# Patient Record
Sex: Female | Born: 1990 | Race: Black or African American | Hispanic: No | Marital: Single | Smoking: Former smoker
Health system: Southern US, Community
[De-identification: ages and names within clinical notes are randomized; demographics above are authoritative.]

## PROBLEM LIST (undated history)

## (undated) ENCOUNTER — Ambulatory Visit: Discharge status: Against Medical Advice | Payer: BC Managed Care – PPO

## (undated) DIAGNOSIS — F419 Anxiety disorder, unspecified: Secondary | ICD-10-CM

## (undated) DIAGNOSIS — Z22322 Carrier or suspected carrier of Methicillin resistant Staphylococcus aureus: Secondary | ICD-10-CM

## (undated) DIAGNOSIS — R12 Heartburn: Secondary | ICD-10-CM

## (undated) DIAGNOSIS — L299 Pruritus, unspecified: Secondary | ICD-10-CM

## (undated) DIAGNOSIS — K219 Gastro-esophageal reflux disease without esophagitis: Secondary | ICD-10-CM

## (undated) HISTORY — DX: Heartburn: R12

## (undated) HISTORY — DX: Morbid (severe) obesity due to excess calories: E66.01

## (undated) HISTORY — PX: DILATION AND CURETTAGE OF UTERUS: SHX78

## (undated) HISTORY — DX: Anxiety disorder, unspecified: F41.9

## (undated) HISTORY — PX: INCISION AND DRAINAGE: SHX5863

## (undated) HISTORY — DX: Pruritus, unspecified: L29.9

---

## 2010-09-06 ENCOUNTER — Ambulatory Visit (HOSPITAL_COMMUNITY)
Admission: RE | Admit: 2010-09-06 | Discharge: 2010-09-06 | Disposition: A | Discharge status: Home / Self Care | Payer: Self-pay | Source: Ambulatory Visit | Attending: Chiropractic Medicine | Admitting: Chiropractic Medicine

## 2010-09-06 ENCOUNTER — Other Ambulatory Visit (HOSPITAL_COMMUNITY): Payer: Self-pay | Admitting: Chiropractic Medicine

## 2010-09-06 DIAGNOSIS — R52 Pain, unspecified: Secondary | ICD-10-CM

## 2010-09-06 DIAGNOSIS — M542 Cervicalgia: Secondary | ICD-10-CM | POA: Insufficient documentation

## 2014-06-02 ENCOUNTER — Emergency Department (HOSPITAL_BASED_OUTPATIENT_CLINIC_OR_DEPARTMENT_OTHER)
Admission: EM | Admit: 2014-06-02 | Discharge: 2014-06-02 | Disposition: A | Discharge status: Home / Self Care | Payer: Medicaid Other | Attending: Emergency Medicine | Admitting: Emergency Medicine

## 2014-06-02 ENCOUNTER — Encounter (HOSPITAL_BASED_OUTPATIENT_CLINIC_OR_DEPARTMENT_OTHER): Payer: Self-pay | Admitting: *Deleted

## 2014-06-02 DIAGNOSIS — J029 Acute pharyngitis, unspecified: Secondary | ICD-10-CM | POA: Diagnosis not present

## 2014-06-02 DIAGNOSIS — Z3202 Encounter for pregnancy test, result negative: Secondary | ICD-10-CM | POA: Diagnosis not present

## 2014-06-02 DIAGNOSIS — M545 Low back pain: Secondary | ICD-10-CM | POA: Insufficient documentation

## 2014-06-02 DIAGNOSIS — K649 Unspecified hemorrhoids: Secondary | ICD-10-CM | POA: Insufficient documentation

## 2014-06-02 DIAGNOSIS — Z8614 Personal history of Methicillin resistant Staphylococcus aureus infection: Secondary | ICD-10-CM | POA: Insufficient documentation

## 2014-06-02 DIAGNOSIS — B370 Candidal stomatitis: Secondary | ICD-10-CM | POA: Diagnosis not present

## 2014-06-02 HISTORY — DX: Carrier or suspected carrier of methicillin resistant Staphylococcus aureus: Z22.322

## 2014-06-02 LAB — URINALYSIS, ROUTINE W REFLEX MICROSCOPIC
Bilirubin Urine: NEGATIVE
GLUCOSE, UA: NEGATIVE mg/dL
HGB URINE DIPSTICK: NEGATIVE
KETONES UR: 15 mg/dL — AB
Leukocytes, UA: NEGATIVE
Nitrite: NEGATIVE
PROTEIN: NEGATIVE mg/dL
Specific Gravity, Urine: 1.035 — ABNORMAL HIGH (ref 1.005–1.030)
Urobilinogen, UA: 1 mg/dL (ref 0.0–1.0)
pH: 6 (ref 5.0–8.0)

## 2014-06-02 LAB — PREGNANCY, URINE: Preg Test, Ur: NEGATIVE

## 2014-06-02 MED ORDER — IBUPROFEN 800 MG PO TABS
800.0000 mg | ORAL_TABLET | Freq: Once | ORAL | Status: AC
  Administered 2014-06-02: 800 mg via ORAL
  Filled 2014-06-02: qty 1

## 2014-06-02 MED ORDER — HYDROCORTISONE 2.5 % RE CREA: TOPICAL_CREAM | RECTAL | Status: DC

## 2014-06-02 MED ORDER — HYDROCODONE-ACETAMINOPHEN 5-325 MG PO TABS: 2.0000 | ORAL_TABLET | ORAL | Status: DC | PRN

## 2014-06-02 MED ORDER — FLUCONAZOLE 200 MG PO TABS: 200.0000 mg | ORAL_TABLET | Freq: Every day | ORAL | Status: AC

## 2014-06-02 MED ORDER — HYDROCODONE-ACETAMINOPHEN 5-325 MG PO TABS: 2.0000 | ORAL_TABLET | Freq: Once | ORAL | Status: DC

## 2014-06-02 NOTE — ED Notes (Signed)
Pt c/o Hemorid pain x 1 week

## 2014-06-02 NOTE — Discharge Instructions (Signed)

## 2014-06-02 NOTE — ED Provider Notes (Signed)
CSN: [id number]     Arrival date & time 06/02/14  1804 History   First MD Initiated Contact with Patient 06/02/14 1836     Chief Complaint  Patient presents with  . Hemorrhoids     (Consider location/radiation/quality/duration/timing/severity/associated sxs/prior Treatment) Patient is a 23 y.o. female presenting with pharyngitis. The history is provided by the patient. No language interpreter was used.  Sore Throat This is a new problem. The current episode started in the past 7 days. The problem occurs constantly. The problem has been unchanged. Pertinent negatives include no vomiting. Nothing aggravates the symptoms. She has tried nothing for the symptoms. The treatment provided no relief.   patient has multiple complaints she reports she feels like she has thrush on her tongue. Patient has had thrush in the past and feels the same. Patient reports she phased a prescription of antibiotics 3 weeks ago. Patient also complains of soreness in right side and right low back. Patient reports pain is worse when she moves. Patient also complains of an exacerbation of hemorrhoids. She has had hemorrhoids in the past. Patient reports hemorrhoids or worse than usual  Past Medical History  Diagnosis Date  . MRSA (methicillin resistant staph aureus) culture positive    History reviewed. No pertinent past surgical history. History reviewed. No pertinent family history. History  Substance Use Topics  . Smoking status: Never Smoker   . Smokeless tobacco: Not on file  . Alcohol Use: No   OB History    No data available     Review of Systems  Gastrointestinal: Negative for vomiting.  All other systems reviewed and are negative.     Allergies  Review of patient's allergies indicates no known allergies.  Home Medications   Prior to Admission medications   Not on File   BP 133/73 mmHg  Pulse 120  Temp(Src) 98.6 F (37 C) (Oral)  Resp 16  Ht 4\' 9"  (1.448 m)  Wt 230 lb (104.327 kg)   BMI 49.76 kg/m2  SpO2 100% Physical Exam  Constitutional: She is oriented to person, place, and time. She appears well-developed and well-nourished.  HENT:  Head: Normocephalic.  Tongue coated white throat is clear  Eyes: EOM are normal.  Neck: Normal range of motion.  Cardiovascular: Normal rate and normal heart sounds.   Pulmonary/Chest: Effort normal.  Abdominal: Soft. She exhibits no distension.  Genitourinary:  2 small hemorrhoids  Musculoskeletal:  Nontender abdomen nontender lumbar and thoracic spine and is reproducible by range of motion of low back.  Neurological: She is alert and oriented to person, place, and time.  Skin: Skin is warm.  Psychiatric: She has a normal mood and affect.  Nursing note and vitals reviewed.   ED Course  Procedures (including critical care time) Labs Review Labs Reviewed  URINALYSIS, ROUTINE W REFLEX MICROSCOPIC - Abnormal; Notable for the following:    Specific Gravity, Urine 1.035 (*)    Ketones, ur 15 (*)    All other components within normal limits  PREGNANCY, URINE    Imaging Review No results found.   EKG Interpretation None      MDM urine pregnancy is negative urinalysis is negative with the exception of 15 ketones. I counseled patient I will treat hemorrhoids with Anusol HC. She is given a prescription for Diflucan for thrush she is given a prescription for pain medicine.    Final diagnoses:  Hemorrhoids, unspecified hemorrhoid type    Patient advised to see her physician for recheck in one week  Lonia SkinnerLeslie K McKeansburgSofia, PA-C 06/02/14 2111  Mirian MoMatthew Gentry, MD 06/07/14 [phone]

## 2014-07-02 ENCOUNTER — Encounter (HOSPITAL_BASED_OUTPATIENT_CLINIC_OR_DEPARTMENT_OTHER): Payer: Self-pay | Admitting: *Deleted

## 2014-07-02 ENCOUNTER — Emergency Department (HOSPITAL_BASED_OUTPATIENT_CLINIC_OR_DEPARTMENT_OTHER)
Admission: EM | Admit: 2014-07-02 | Discharge: 2014-07-02 | Disposition: A | Discharge status: Home / Self Care | Payer: Medicaid Other | Attending: Emergency Medicine | Admitting: Emergency Medicine

## 2014-07-02 DIAGNOSIS — Z79899 Other long term (current) drug therapy: Secondary | ICD-10-CM | POA: Insufficient documentation

## 2014-07-02 DIAGNOSIS — Z8614 Personal history of Methicillin resistant Staphylococcus aureus infection: Secondary | ICD-10-CM | POA: Insufficient documentation

## 2014-07-02 DIAGNOSIS — L0231 Cutaneous abscess of buttock: Secondary | ICD-10-CM | POA: Insufficient documentation

## 2014-07-02 DIAGNOSIS — L089 Local infection of the skin and subcutaneous tissue, unspecified: Secondary | ICD-10-CM | POA: Diagnosis present

## 2014-07-02 DIAGNOSIS — Z7952 Long term (current) use of systemic steroids: Secondary | ICD-10-CM | POA: Diagnosis not present

## 2014-07-02 MED ORDER — OXYCODONE-ACETAMINOPHEN 5-325 MG PO TABS: 1.0000 | ORAL_TABLET | Freq: Four times a day (QID) | ORAL | Status: DC | PRN

## 2014-07-02 MED ORDER — SULFAMETHOXAZOLE-TRIMETHOPRIM 800-160 MG PO TABS: 1.0000 | ORAL_TABLET | Freq: Two times a day (BID) | ORAL | Status: DC

## 2014-07-02 NOTE — Discharge Instructions (Signed)
Return to the ED with any concerns including fever/chills, increased swelling, vomiting and not able to keep down antibiotics, decreased level of alertness/lethargy, or any other alarming symptoms

## 2014-07-02 NOTE — ED Notes (Signed)
Pt amb to room 10 with quick steady gait, reports "boil" to her left buttock x 3 weeks. Pt states over last few days has gotten larger, more painful, and some drainage noted. Denies any fevers or other c/o.

## 2014-07-02 NOTE — ED Provider Notes (Signed)
CSN: [id number]     Arrival date & time 07/02/14  [id number] History   First MD Initiated Contact with Patient 07/02/14 1002     Chief Complaint  Patient presents with  . Recurrent Skin Infections     (Consider location/radiation/quality/duration/timing/severity/associated sxs/prior Treatment) HPI  Pt presents with c/o boil on her left buttock.  She states the area has been present for approx 2 weeks, the area has just begun to drain- pus mixed with blood.  She has been using warm compresses.  No fever/chills.  No systemic symptoms.  Pain is constant, but worse with palpation. No pain with defecation.  There are no other associated systemic symptoms, there are no other alleviating or modifying factors.   Past Medical History  Diagnosis Date  . MRSA (methicillin resistant staph aureus) culture positive    History reviewed. No pertinent past surgical history. History reviewed. No pertinent family history. History  Substance Use Topics  . Smoking status: Never Smoker   . Smokeless tobacco: Not on file  . Alcohol Use: No   OB History    No data available     Review of Systems  ROS reviewed and all otherwise negative except for mentioned in HPI    Allergies  Review of patient's allergies indicates no known allergies.  Home Medications   Prior to Admission medications   Medication Sig Start Date End Date Taking? Authorizing Provider  HYDROcodone-acetaminophen (NORCO/VICODIN) 5-325 MG per tablet Take 2 tablets by mouth every 4 (four) hours as needed. 06/02/14   Elson AreasLeslie K Sofia, PA-C  hydrocortisone (ANUSOL-HC) 2.5 % rectal cream Apply rectally 2 times daily 06/02/14   Elson AreasLeslie K Sofia, PA-C  oxyCODONE-acetaminophen (PERCOCET/ROXICET) 5-325 MG per tablet Take 1-2 tablets by mouth every 6 (six) hours as needed for severe pain. 07/02/14   Ethelda ChickMartha K Linker, MD  sulfamethoxazole-trimethoprim (SEPTRA DS) 800-160 MG per tablet Take 1 tablet by mouth every 12 (twelve) hours. 07/02/14   Ethelda ChickMartha K  Linker, MD   BP 130/73 mmHg  Pulse 81  Temp(Src) 99 F (37.2 C) (Oral)  Resp 18  Ht 5' (1.524 m)  Wt 230 lb (104.327 kg)  BMI 44.92 kg/m2  SpO2 97%  Vitals reviewed Physical Exam  Physical Examination: General appearance - alert, well appearing, and in no distress Mental status - alert, oriented to person, place, and time Eyes -  No conjunctival injection, no scleral icterus Chest - clear to auscultation, no wheezes, rales or rhonchi, symmetric air entry Heart - normal rate, regular rhythm, normal S1, S2, no murmurs, rubs, clicks or gallops Abdomen - soft, nontender, nondistended, no masses or organomegaly Back exam - full range of motion, no tenderness, palpable spasm or pain on motion Extremities - peripheral pulses normal, no pedal edema, no clubbing or cyanosis Skin - normal coloration and turgor, no rashes, approx 1cm erythematous area on left buttock- area is firm, actively drainage pus and blood, tender to palpation  ED Course  Procedures (including critical care time) Labs Review Labs Reviewed - No data to display  Imaging Review No results found.   EKG Interpretation None      MDM   Final diagnoses:  Abscess of left buttock    Pt presenting with c/o abscess on left buttock- actively draining small amount of pus. No fever or systemic symptoms.  Some overlying cellulitis.  Pt given pain meds and antibiotics, she is to continue using warm compresses.  Discharged with strict return precautions.  Pt agreeable with plan.  Ethelda ChickMartha K Linker, MD 07/03/14 [phone]

## 2016-08-23 ENCOUNTER — Emergency Department (HOSPITAL_COMMUNITY)
Admission: EM | Admit: 2016-08-23 | Discharge: 2016-08-23 | Disposition: A | Discharge status: Home / Self Care | Payer: Medicaid Other | Attending: Emergency Medicine | Admitting: Emergency Medicine

## 2016-08-23 ENCOUNTER — Encounter (HOSPITAL_COMMUNITY): Payer: Self-pay | Admitting: Emergency Medicine

## 2016-08-23 DIAGNOSIS — R1012 Left upper quadrant pain: Secondary | ICD-10-CM

## 2016-08-23 DIAGNOSIS — R350 Frequency of micturition: Secondary | ICD-10-CM | POA: Insufficient documentation

## 2016-08-23 DIAGNOSIS — F17210 Nicotine dependence, cigarettes, uncomplicated: Secondary | ICD-10-CM | POA: Insufficient documentation

## 2016-08-23 DIAGNOSIS — K5900 Constipation, unspecified: Secondary | ICD-10-CM | POA: Insufficient documentation

## 2016-08-23 DIAGNOSIS — R110 Nausea: Secondary | ICD-10-CM | POA: Insufficient documentation

## 2016-08-23 DIAGNOSIS — R1032 Left lower quadrant pain: Secondary | ICD-10-CM | POA: Insufficient documentation

## 2016-08-23 LAB — COMPREHENSIVE METABOLIC PANEL
ALBUMIN: 4.1 g/dL (ref 3.5–5.0)
ALK PHOS: 68 U/L (ref 38–126)
ALT: 17 U/L (ref 14–54)
ANION GAP: 9 (ref 5–15)
AST: 19 U/L (ref 15–41)
BILIRUBIN TOTAL: 0.6 mg/dL (ref 0.3–1.2)
BUN: 8 mg/dL (ref 6–20)
CO2: 24 mmol/L (ref 22–32)
Calcium: 9.3 mg/dL (ref 8.9–10.3)
Chloride: 106 mmol/L (ref 101–111)
Creatinine, Ser: 0.54 mg/dL (ref 0.44–1.00)
GFR calc Af Amer: >60 mL/min (ref >60)
GFR calc non Af Amer: >60 mL/min (ref >60)
Glucose, Bld: 89 mg/dL (ref 65–99)
POTASSIUM: 3.7 mmol/L (ref 3.5–5.1)
SODIUM: 139 mmol/L (ref 135–145)
Total Protein: 7.5 g/dL (ref 6.5–8.1)

## 2016-08-23 LAB — URINALYSIS, ROUTINE W REFLEX MICROSCOPIC
Bilirubin Urine: NEGATIVE
Glucose, UA: NEGATIVE mg/dL
Hgb urine dipstick: NEGATIVE
Ketones, ur: NEGATIVE mg/dL
LEUKOCYTES UA: NEGATIVE
NITRITE: NEGATIVE
Protein, ur: NEGATIVE mg/dL
SPECIFIC GRAVITY, URINE: 1.028 (ref 1.005–1.030)
pH: 6 (ref 5.0–8.0)

## 2016-08-23 LAB — CBC
HCT: 40.7 % (ref 36.0–46.0)
HEMOGLOBIN: 13.7 g/dL (ref 12.0–15.0)
MCH: 31.7 pg (ref 26.0–34.0)
MCHC: 33.7 g/dL (ref 30.0–36.0)
MCV: 94.2 fL (ref 78.0–100.0)
Platelets: 317 10*3/uL (ref 150–400)
RBC: 4.32 MIL/uL (ref 3.87–5.11)
RDW: 12.6 % (ref 11.5–15.5)
WBC: 7.6 10*3/uL (ref 4.0–10.5)

## 2016-08-23 LAB — PREGNANCY, URINE: PREG TEST UR: NEGATIVE

## 2016-08-23 LAB — LIPASE, BLOOD: Lipase: 11 U/L (ref 11–51)

## 2016-08-23 MED ORDER — TRAMADOL HCL 50 MG PO TABS: 50.0000 mg | ORAL_TABLET | Freq: Four times a day (QID) | ORAL | 0 refills | Status: DC | PRN

## 2016-08-23 MED ORDER — IBUPROFEN 600 MG PO TABS: 600.0000 mg | ORAL_TABLET | Freq: Four times a day (QID) | ORAL | 0 refills | Status: DC

## 2016-08-23 MED ORDER — IBUPROFEN 800 MG PO TABS
800.0000 mg | ORAL_TABLET | Freq: Once | ORAL | Status: AC
  Administered 2016-08-23: 800 mg via ORAL
  Filled 2016-08-23: qty 1

## 2016-08-23 MED ORDER — TRAMADOL HCL 50 MG PO TABS
100.0000 mg | ORAL_TABLET | Freq: Once | ORAL | Status: AC
  Administered 2016-08-23: 100 mg via ORAL
  Filled 2016-08-23: qty 2

## 2016-08-23 MED ORDER — ONDANSETRON HCL 4 MG PO TABS
4.0000 mg | ORAL_TABLET | Freq: Once | ORAL | Status: AC
  Administered 2016-08-23: 4 mg via ORAL
  Filled 2016-08-23: qty 1

## 2016-08-23 NOTE — ED Provider Notes (Signed)
[facility] Provider Note   CSN: [id number] Arrival date & time: 08/23/16  1721     History   Chief Complaint Chief Complaint  Patient presents with  . Abdominal Pain    HPI Alexis Berg is a 26 y.o. female.  Patients describe the abdomen pain as a cramping sharp type pain. The pain is mostly in the left lower quadrant moving to the left lower back.  The patient reports she had her last menstrual cycle 2 weeks ago. It is of note however that her menses are irregular due to Depo-Provera.   The history is provided by the patient.  Abdominal Pain   This is a new problem. The current episode started more than 2 days ago. The problem occurs hourly. The problem has been gradually worsening. The pain is associated with an unknown factor. The pain is located in the LLQ (left lower back). The pain is moderate. Associated symptoms include nausea, constipation, frequency and myalgias. Pertinent negatives include fever, belching, diarrhea, vomiting, dysuria, hematuria and arthralgias. The symptoms are aggravated by certain positions. Nothing relieves the symptoms. Her past medical history does not include PUD, gallstones, ulcerative colitis, Crohn's disease or irritable bowel syndrome.    Past Medical History:  Diagnosis Date  . MRSA (methicillin resistant staph aureus) culture positive     There are no active problems to display for this patient.   History reviewed. No pertinent surgical history.  OB History    No data available       Home Medications    Prior to Admission medications   Medication Sig Start Date End Date Taking? Authorizing Provider  HYDROcodone-acetaminophen (NORCO/VICODIN) 5-325 MG per tablet Take 2 tablets by mouth every 4 (four) hours as needed. 06/02/14   Elson AreasLeslie K Sofia, PA-C  hydrocortisone (ANUSOL-HC) 2.5 % rectal cream Apply rectally 2 times daily 06/02/14   Elson AreasLeslie K Sofia, PA-C  oxyCODONE-acetaminophen (PERCOCET/ROXICET) 5-325 MG per  tablet Take 1-2 tablets by mouth every 6 (six) hours as needed for severe pain. 07/02/14   Jerelyn ScottMartha Linker, MD  sulfamethoxazole-trimethoprim (SEPTRA DS) 800-160 MG per tablet Take 1 tablet by mouth every 12 (twelve) hours. 07/02/14   Jerelyn ScottMartha Linker, MD    Family History History reviewed. No pertinent family history.  Social History Social History  Substance Use Topics  . Smoking status: Current Every Day Smoker    Packs/day: 0.50    Types: Cigarettes  . Smokeless tobacco: Never Used  . Alcohol use No     Allergies   Patient has no known allergies.   Review of Systems Review of Systems  Constitutional: Negative for activity change and fever.       All ROS Neg except as noted in HPI  HENT: Negative for nosebleeds.   Eyes: Negative for photophobia and discharge.  Respiratory: Negative for cough, shortness of breath and wheezing.   Cardiovascular: Negative for chest pain and palpitations.  Gastrointestinal: Positive for abdominal pain, constipation and nausea. Negative for blood in stool, diarrhea and vomiting.  Genitourinary: Positive for frequency. Negative for dysuria and hematuria.  Musculoskeletal: Positive for myalgias. Negative for arthralgias, back pain and neck pain.  Skin: Negative.   Neurological: Negative for dizziness, seizures and speech difficulty.  Psychiatric/Behavioral: Negative for confusion and hallucinations.     Physical Exam Updated Vital Signs BP 122/79 (BP Location: Right Arm)   Pulse 97   Temp 98.2 F (36.8 C)   Resp 16   Ht 5' (1.524 m)   Wt 108.9 kg  LMP 08/07/2016   SpO2 100%   BMI 46.87 kg/m   Physical Exam  Constitutional: She is oriented to person, place, and time. She appears well-developed and well-nourished.  Non-toxic appearance.  HENT:  Head: Normocephalic.  Right Ear: Tympanic membrane and external ear normal.  Left Ear: Tympanic membrane and external ear normal.  Eyes: EOM and lids are normal. Pupils are equal, round, and  reactive to light.  Neck: Normal range of motion. Neck supple. Carotid bruit is not present.  Cardiovascular: Normal rate, regular rhythm, normal heart sounds, intact distal pulses and normal pulses.   Pulmonary/Chest: Breath sounds normal. No respiratory distress.  Abdominal: Soft. Bowel sounds are normal. There is tenderness in the suprapubic area and left lower quadrant. There is no guarding.    Musculoskeletal: Normal range of motion.  Lymphadenopathy:       Head (right side): No submandibular adenopathy present.       Head (left side): No submandibular adenopathy present.    She has no cervical adenopathy.  Neurological: She is alert and oriented to person, place, and time. She has normal strength. No cranial nerve deficit or sensory deficit.  Skin: Skin is warm and dry.  Psychiatric: She has a normal mood and affect. Her speech is normal.  Nursing note and vitals reviewed.    ED Treatments / Results  Labs (all labs ordered are listed, but only abnormal results are displayed) Labs Reviewed  URINALYSIS, ROUTINE W REFLEX MICROSCOPIC - Abnormal; Notable for the following:       Result Value   APPearance HAZY (*)    All other components within normal limits  CBC  PREGNANCY, URINE  LIPASE, BLOOD  COMPREHENSIVE METABOLIC PANEL    EKG  EKG Interpretation None       Radiology No results found.  Procedures Procedures (including critical care time)  Medications Ordered in ED Medications - No data to display   Initial Impression / Assessment and Plan / ED Course  I have reviewed the triage vital signs and the nursing notes.  Pertinent labs & imaging results that were available during my care of the patient were reviewed by me and considered in my medical decision making (see chart for details).     **I have reviewed nursing notes, vital signs, and all appropriate lab and imaging results for this patient.*  Final Clinical Impressions(s) / ED Diagnoses  MDM Vital  signs within normal limits. The lipase is normal at 11. Comments of metabolic panel is normal. Complete blood count is well within normal limits. Urinalysis is negative for infection or evidence of kidney stone or other abnormality. Urine pregnancy test is negative.  I suspect that this is either a musculoskeletal pain, or hormonal related he specially with the patient being on Depo-Provera. The patient will be treated with ibuprofen and Ultram for discomfort. I've asked her to see her GYN physician for additional evaluation if this is not improving, or to return to the emergency department if any emergent changes, problems, or concerns.    Final diagnoses:  None    New Prescriptions New Prescriptions   No medications on file     Ivery Quale, Cordelia Poche 08/23/16 2129    Mancel Bale, MD 08/24/16 1946

## 2016-08-23 NOTE — ED Triage Notes (Signed)
Pt reports intermittent abd cramps. Pt denies any dysuria,hematuria, vaginal bleeding,v/d. nad noted. LMP x2 weeks ago.

## 2016-08-23 NOTE — ED Notes (Signed)
Pt ambulatory to waiting room. Pt verbalized understanding of discharge instructions.   

## 2016-08-23 NOTE — Discharge Instructions (Signed)
Your vital signs within normal limits. Your chemistries are normal. Your urine is negative for infection or kidney stone. Your pregnancy test is negative. Please use ibuprofen 4 times daily with meals and at bedtime, use Ultram every 6 hours for more severe pain. See your GYN physician for additional evaluation and management if this is not improving. Return to the emergency department if any emergent changes, problems, or concerns.

## 2016-11-06 ENCOUNTER — Encounter (HOSPITAL_COMMUNITY): Payer: Self-pay | Admitting: Cardiology

## 2016-11-06 ENCOUNTER — Emergency Department (HOSPITAL_COMMUNITY)
Admission: EM | Admit: 2016-11-06 | Discharge: 2016-11-06 | Disposition: A | Discharge status: Home / Self Care | Payer: Medicaid Other | Attending: Emergency Medicine | Admitting: Emergency Medicine

## 2016-11-06 DIAGNOSIS — A599 Trichomoniasis, unspecified: Secondary | ICD-10-CM

## 2016-11-06 DIAGNOSIS — Z87891 Personal history of nicotine dependence: Secondary | ICD-10-CM | POA: Insufficient documentation

## 2016-11-06 DIAGNOSIS — Z79899 Other long term (current) drug therapy: Secondary | ICD-10-CM | POA: Insufficient documentation

## 2016-11-06 DIAGNOSIS — R300 Dysuria: Secondary | ICD-10-CM

## 2016-11-06 DIAGNOSIS — Z791 Long term (current) use of non-steroidal anti-inflammatories (NSAID): Secondary | ICD-10-CM | POA: Insufficient documentation

## 2016-11-06 LAB — URINALYSIS, ROUTINE W REFLEX MICROSCOPIC
BILIRUBIN URINE: NEGATIVE
Bacteria, UA: NONE SEEN
Glucose, UA: NEGATIVE mg/dL
Hgb urine dipstick: NEGATIVE
Ketones, ur: NEGATIVE mg/dL
Nitrite: NEGATIVE
Protein, ur: NEGATIVE mg/dL
SPECIFIC GRAVITY, URINE: 1.024 (ref 1.005–1.030)
pH: 5 (ref 5.0–8.0)

## 2016-11-06 LAB — PREGNANCY, URINE: PREG TEST UR: NEGATIVE

## 2016-11-06 LAB — WET PREP, GENITAL
SPERM: NONE SEEN
Yeast Wet Prep HPF POC: NONE SEEN

## 2016-11-06 MED ORDER — LIDOCAINE HCL (PF) 1 % IJ SOLN
INTRAMUSCULAR | Status: AC
  Filled 2016-11-06: qty 5

## 2016-11-06 MED ORDER — METRONIDAZOLE 500 MG PO TABS
2000.0000 mg | ORAL_TABLET | Freq: Once | ORAL | Status: AC
  Administered 2016-11-06: 2000 mg via ORAL
  Filled 2016-11-06: qty 4

## 2016-11-06 MED ORDER — AZITHROMYCIN 250 MG PO TABS
1000.0000 mg | ORAL_TABLET | Freq: Once | ORAL | Status: AC
  Administered 2016-11-06: 1000 mg via ORAL
  Filled 2016-11-06: qty 4

## 2016-11-06 MED ORDER — CEFTRIAXONE SODIUM 250 MG IJ SOLR
250.0000 mg | Freq: Once | INTRAMUSCULAR | Status: AC
  Administered 2016-11-06: 250 mg via INTRAMUSCULAR
  Filled 2016-11-06: qty 250

## 2016-11-06 NOTE — ED Provider Notes (Signed)
[facility] Provider Note   CSN: [id number] Arrival date & time: 11/06/16  1023     History   Chief Complaint Chief Complaint  Patient presents with  . Dysuria    HPI Alexis Berg is a 26 y.o. female.  HPI   Alexis DanceJohnele Alexis Berg is a 26 y.o. female who presents to the Emergency Department complaining of burning with urination, urinary frequency and vaginal itching for 3-4 days.  She states that she had unprotected sex with a new partner prior to onset of symptoms.  She states the vaginal itching is constant and worse after urination.  She denies abdominal pain, vaginal bleeding or discharge, rash, fever, back pain or chills.    Past Medical History:  Diagnosis Date  . MRSA (methicillin resistant staph aureus) culture positive     There are no active problems to display for this patient.   History reviewed. No pertinent surgical history.  OB History    No data available       Home Medications    Prior to Admission medications   Medication Sig Start Date End Date Taking? Authorizing Provider  ibuprofen (ADVIL,MOTRIN) 600 MG tablet Take 1 tablet (600 mg total) by mouth 4 (four) times daily. Patient not taking: Reported on 11/06/2016 08/23/16   Ivery QualeBryant, Hobson, PA-C  traMADol (ULTRAM) 50 MG tablet Take 1 tablet (50 mg total) by mouth every 6 (six) hours as needed. Patient not taking: Reported on 11/06/2016 08/23/16   Ivery QualeBryant, Hobson, PA-C    Family History History reviewed. No pertinent family history.  Social History Social History  Substance Use Topics  . Smoking status: Former Games developermoker  . Smokeless tobacco: Never Used  . Alcohol use No     Allergies   Patient has no known allergies.   Review of Systems Review of Systems  Constitutional: Negative for activity change, appetite change, chills and fever.  Respiratory: Negative for chest tightness and shortness of breath.   Gastrointestinal: Negative for abdominal pain, nausea and vomiting.    Genitourinary: Positive for dysuria, frequency and vaginal pain. Negative for decreased urine volume, difficulty urinating, flank pain, hematuria, urgency, vaginal bleeding and vaginal discharge.       Burning with urination.  Musculoskeletal: Negative for back pain.  Skin: Negative for rash.  Neurological: Negative for dizziness, weakness and numbness.  Hematological: Negative for adenopathy.  Psychiatric/Behavioral: Negative for confusion.  All other systems reviewed and are negative.    Physical Exam Updated Vital Signs BP 124/80   Pulse 71   Temp 98.3 F (36.8 C) (Oral)   Resp 16   Ht 5' (1.524 m)   Wt 103.9 kg   LMP 10/31/2016   SpO2 99%   BMI 44.72 kg/m   Physical Exam  Constitutional: She is oriented to person, place, and time. She appears well-developed and well-nourished. No distress.  HENT:  Head: Normocephalic and atraumatic.  Cardiovascular: Normal rate, regular rhythm and intact distal pulses.   No murmur heard. Pulmonary/Chest: Effort normal and breath sounds normal. No respiratory distress. She has no wheezes. She has no rales.  Abdominal: Soft. Normal appearance. She exhibits no distension and no mass. There is no hepatosplenomegaly. There is no tenderness. There is no rigidity, no rebound, no guarding, no CVA tenderness and no tenderness at McBurney's point.  abdomen is soft, non-tender without guarding or rebound tenderness. No CVA tenderness  Genitourinary: Vagina normal and uterus normal. There is no rash or lesion on the right labia. There is no rash or lesion  on the left labia. Cervix exhibits no motion tenderness and no discharge. Right adnexum displays no mass and no tenderness. Left adnexum displays no mass and no tenderness. No tenderness or bleeding in the vagina. No foreign body in the vagina. No vaginal discharge found.  Genitourinary Comments: Exam chaperoned by nursing.  Small amt of white milky vaginal discharge.  No CMT, no adnexal masses or  tenderness  Musculoskeletal: Normal range of motion. She exhibits no edema.  Neurological: She is alert and oriented to person, place, and time. Coordination normal.  Skin: Skin is warm and dry. No rash noted.  Psychiatric: She has a normal mood and affect.  Nursing note and vitals reviewed.    ED Treatments / Results  Labs (all labs ordered are listed, but only abnormal results are displayed) Labs Reviewed  WET PREP, GENITAL - Abnormal; Notable for the following:       Result Value   Trich, Wet Prep PRESENT (*)    Clue Cells Wet Prep HPF POC PRESENT (*)    WBC, Wet Prep HPF POC MANY (*)    All other components within normal limits  URINALYSIS, ROUTINE W REFLEX MICROSCOPIC - Abnormal; Notable for the following:    APPearance HAZY (*)    Leukocytes, UA MODERATE (*)    Squamous Epithelial / LPF 0-5 (*)    All other components within normal limits  URINE CULTURE  PREGNANCY, URINE  RPR  HIV ANTIBODY (ROUTINE TESTING)  GC/CHLAMYDIA PROBE AMP ([place]) NOT AT [facility]    EKG  EKG Interpretation None       Radiology No results found.  Procedures Procedures (including critical care time)  Medications Ordered in ED Medications  lidocaine (PF) (XYLOCAINE) 1 % injection (not administered)  cefTRIAXone (ROCEPHIN) injection 250 mg (250 mg Intramuscular Given 11/06/16 1325)  azithromycin (ZITHROMAX) tablet 1,000 mg (1,000 mg Oral Given 11/06/16 1325)  metroNIDAZOLE (FLAGYL) tablet 2,000 mg (2,000 mg Oral Given 11/06/16 1409)     Initial Impression / Assessment and Plan / ED Course  I have reviewed the triage vital signs and the nursing notes.  Pertinent labs & imaging results that were available during my care of the patient were reviewed by me and considered in my medical decision making (see chart for details).     Pt is well appearing.  Non-toxic.  abd is soft, NT.  Dysuria sx's likely related to STI, will tx with IM rocephin, po zithromax and flagyl.  Pt agrees to f/u with  health dept.  Urine culture pending.   Final Clinical Impressions(s) / ED Diagnoses   Final diagnoses:  Trichimoniasis  Dysuria    New Prescriptions Discharge Medication List as of 11/06/2016  2:02 PM       Pauline Aus, PA-C 11/06/16 1630    Eber Hong, MD 11/12/16 2023

## 2016-11-06 NOTE — Discharge Instructions (Signed)
Follow-up with the health dept for recheck if needed.  You will be contacted by the hospital if any of your remaining tests are positive.

## 2016-11-06 NOTE — ED Triage Notes (Signed)
Frequent urination,  Vaginal burning and itching times 3-4 days.

## 2016-11-07 LAB — URINE CULTURE

## 2016-11-07 LAB — GC/CHLAMYDIA PROBE AMP (~~LOC~~) NOT AT ARMC
Chlamydia: NEGATIVE
Neisseria Gonorrhea: NEGATIVE

## 2016-11-07 LAB — HIV ANTIBODY (ROUTINE TESTING W REFLEX): HIV SCREEN 4TH GENERATION: NONREACTIVE

## 2016-11-07 LAB — RPR: RPR Ser Ql: NONREACTIVE

## 2016-11-15 ENCOUNTER — Emergency Department (HOSPITAL_COMMUNITY): Payer: Self-pay

## 2016-11-15 ENCOUNTER — Emergency Department (HOSPITAL_COMMUNITY)
Admission: EM | Admit: 2016-11-15 | Discharge: 2016-11-15 | Disposition: A | Discharge status: Home / Self Care | Payer: Self-pay | Attending: Emergency Medicine | Admitting: Emergency Medicine

## 2016-11-15 ENCOUNTER — Encounter (HOSPITAL_COMMUNITY): Payer: Self-pay | Admitting: *Deleted

## 2016-11-15 DIAGNOSIS — B349 Viral infection, unspecified: Secondary | ICD-10-CM | POA: Insufficient documentation

## 2016-11-15 DIAGNOSIS — Z87891 Personal history of nicotine dependence: Secondary | ICD-10-CM | POA: Insufficient documentation

## 2016-11-15 DIAGNOSIS — R05 Cough: Secondary | ICD-10-CM

## 2016-11-15 DIAGNOSIS — R059 Cough, unspecified: Secondary | ICD-10-CM

## 2016-11-15 LAB — RAPID STREP SCREEN (MED CTR MEBANE ONLY): Streptococcus, Group A Screen (Direct): NEGATIVE

## 2016-11-15 MED ORDER — ONDANSETRON 4 MG PO TBDP
4.0000 mg | ORAL_TABLET | Freq: Once | ORAL | Status: AC
  Administered 2016-11-15: 4 mg via ORAL
  Filled 2016-11-15: qty 1

## 2016-11-15 MED ORDER — IBUPROFEN 800 MG PO TABS
800.0000 mg | ORAL_TABLET | Freq: Once | ORAL | Status: AC
  Administered 2016-11-15: 800 mg via ORAL
  Filled 2016-11-15: qty 1

## 2016-11-15 MED ORDER — ACETAMINOPHEN 500 MG PO TABS
500.0000 mg | ORAL_TABLET | Freq: Once | ORAL | Status: AC
  Administered 2016-11-15: 500 mg via ORAL
  Filled 2016-11-15: qty 1

## 2016-11-15 NOTE — ED Provider Notes (Signed)
The patient is a 26 year old female, presents with symptoms that started yesterday including sore throat, coughing, body aches. She has had several family members with strep throat. On exam the patient is sleeping comfortably, easily arousable and wakes up to have a normal mental status. She is minimally tachycardic, soft abdomen, mild tenderness in her muscles diffusely. She has normal phonation, moist mucous membranes, minimal erythema but no exudate asymmetry or hypertrophy to the posterior pharynx. Nasal passages are clear, there is no lymphadenopathy in her neck is extremely supple with no pain or tenderness with range of motion.  Rapid strep is negative, the patient is stable for discharge, she'll be treated with anti-inflammatories as an outpatient. She can follow-up closely, rapid strep culture has been sent.  Medical screening examination/treatment/procedure(s) were conducted as a shared visit with non-physician practitioner(s) and myself.  I personally evaluated the patient during the encounter.  Clinical Impression:   Final diagnoses:  Viral syndrome  Cough         Eber HongMiller, Mckenleigh Tarlton, MD 11/16/16 [phone]

## 2016-11-15 NOTE — Discharge Instructions (Signed)
Your chest xray and strep was negative. Continue taking ibuprofen for fever and pain. Follow up with your PCP within the next week for recheck of symptoms. You can return if symptoms worsen.

## 2016-11-15 NOTE — ED Provider Notes (Signed)
[facility]-EMERGENCY DEPT Provider Note   CSN: [id number] Arrival date & time: 11/15/16  2041   By signing my name below, I, Bobbie StackChristopher Reid, attest that this documentation has been prepared under the direction and in the presence of SPX CorporationMichael Vora Clover, PA-C. Electronically Signed: Bobbie Stackhristopher Reid, Scribe. 11/15/16. 9:23 PM. History   Chief Complaint Chief Complaint  Patient presents with  . Generalized Body Aches    The history is provided by the patient. No language interpreter was used.  HPI Comments: Alexis Berg is a 26 y.o. female who presents to the Emergency Department complaining of generalized body aches that began yesterday morning. She reports associated fever of tmax 102, sore throat, frontal headache with throbbing in character, back pain, centralized chest pain, and SOB that began yesterday. She also reports a productive cough of whitish-yellow sputum. She reports noticing some streaking blood in her cough this morning. She reports some nausea recently but no vomiting. She took 650 mg of tylenol yesterday and today with no relief. No intake since this morning. She states that her son had strep throat the other day and was treated. She states that she has been around family that lives in the same household which has been sick recently. She denies current tobacco use. She denies blood in stool or leg swelling recently.  Past Medical History:  Diagnosis Date  . MRSA (methicillin resistant staph aureus) culture positive     There are no active problems to display for this patient.   History reviewed. No pertinent surgical history.  OB History    No data available       Home Medications    Prior to Admission medications   Medication Sig Start Date End Date Taking? Authorizing Provider  ibuprofen (ADVIL,MOTRIN) 600 MG tablet Take 1 tablet (600 mg total) by mouth 4 (four) times daily. Patient not taking: Reported on 11/06/2016 08/23/16   Ivery QualeBryant, Hobson, PA-C  traMADol  (ULTRAM) 50 MG tablet Take 1 tablet (50 mg total) by mouth every 6 (six) hours as needed. Patient not taking: Reported on 11/06/2016 08/23/16   Ivery QualeBryant, Hobson, PA-C    Family History History reviewed. No pertinent family history.  Social History Social History  Substance Use Topics  . Smoking status: Former Games developermoker  . Smokeless tobacco: Never Used  . Alcohol use No     Allergies   Patient has no known allergies.   Review of Systems Review of Systems  Constitutional: Positive for fever.  HENT: Positive for sore throat.   Respiratory: Positive for cough and shortness of breath.   Cardiovascular: Positive for chest pain. Negative for leg swelling.  Gastrointestinal: Negative for blood in stool.  Musculoskeletal: Positive for myalgias (Generalized body aches).  Neurological: Positive for headaches.  All other systems reviewed and are negative.   Physical Exam Updated Vital Signs BP (!) 90/58 (BP Location: Left Arm)   Pulse (!) 109   Temp 99.3 F (37.4 C) (Oral)   Resp 19   Ht 5' (1.524 m)   Wt 103.9 kg   LMP 10/31/2016   SpO2 96%   BMI 44.72 kg/m   Physical Exam  Constitutional: She appears well-developed and well-nourished.  HENT:  Head: Normocephalic and atraumatic.  She has some erythema of the posterior pharynx. Normal TMs cone of light seen. No sinus tenderness. Shotty cervical lymphadenopathy.  Eyes: Conjunctivae are normal. Right eye exhibits no discharge. Left eye exhibits no discharge.  Cardiovascular: Regular rhythm and normal heart sounds.  Tachycardia present.   Hearts  sounds were normal but she is tachycardic. Radial pulses are equal bilaterally. PT pulses equal bilaterally.  Pulmonary/Chest: Effort normal. No respiratory distress. She has rhonchi. She exhibits tenderness.  TTP of the right upper side. Rhonchi bilaterally that doesn't clear with coughing.  Abdominal: Soft. Bowel sounds are normal. She exhibits no mass.  Abdomen is soft, non-tender. Normal  bowel sounds in all 4 quadrants. No masses.  Musculoskeletal:  No cervical tenderness. No pitting edema. Grossly moves all extremities.   Neurological: She is alert. Coordination normal.  Skin: Skin is warm and dry. No rash noted. She is not diaphoretic. No erythema.  Psychiatric: She has a normal mood and affect.  Nursing note and vitals reviewed.  ED Treatments / Results  DIAGNOSTIC STUDIES: Oxygen Saturation is 98% on RA, normal by my interpretation.    COORDINATION OF CARE: 8:59 PM Discussed treatment plan with pt at bedside and pt agreed to plan. I will check her labs.  Labs (all labs ordered are listed, but only abnormal results are displayed) Labs Reviewed  RAPID STREP SCREEN (NOT AT [facility])  CULTURE, GROUP A STREP [facility])    EKG  EKG Interpretation None       Radiology Dg Chest 2 View  Result Date: 11/15/2016 CLINICAL DATA:  Mid chest pain with productive cough EXAM: CHEST  2 VIEW COMPARISON:  None. FINDINGS: The heart size and mediastinal contours are within normal limits. Both lungs are clear. The visualized skeletal structures are unremarkable. IMPRESSION: No active cardiopulmonary disease. Electronically Signed   By: Jasmine Pang M.D.   On: 11/15/2016 21:57    Procedures Procedures (including critical care time)  Medications Ordered in ED Medications  ibuprofen (ADVIL,MOTRIN) tablet 800 mg (800 mg Oral Given 11/15/16 2142)  ondansetron (ZOFRAN-ODT) disintegrating tablet 4 mg (4 mg Oral Given 11/15/16 2142)  acetaminophen (TYLENOL) tablet 500 mg (500 mg Oral Given 11/15/16 2142)     Initial Impression / Assessment and Plan / ED Course  I have reviewed the triage vital signs and the nursing notes.  Pertinent labs & imaging results that were available during my care of the patient were reviewed by me and considered in my medical decision making (see chart for details).     As she has sick contacts at home, febrile a rapid strep was preformed. Her lung sounds  warranted a chest xray. Both results were negative. Ibuprofen and tylenol was given to reduce the patients fever during her stay in the emergency department. She was advised she could take ibuprofen or tylenol at home for pain and fever reduction. The patient was discharged home to follow up with PCP in 1 week. Return precautions given.    Final Clinical Impressions(s) / ED Diagnoses   Final diagnoses:  Viral syndrome  Cough    New Prescriptions Discharge Medication List as of 11/15/2016 10:40 PM     This note was generated with the assistant of a scribe. I have reviewed the documentation.    Princella Pellegrini 11/16/16 Reola Mosher    Eber Hong, MD 11/16/16 [phone]

## 2016-11-15 NOTE — ED Triage Notes (Signed)
Pt c/o generalized body aches that started yesterday

## 2016-11-18 LAB — CULTURE, GROUP A STREP (THRC)

## 2017-01-24 ENCOUNTER — Emergency Department (HOSPITAL_COMMUNITY)
Admission: EM | Admit: 2017-01-24 | Discharge: 2017-01-25 | Disposition: A | Discharge status: Home / Self Care | Payer: Medicaid Other | Attending: Emergency Medicine | Admitting: Emergency Medicine

## 2017-01-24 ENCOUNTER — Encounter (HOSPITAL_COMMUNITY): Payer: Self-pay | Admitting: Emergency Medicine

## 2017-01-24 DIAGNOSIS — Z87891 Personal history of nicotine dependence: Secondary | ICD-10-CM | POA: Insufficient documentation

## 2017-01-24 DIAGNOSIS — N390 Urinary tract infection, site not specified: Secondary | ICD-10-CM | POA: Insufficient documentation

## 2017-01-24 LAB — POC URINE PREG, ED: Preg Test, Ur: NEGATIVE

## 2017-01-24 NOTE — ED Triage Notes (Signed)
Pt c/o right flank pain with dysuria that started yesterday.

## 2017-01-24 NOTE — ED Provider Notes (Signed)
[facility] DE[facility] Provider Note   CSN: [id number] Arrival date & time: 01/24/17  2251     History   Chief Complaint Chief Complaint  Patient presents with  . Flank Pain    HPI Alexis Berg is a 26 y.o. female.  Patient presents to the ER for evaluation of right flank pain. Patient reports symptoms began yesterday. She has had a persistent and constant throbbing pain in the right flank area that has not changed. She has not identified any alleviating factors, although she has noticed some increased pain when she urinates. This has been accompanied with bladder area pain as well. This feels similar to when she has had urinary tract infection in the past. She has not had any fever. There has not been any nausea or vomiting. Pain does not worsen with movements.      Past Medical History:  Diagnosis Date  . MRSA (methicillin resistant staph aureus) culture positive     There are no active problems to display for this patient.   History reviewed. No pertinent surgical history.  OB History    No data available       Home Medications    Prior to Admission medications   Medication Sig Start Date End Date Taking? Authorizing Provider  cephALEXin (KEFLEX) 500 MG capsule Take 1 capsule (500 mg total) by mouth 2 (two) times daily. 01/25/17   Gilda CreasePollina, Alya Smaltz J, MD  ibuprofen (ADVIL,MOTRIN) 600 MG tablet Take 1 tablet (600 mg total) by mouth 4 (four) times daily. Patient not taking: Reported on 11/06/2016 08/23/16   Ivery QualeBryant, Hobson, PA-C  phenazopyridine (PYRIDIUM) 200 MG tablet Take 1 tablet (200 mg total) by mouth 3 (three) times daily as needed for pain. 01/25/17   Gilda CreasePollina, Madilynn Montante J, MD  promethazine (PHENERGAN) 25 MG tablet Take 1 tablet (25 mg total) by mouth every 6 (six) hours as needed for nausea or vomiting. 01/25/17   Francess Mullen, Canary Brimhristopher J, MD  traMADol (ULTRAM) 50 MG tablet Take 1 tablet (50 mg total) by mouth every 6 (six) hours as needed. Patient not  taking: Reported on 11/06/2016 08/23/16   Ivery QualeBryant, Hobson, PA-C    Family History No family history on file.  Social History Social History  Substance Use Topics  . Smoking status: Former Games developermoker  . Smokeless tobacco: Never Used  . Alcohol use No     Allergies   Patient has no known allergies.   Review of Systems Review of Systems  Genitourinary: Positive for dysuria and flank pain.  All other systems reviewed and are negative.    Physical Exam Updated Vital Signs BP 119/81 (BP Location: Right Arm)   Pulse 96   Temp 98.5 F (36.9 C) (Oral)   Resp 17   LMP 01/01/2017   SpO2 99%   Physical Exam  Constitutional: She is oriented to person, place, and time. She appears well-developed and well-nourished. No distress.  HENT:  Head: Normocephalic and atraumatic.  Right Ear: Hearing normal.  Left Ear: Hearing normal.  Nose: Nose normal.  Mouth/Throat: Oropharynx is clear and moist and mucous membranes are normal.  Eyes: Pupils are equal, round, and reactive to light. Conjunctivae and EOM are normal.  Neck: Normal range of motion. Neck supple.  Cardiovascular: Regular rhythm, S1 normal and S2 normal.  Exam reveals no gallop and no friction rub.   No murmur heard. Pulmonary/Chest: Effort normal and breath sounds normal. No respiratory distress. She exhibits no tenderness.  Abdominal: Soft. Normal appearance and bowel sounds are normal.  There is no hepatosplenomegaly. There is no tenderness. There is no rebound, no guarding, no tenderness at McBurney's point and negative Murphy's sign. No hernia.  Musculoskeletal: Normal range of motion.  Neurological: She is alert and oriented to person, place, and time. She has normal strength. No cranial nerve deficit or sensory deficit. Coordination normal. GCS eye subscore is 4. GCS verbal subscore is 5. GCS motor subscore is 6.  Skin: Skin is warm, dry and intact. No rash noted. No cyanosis.  Psychiatric: She has a normal mood and affect.  Her speech is normal and behavior is normal. Thought content normal.  Nursing note and vitals reviewed.    ED Treatments / Results  Labs (all labs ordered are listed, but only abnormal results are displayed) Labs Reviewed  URINALYSIS, ROUTINE W REFLEX MICROSCOPIC - Abnormal; Notable for the following:       Result Value   APPearance HAZY (*)    Hgb urine dipstick MODERATE (*)    Protein, ur 30 (*)    Leukocytes, UA MODERATE (*)    Bacteria, UA RARE (*)    Squamous Epithelial / LPF 0-5 (*)    All other components within normal limits  POC URINE PREG, ED    EKG  EKG Interpretation None       Radiology No results found.  Procedures Procedures (including critical care time)  Medications Ordered in ED Medications  cephALEXin (KEFLEX) capsule 1,000 mg (not administered)  phenazopyridine (PYRIDIUM) tablet 200 mg (not administered)     Initial Impression / Assessment and Plan / ED Course  I have reviewed the triage vital signs and the nursing notes.  Pertinent labs & imaging results that were available during my care of the patient were reviewed by me and considered in my medical decision making (see chart for details).     Patient presents to the emergency department with complaints of right flank pain and dysuria. She has had urinary tract infection years ago with similar symptoms. She appears well, no fever. She is not having nausea or vomiting. Examination of the abdomen was benign, nontender. Urinalysis does show obvious signs of infection. She'll be initiated on antibiotic treatment with analgesia, return for worsening symptoms.  Final Clinical Impressions(s) / ED Diagnoses   Final diagnoses:  Urinary tract infection without hematuria, site unspecified    New Prescriptions New Prescriptions   CEPHALEXIN (KEFLEX) 500 MG CAPSULE    Take 1 capsule (500 mg total) by mouth 2 (two) times daily.   PHENAZOPYRIDINE (PYRIDIUM) 200 MG TABLET    Take 1 tablet (200 mg  total) by mouth 3 (three) times daily as needed for pain.   PROMETHAZINE (PHENERGAN) 25 MG TABLET    Take 1 tablet (25 mg total) by mouth every 6 (six) hours as needed for nausea or vomiting.     Gilda CreasePollina, Dilyn Smiles J, MD 01/25/17 [phone]

## 2017-01-25 LAB — URINALYSIS, ROUTINE W REFLEX MICROSCOPIC
BILIRUBIN URINE: NEGATIVE
Glucose, UA: NEGATIVE mg/dL
KETONES UR: NEGATIVE mg/dL
Nitrite: NEGATIVE
PH: 7 (ref 5.0–8.0)
Protein, ur: 30 mg/dL — AB
SPECIFIC GRAVITY, URINE: 1.016 (ref 1.005–1.030)

## 2017-01-25 MED ORDER — PHENAZOPYRIDINE HCL 200 MG PO TABS: 200.0000 mg | ORAL_TABLET | Freq: Three times a day (TID) | ORAL | 0 refills | Status: DC | PRN

## 2017-01-25 MED ORDER — PROMETHAZINE HCL 25 MG PO TABS: 25.0000 mg | ORAL_TABLET | Freq: Four times a day (QID) | ORAL | 0 refills | Status: DC | PRN

## 2017-01-25 MED ORDER — CEPHALEXIN 500 MG PO CAPS: 500.0000 mg | ORAL_CAPSULE | Freq: Two times a day (BID) | ORAL | 0 refills | Status: DC

## 2017-01-25 MED ORDER — CEPHALEXIN 500 MG PO CAPS
1000.0000 mg | ORAL_CAPSULE | Freq: Once | ORAL | Status: AC
  Administered 2017-01-25: 1000 mg via ORAL

## 2017-01-25 MED ORDER — PHENAZOPYRIDINE HCL 100 MG PO TABS
ORAL_TABLET | ORAL | Status: AC
  Filled 2017-01-25: qty 2

## 2017-01-25 MED ORDER — PHENAZOPYRIDINE HCL 100 MG PO TABS
200.0000 mg | ORAL_TABLET | Freq: Once | ORAL | Status: AC
  Administered 2017-01-25: 200 mg via ORAL

## 2017-01-25 MED ORDER — CEPHALEXIN 500 MG PO CAPS
ORAL_CAPSULE | ORAL | Status: AC
  Administered 2017-01-25: 1000 mg via ORAL
  Filled 2017-01-25: qty 2

## 2017-01-25 NOTE — ED Notes (Signed)
Verbal and written discharge instructions given, pt verbalized understanding, RX given, pt ambulated off unit at this time with a steady gait in stable condition

## 2017-10-07 ENCOUNTER — Emergency Department (HOSPITAL_COMMUNITY)
Admission: EM | Admit: 2017-10-07 | Discharge: 2017-10-07 | Disposition: A | Discharge status: Home / Self Care | Payer: BLUE CROSS/BLUE SHIELD | Attending: Emergency Medicine | Admitting: Emergency Medicine

## 2017-10-07 ENCOUNTER — Encounter (HOSPITAL_COMMUNITY): Payer: Self-pay | Admitting: Student

## 2017-10-07 ENCOUNTER — Other Ambulatory Visit: Payer: Self-pay

## 2017-10-07 DIAGNOSIS — K0889 Other specified disorders of teeth and supporting structures: Secondary | ICD-10-CM | POA: Diagnosis not present

## 2017-10-07 DIAGNOSIS — Z79899 Other long term (current) drug therapy: Secondary | ICD-10-CM | POA: Diagnosis not present

## 2017-10-07 DIAGNOSIS — Z87891 Personal history of nicotine dependence: Secondary | ICD-10-CM | POA: Insufficient documentation

## 2017-10-07 MED ORDER — DICLOFENAC SODIUM 75 MG PO TBEC: 75.0000 mg | DELAYED_RELEASE_TABLET | Freq: Two times a day (BID) | ORAL | 0 refills | Status: DC

## 2017-10-07 MED ORDER — CLINDAMYCIN HCL 150 MG PO CAPS: 300.0000 mg | ORAL_CAPSULE | Freq: Four times a day (QID) | ORAL | 0 refills | Status: DC

## 2017-10-07 MED ORDER — HYDROCODONE-ACETAMINOPHEN 5-325 MG PO TABS
1.0000 | ORAL_TABLET | Freq: Once | ORAL | Status: AC
  Administered 2017-10-07: 1 via ORAL
  Filled 2017-10-07: qty 1

## 2017-10-07 MED ORDER — CLINDAMYCIN HCL 150 MG PO CAPS
300.0000 mg | ORAL_CAPSULE | Freq: Once | ORAL | Status: AC
  Administered 2017-10-07: 300 mg via ORAL
  Filled 2017-10-07: qty 2

## 2017-10-07 NOTE — Discharge Instructions (Addendum)
Follow-up with your dentist soon.  Return to the ER for any worsening symptoms such as facial swelling difficulty swallowing or breathing, fever, and difficulty opening your mouth

## 2017-10-07 NOTE — ED Triage Notes (Signed)
Pt c/o of L bottom, tooth ache that started on 10/03/17. Tylenol and ibuprofen did not help

## 2017-10-07 NOTE — ED Provider Notes (Signed)
[facility] EMERGENCY DEPARTMENT Provider Note   CSN: [id number] Arrival date & time: 10/07/17  2145     History   Chief Complaint Chief Complaint  Patient presents with  . Dental Pain    HPI Alexis Berg is a 27 y.o. female.  HPI   Alexis Berg is a 27 y.o. female who presents to the Emergency Department complaining of left lower dental pain that began 2 days ago.  She states that 2 of her lower molars "broke off" 1 month ago.  She has not had pain to her teeth until recently.  She complains of sharp constant pain to her lower left molars and jaw.  Pain is associated with chewing and sensation of cold foods or fluids.  She has tried Tylenol and ibuprofen without relief.  She also has tried home therapies of warm salt water and vanilla extract applied to the tooth also without relief.  She denies fever, neck pain, facial swelling, difficulty swallowing or breathing and difficulty opening or closing her mouth.  She does state that she has a dentist appointment in 2 weeks.  Past Medical History:  Diagnosis Date  . MRSA (methicillin resistant staph aureus) culture positive     There are no active problems to display for this patient.   History reviewed. No pertinent surgical history.   OB History   None      Home Medications    Prior to Admission medications   Medication Sig Start Date End Date Taking? Authorizing Provider  cephALEXin (KEFLEX) 500 MG capsule Take 1 capsule (500 mg total) by mouth 2 (two) times daily. 01/25/17   Gilda Crease, MD  ibuprofen (ADVIL,MOTRIN) 600 MG tablet Take 1 tablet (600 mg total) by mouth 4 (four) times daily. Patient not taking: Reported on 11/06/2016 08/23/16   Ivery Quale, PA-C  phenazopyridine (PYRIDIUM) 200 MG tablet Take 1 tablet (200 mg total) by mouth 3 (three) times daily as needed for pain. 01/25/17   Gilda Crease, MD  promethazine (PHENERGAN) 25 MG tablet Take 1 tablet (25 mg total) by mouth every 6  (six) hours as needed for nausea or vomiting. 01/25/17   Pollina, Canary Brim, MD  traMADol (ULTRAM) 50 MG tablet Take 1 tablet (50 mg total) by mouth every 6 (six) hours as needed. Patient not taking: Reported on 11/06/2016 08/23/16   Ivery Quale, PA-C    Family History No family history on file.  Social History Social History   Tobacco Use  . Smoking status: Former Games developer  . Smokeless tobacco: Never Used  Substance Use Topics  . Alcohol use: No  . Drug use: No     Allergies   Patient has no known allergies.   Review of Systems Review of Systems  Constitutional: Negative for appetite change and fever.  HENT: Positive for dental problem. Negative for congestion, facial swelling, sore throat and trouble swallowing.   Eyes: Negative for pain and visual disturbance.  Respiratory: Negative for shortness of breath.   Cardiovascular: Negative for chest pain.  Gastrointestinal: Negative for nausea and vomiting.  Musculoskeletal: Negative for neck pain and neck stiffness.  Neurological: Negative for dizziness, facial asymmetry and headaches.  Hematological: Negative for adenopathy.  All other systems reviewed and are negative.    Physical Exam Updated Vital Signs BP 118/72 (BP Location: Right Arm)   Pulse (!) 109   Temp 98 F (36.7 C) (Oral)   Resp 16   Ht 5\' 1"  (1.549 m)   Wt 107.5 kg (  237 lb)   LMP 09/18/2017   SpO2 97%   BMI 44.78 kg/m   Physical Exam  Constitutional: She is oriented to person, place, and time. She appears well-developed and well-nourished. No distress.  HENT:  Head: Normocephalic and atraumatic.  Right Ear: Tympanic membrane and ear canal normal.  Left Ear: Tympanic membrane and ear canal normal.  Mouth/Throat: Uvula is midline, oropharynx is clear and moist and mucous membranes are normal. No trismus in the jaw. Dental caries present. No dental abscesses or uvula swelling.  Tenderness to palpation and dental caries of the left lower second  and third molars.  No facial swelling, obvious dental abscess, trismus, or sublingual abnml.    Neck: Normal range of motion. Neck supple.  Cardiovascular: Normal rate and regular rhythm.  No murmur heard. Pulmonary/Chest: Effort normal and breath sounds normal.  Musculoskeletal: Normal range of motion.  Lymphadenopathy:    She has no cervical adenopathy.  Neurological: She is alert and oriented to person, place, and time. She exhibits normal muscle tone. Coordination normal.  Skin: Skin is warm and dry. Capillary refill takes less than 2 seconds.  Nursing note and vitals reviewed.    ED Treatments / Results  Labs (all labs ordered are listed, but only abnormal results are displayed) Labs Reviewed - No data to display  EKG None  Radiology No results found.  Procedures Procedures (including critical care time)  Medications Ordered in ED Medications - No data to display   Initial Impression / Assessment and Plan / ED Course  I have reviewed the triage vital signs and the nursing notes.  Pertinent labs & imaging results that were available during my care of the patient were reviewed by me and considered in my medical decision making (see chart for details).     Patient well-appearing.  Airway patent.  No facial edema or obvious signs of dental abscess.  No concerning symptoms for Ludwig's angina.  Patient has appointment with her dentist in 2 weeks.  She agrees to treatment plan with NSAID and clindamycin.  Final Clinical Impressions(s) / ED Diagnoses   Final diagnoses:  Pain, dental    ED Discharge Orders    None       Rosey Bathriplett, Domino Holten, PA-C 10/07/17 2227    Terrilee FilesButler, Michael C, MD 10/08/17 1231

## 2017-11-17 ENCOUNTER — Other Ambulatory Visit: Payer: Self-pay

## 2017-11-17 ENCOUNTER — Emergency Department (HOSPITAL_COMMUNITY): Payer: BLUE CROSS/BLUE SHIELD

## 2017-11-17 ENCOUNTER — Emergency Department (HOSPITAL_COMMUNITY)
Admission: EM | Admit: 2017-11-17 | Discharge: 2017-11-17 | Disposition: A | Discharge status: Home / Self Care | Payer: BLUE CROSS/BLUE SHIELD | Attending: Emergency Medicine | Admitting: Emergency Medicine

## 2017-11-17 ENCOUNTER — Encounter (HOSPITAL_COMMUNITY): Payer: Self-pay

## 2017-11-17 DIAGNOSIS — O26899 Other specified pregnancy related conditions, unspecified trimester: Secondary | ICD-10-CM | POA: Insufficient documentation

## 2017-11-17 DIAGNOSIS — Z87891 Personal history of nicotine dependence: Secondary | ICD-10-CM | POA: Diagnosis not present

## 2017-11-17 DIAGNOSIS — Z3A01 Less than 8 weeks gestation of pregnancy: Secondary | ICD-10-CM | POA: Diagnosis not present

## 2017-11-17 DIAGNOSIS — Z3201 Encounter for pregnancy test, result positive: Secondary | ICD-10-CM | POA: Insufficient documentation

## 2017-11-17 DIAGNOSIS — Z3490 Encounter for supervision of normal pregnancy, unspecified, unspecified trimester: Secondary | ICD-10-CM

## 2017-11-17 DIAGNOSIS — R109 Unspecified abdominal pain: Secondary | ICD-10-CM

## 2017-11-17 DIAGNOSIS — R1030 Lower abdominal pain, unspecified: Secondary | ICD-10-CM | POA: Diagnosis not present

## 2017-11-17 LAB — CBC WITH DIFFERENTIAL/PLATELET
BASOS ABS: 0 10*3/uL (ref 0.0–0.1)
Basophils Relative: 0 %
Eosinophils Absolute: 0.1 10*3/uL (ref 0.0–0.7)
Eosinophils Relative: 1 %
HEMATOCRIT: 37.7 % (ref 36.0–46.0)
Hemoglobin: 12.3 g/dL (ref 12.0–15.0)
LYMPHS PCT: 39 %
Lymphs Abs: 3 10*3/uL (ref 0.7–4.0)
MCH: 31.2 pg (ref 26.0–34.0)
MCHC: 32.6 g/dL (ref 30.0–36.0)
MCV: 95.7 fL (ref 78.0–100.0)
Monocytes Absolute: 0.8 10*3/uL (ref 0.1–1.0)
Monocytes Relative: 10 %
NEUTROS ABS: 3.9 10*3/uL (ref 1.7–7.7)
Neutrophils Relative %: 50 %
PLATELETS: 309 10*3/uL (ref 150–400)
RBC: 3.94 MIL/uL (ref 3.87–5.11)
RDW: 13 % (ref 11.5–15.5)
WBC: 7.8 10*3/uL (ref 4.0–10.5)

## 2017-11-17 LAB — COMPREHENSIVE METABOLIC PANEL
ALT: 24 U/L (ref 14–54)
AST: 24 U/L (ref 15–41)
Albumin: 3.8 g/dL (ref 3.5–5.0)
Alkaline Phosphatase: 52 U/L (ref 38–126)
Anion gap: 8 (ref 5–15)
BUN: 11 mg/dL (ref 6–20)
CO2: 24 mmol/L (ref 22–32)
CREATININE: 0.46 mg/dL (ref 0.44–1.00)
Calcium: 9.1 mg/dL (ref 8.9–10.3)
Chloride: 105 mmol/L (ref 101–111)
GFR calc Af Amer: >60 mL/min (ref >60)
GLUCOSE: 82 mg/dL (ref 65–99)
Potassium: 3.8 mmol/L (ref 3.5–5.1)
Sodium: 137 mmol/L (ref 135–145)
Total Bilirubin: 0.6 mg/dL (ref 0.3–1.2)
Total Protein: 7 g/dL (ref 6.5–8.1)

## 2017-11-17 LAB — LIPASE, BLOOD: LIPASE: 24 U/L (ref 11–51)

## 2017-11-17 LAB — URINALYSIS, ROUTINE W REFLEX MICROSCOPIC
Bilirubin Urine: NEGATIVE
GLUCOSE, UA: NEGATIVE mg/dL
HGB URINE DIPSTICK: NEGATIVE
Ketones, ur: NEGATIVE mg/dL
Leukocytes, UA: NEGATIVE
Nitrite: NEGATIVE
PH: 6 (ref 5.0–8.0)
PROTEIN: NEGATIVE mg/dL
SPECIFIC GRAVITY, URINE: 1.02 (ref 1.005–1.030)

## 2017-11-17 LAB — PREGNANCY, URINE: Preg Test, Ur: POSITIVE — AB

## 2017-11-17 LAB — HCG, QUANTITATIVE, PREGNANCY: hCG, Beta Chain, Quant, S: 3282 m[IU]/mL — ABNORMAL HIGH (ref <5)

## 2017-11-17 MED ORDER — MORPHINE SULFATE (PF) 4 MG/ML IV SOLN
4.0000 mg | Freq: Once | INTRAVENOUS | Status: AC
  Administered 2017-11-17: 4 mg via INTRAVENOUS
  Filled 2017-11-17: qty 1

## 2017-11-17 MED ORDER — SODIUM CHLORIDE 0.9 % IV BOLUS
1000.0000 mL | Freq: Once | INTRAVENOUS | Status: AC
Start: 2017-11-17 — End: 2017-11-17
  Administered 2017-11-17: 1000 mL via INTRAVENOUS

## 2017-11-17 MED ORDER — ONDANSETRON HCL 4 MG/2ML IJ SOLN
4.0000 mg | Freq: Once | INTRAMUSCULAR | Status: AC
  Administered 2017-11-17: 4 mg via INTRAVENOUS
  Filled 2017-11-17: qty 2

## 2017-11-17 NOTE — ED Provider Notes (Signed)
[facility] EMERGENCY DEPARTMENT Provider Note   CSN: [id number] Arrival date & time: 11/17/17  0749     History   Chief Complaint Chief Complaint  Patient presents with  . Abdominal Pain    HPI Alexis Berg is a 27 y.o. female.  Lower abdominal pain for 3 days.  Last menstrual period in mid April.  No hematuria, dysuria, vaginal bleeding, vaginal discharge, fever, diarrhea, vomiting.  She is sexually active without birth control.  Severity symptoms mild to moderate.  Nothing makes symptoms better or worse.     Past Medical History:  Diagnosis Date  . MRSA (methicillin resistant staph aureus) culture positive     There are no active problems to display for this patient.   History reviewed. No pertinent surgical history.   OB History   None      Home Medications    Prior to Admission medications   Not on File    Family History No family history on file.  Social History Social History   Tobacco Use  . Smoking status: Former Games developer  . Smokeless tobacco: Never Used  Substance Use Topics  . Alcohol use: No  . Drug use: No     Allergies   Patient has no known allergies.   Review of Systems Review of Systems  All other systems reviewed and are negative.    Physical Exam Updated Vital Signs BP 124/79 (BP Location: Left Arm)   Pulse 92   Temp 98.4 F (36.9 C) (Oral)   Resp 18   Ht  (1.549 m)   Wt 104.3 kg (230 lb)   SpO2 100%   BMI 43.46 kg/m   Physical Exam  Constitutional: She is oriented to person, place, and time. She appears well-developed and well-nourished.  HENT:  Head: Normocephalic and atraumatic.  Eyes: Conjunctivae are normal.  Neck: Neck supple.  Cardiovascular: Normal rate and regular rhythm.  Pulmonary/Chest: Effort normal and breath sounds normal.  Abdominal: Soft. Bowel sounds are normal.  No lower abdominal tenderness.  Musculoskeletal: Normal range of motion.  Neurological: She is alert and oriented  to person, place, and time.  Skin: Skin is warm and dry.  Psychiatric: She has a normal mood and affect. Her behavior is normal.  Nursing note and vitals reviewed.    ED Treatments / Results  Labs (all labs ordered are listed, but only abnormal results are displayed) Labs Reviewed  PREGNANCY, URINE - Abnormal; Notable for the following components:      Result Value   Preg Test, Ur POSITIVE (*)    All other components within normal limits  HCG, QUANTITATIVE, PREGNANCY - Abnormal; Notable for the following components:   hCG, Beta Chain, Quant, S 3,282 (*)    All other components within normal limits  CBC WITH DIFFERENTIAL/PLATELET  COMPREHENSIVE METABOLIC PANEL  LIPASE, BLOOD  URINALYSIS, ROUTINE W REFLEX MICROSCOPIC  GC/CHLAMYDIA PROBE AMP ([place]) NOT AT [facility]    EKG None  Radiology US Ob Less Than 14 Weeks With Ob Transvaginal  Result Date: 11/17/2017 CLINICAL DATA:  Abdominal pain EXAM: OBSTETRIC <14 WK ULTRASOUND TECHNIQUE: Transabdominal ultrasound was performed for evaluation of the gestation as well as the maternal uterus and adnexal regions. COMPARISON:  None. FINDINGS: Intrauterine gestational sac: Single Yolk sac:  Not Visualized. Embryo:  Not Visualized. Cardiac Activity: Not Visualized. MSD: 5.6 mm   5 w   2 d Subchorionic hemorrhage:  None visualized. Maternal uterus/adnexae: 2.6 cm simple appearing left ovarian cyst/follicle. Corpus luteum  on the right. Trace simple pelvic fluid, nonspecific. IMPRESSION: Intrauterine sac measuring 5 weeks 2 days. A fetus and yolk sac are not yet identified. Consider follow-up ultrasound in 10 days and serial quantitative beta HCG follow-up. No acute finding. Electronically Signed   By: Marnee Spring M.D.   On: 11/17/2017 12:36    Procedures Procedures (including critical care time)  Medications Ordered in ED Medications  sodium chloride 0.9 % bolus 1,000 mL (0 mLs Intravenous Stopped 11/17/17 1126)  morphine 4 MG/ML injection  4 mg (4 mg Intravenous Given 11/17/17 0947)  ondansetron (ZOFRAN) injection 4 mg (4 mg Intravenous Given 11/17/17 0947)     Initial Impression / Assessment and Plan / ED Course  I have reviewed the triage vital signs and the nursing notes.  Pertinent labs & imaging results that were available during my care of the patient were reviewed by me and considered in my medical decision making (see chart for details).     Patient presents with lower abdominal pain and a late menstrual period.  Quantitative hCG was 3282.  OB ultrasound revealed a 5-week 2-day intrauterine sac.  However fetus and yolk sac were not identified.  These findings were discussed with the patient.  She will follow-up with OB this week.  Final Clinical Impressions(s) / ED Diagnoses   Final diagnoses:  Pregnancy, unspecified gestational age    ED Discharge Orders    None       Donnetta Hutching, MD 11/17/17 1339

## 2017-11-17 NOTE — ED Triage Notes (Signed)
Pt states she is having lower mid abdominal pain that started 3 days ago. Denies vomiting/diarrhea. Last BM 5/17. States she is 3 days late for period.

## 2017-11-17 NOTE — Discharge Instructions (Addendum)
Tests show a positive pregnancy test.  You will need OB follow-up next week.  Call the number provided for an appointment.

## 2017-11-24 ENCOUNTER — Emergency Department (HOSPITAL_COMMUNITY)
Admission: EM | Admit: 2017-11-24 | Discharge: 2017-11-24 | Disposition: A | Discharge status: Home / Self Care | Payer: BLUE CROSS/BLUE SHIELD | Attending: Emergency Medicine | Admitting: Emergency Medicine

## 2017-11-24 ENCOUNTER — Other Ambulatory Visit: Payer: Self-pay

## 2017-11-24 ENCOUNTER — Encounter (HOSPITAL_COMMUNITY): Payer: Self-pay | Admitting: Emergency Medicine

## 2017-11-24 DIAGNOSIS — O99611 Diseases of the digestive system complicating pregnancy, first trimester: Secondary | ICD-10-CM | POA: Insufficient documentation

## 2017-11-24 DIAGNOSIS — R630 Anorexia: Secondary | ICD-10-CM | POA: Insufficient documentation

## 2017-11-24 DIAGNOSIS — O9989 Other specified diseases and conditions complicating pregnancy, childbirth and the puerperium: Secondary | ICD-10-CM | POA: Diagnosis not present

## 2017-11-24 DIAGNOSIS — K5900 Constipation, unspecified: Secondary | ICD-10-CM | POA: Diagnosis not present

## 2017-11-24 DIAGNOSIS — Z87891 Personal history of nicotine dependence: Secondary | ICD-10-CM | POA: Diagnosis not present

## 2017-11-24 DIAGNOSIS — Z3A12 12 weeks gestation of pregnancy: Secondary | ICD-10-CM | POA: Insufficient documentation

## 2017-11-24 DIAGNOSIS — Z3A00 Weeks of gestation of pregnancy not specified: Secondary | ICD-10-CM | POA: Diagnosis not present

## 2017-11-24 DIAGNOSIS — O99612 Diseases of the digestive system complicating pregnancy, second trimester: Secondary | ICD-10-CM | POA: Diagnosis not present

## 2017-11-24 MED ORDER — BISACODYL 10 MG RE SUPP: 10.0000 mg | RECTAL | 0 refills | Status: DC | PRN

## 2017-11-24 MED ORDER — MILK AND MOLASSES ENEMA
1.0000 | Freq: Once | RECTAL | Status: AC
  Administered 2017-11-24: 250 mL via RECTAL
  Filled 2017-11-24: qty 250

## 2017-11-24 MED ORDER — POLYETHYLENE GLYCOL 3350 17 GM/SCOOP PO POWD: 1.0000 | Freq: Once | ORAL | 0 refills | Status: AC

## 2017-11-24 NOTE — Discharge Instructions (Addendum)
He signed the ER for constipation. We suspect that the constipation is likely worse because of pregnancy. We recommend that you take suppositories and drink plenty of fluid.  Also increase fiber in your diet. See your [facility] doctor for further management of the constipation.

## 2017-11-24 NOTE — ED Notes (Signed)
Patient unable to retain much of the enema. Bedside commode is in room for patient use.

## 2017-11-24 NOTE — ED Triage Notes (Signed)
Pt is pregnant and constipated

## 2017-11-24 NOTE — ED Provider Notes (Addendum)
[facility] EMERGENCY DEPARTMENT Provider Note   CSN: [id number] Arrival date & time: 11/24/17  2107     History   Chief Complaint Chief Complaint  Patient presents with  . Constipation    HPI Alexis Berg is a 27 y.o. female.  HPI  27 year old comes in a chief complaint of constipation.  Patient is G4, P3 and about 3 months pregnant right now.  She states that she has not had a bowel movement in the last 2 days.  She is passing flatus but barely.  Patient has lost her appetite, but she denies any nausea or vomiting.  Patient has no new abdominal pain or back pain, but she feels bloated.  She denies any vaginal discharge or bleeding or UTI-like symptoms.  Patient has had constipation in the past, however she was doing well until she got pregnant.  Past Medical History:  Diagnosis Date  . MRSA (methicillin resistant staph aureus) culture positive     There are no active problems to display for this patient.   History reviewed. No pertinent surgical history.   OB History   None      Home Medications    Prior to Admission medications   Medication Sig Start Date End Date Taking? Authorizing Provider  acetaminophen (TYLENOL) 500 MG tablet Take 500 mg by mouth every 6 (six) hours as needed for mild pain or moderate pain.   Yes [provider]  Glycerin, Laxative, (ADULT SUPPOSITORY RE) Place rectally once as needed (for constipation).   Yes [provider]  mineral oil enema Place 1 enema rectally once.   Yes [provider]  bisacodyl (DULCOLAX) 10 MG suppository Place 1 suppository (10 mg total) rectally as needed for moderate constipation. 11/24/17   Derwood Kaplan, MD  polyethylene glycol powder (GLYCOLAX/MIRALAX) powder Take 255 g by mouth once for 1 dose. 11/24/17 11/24/17  Derwood Kaplan, MD    Family History No family history on file.  Social History Social History   Tobacco Use  . Smoking status: Former Games developer  .  Smokeless tobacco: Never Used  Substance Use Topics  . Alcohol use: No  . Drug use: No     Allergies   Patient has no known allergies.   Review of Systems Review of Systems  Constitutional: Positive for activity change.  Respiratory: Negative for shortness of breath.   Cardiovascular: Negative for chest pain.  Gastrointestinal: Positive for constipation. Negative for nausea and vomiting.  Genitourinary: Negative for dysuria, flank pain and pelvic pain.     Physical Exam Updated Vital Signs BP 108/78 (BP Location: Left Arm)   Pulse 91   Temp 98.4 F (36.9 C) (Oral)   Resp 16   Ht  (1.549 m)   Wt 104.3 kg (230 lb)   LMP 09/24/2017   SpO2 100%   BMI 43.46 kg/m   Physical Exam  Constitutional: She is oriented to person, place, and time. She appears well-developed.  HENT:  Head: Normocephalic and atraumatic.  Eyes: EOM are normal.  Neck: Normal range of motion. Neck supple.  Cardiovascular: Normal rate.  Pulmonary/Chest: Effort normal.  Abdominal: Soft. Bowel sounds are normal. She exhibits distension. There is no tenderness. There is no guarding.  Hypotonic bowel sounds  Neurological: She is alert and oriented to person, place, and time.  Skin: Skin is warm and dry.  Nursing note and vitals reviewed.    ED Treatments / Results  Labs (all labs ordered are listed, but only abnormal results are  displayed) Labs Reviewed - No data to display  EKG None  Radiology No results found.  Procedures Procedures (including critical care time)  Medications Ordered in ED Medications  milk and molasses enema (250 mLs Rectal Given 11/24/17 2234)     Initial Impression / Assessment and Plan / ED Course  I have reviewed the triage vital signs and the nursing notes.  Pertinent labs & imaging results that were available during my care of the patient were reviewed by me and considered in my medical decision making (see chart for details).     27 year old female  comes in a chief complaint of constipation. Patient has history of constipation, which appears to have gotten worse because of her pregnancy.  She has a soft abdomen without any tenderness.  She is passing flatus.  No indication for any imaging at this time.  Enema ordered and patient did have a bowel movement in the ED.  We will start conservative treatment with suppositories and oral stool softeners.  Patient advised to see her OB doctor as soon as possible. No clinical concerns from OB perspective at this time given that there is no abd pain, vaginal bleeding, uti like symptoms or back pain.  Final Clinical Impressions(s) / ED Diagnoses   Final diagnoses:  Constipation, unspecified constipation type    ED Discharge Orders        Ordered    polyethylene glycol powder (GLYCOLAX/MIRALAX) powder   Once     11/24/17 2336    bisacodyl (DULCOLAX) 10 MG suppository  As needed     11/24/17 2336       Derwood Kaplan, MD 11/24/17 2340    Derwood Kaplan, MD 11/24/17 2342

## 2017-11-24 NOTE — ED Notes (Signed)
Patient had results from the enema. Patient passed several hard stools. Patient states relief of abdominal pain and pressure.

## 2018-01-03 ENCOUNTER — Encounter (HOSPITAL_COMMUNITY): Payer: Self-pay | Admitting: *Deleted

## 2018-01-03 ENCOUNTER — Inpatient Hospital Stay (HOSPITAL_COMMUNITY)
Admission: AD | Admit: 2018-01-03 | Discharge: 2018-01-03 | Disposition: A | Discharge status: Home / Self Care | Payer: Self-pay | Source: Ambulatory Visit | Attending: Obstetrics and Gynecology | Admitting: Obstetrics and Gynecology

## 2018-01-03 DIAGNOSIS — O035 Genital tract and pelvic infection following complete or unspecified spontaneous abortion: Secondary | ICD-10-CM

## 2018-01-03 DIAGNOSIS — B9689 Other specified bacterial agents as the cause of diseases classified elsewhere: Secondary | ICD-10-CM

## 2018-01-03 DIAGNOSIS — N911 Secondary amenorrhea: Secondary | ICD-10-CM | POA: Insufficient documentation

## 2018-01-03 DIAGNOSIS — Z09 Encounter for follow-up examination after completed treatment for conditions other than malignant neoplasm: Secondary | ICD-10-CM | POA: Insufficient documentation

## 2018-01-03 DIAGNOSIS — Z8759 Personal history of other complications of pregnancy, childbirth and the puerperium: Secondary | ICD-10-CM | POA: Insufficient documentation

## 2018-01-03 DIAGNOSIS — N760 Acute vaginitis: Secondary | ICD-10-CM | POA: Insufficient documentation

## 2018-01-03 DIAGNOSIS — Z87891 Personal history of nicotine dependence: Secondary | ICD-10-CM | POA: Insufficient documentation

## 2018-01-03 DIAGNOSIS — N719 Inflammatory disease of uterus, unspecified: Secondary | ICD-10-CM | POA: Insufficient documentation

## 2018-01-03 DIAGNOSIS — Z79899 Other long term (current) drug therapy: Secondary | ICD-10-CM | POA: Insufficient documentation

## 2018-01-03 LAB — POCT PREGNANCY, URINE: PREG TEST UR: NEGATIVE

## 2018-01-03 LAB — CBC WITH DIFFERENTIAL/PLATELET
BASOS ABS: 0.1 10*3/uL (ref 0.0–0.1)
Basophils Relative: 1 %
Eosinophils Absolute: 0.1 10*3/uL (ref 0.0–0.7)
Eosinophils Relative: 2 %
HCT: 37.7 % (ref 36.0–46.0)
HEMOGLOBIN: 12.5 g/dL (ref 12.0–15.0)
LYMPHS ABS: 2.7 10*3/uL (ref 0.7–4.0)
LYMPHS PCT: 42 %
MCH: 32 pg (ref 26.0–34.0)
MCHC: 33.2 g/dL (ref 30.0–36.0)
MCV: 96.4 fL (ref 78.0–100.0)
Monocytes Absolute: 0.4 10*3/uL (ref 0.1–1.0)
Monocytes Relative: 6 %
NEUTROS ABS: 3.2 10*3/uL (ref 1.7–7.7)
NEUTROS PCT: 49 %
PLATELETS: 295 10*3/uL (ref 150–400)
RBC: 3.91 MIL/uL (ref 3.87–5.11)
RDW: 13.1 % (ref 11.5–15.5)
WBC: 6.6 10*3/uL (ref 4.0–10.5)

## 2018-01-03 LAB — URINALYSIS, ROUTINE W REFLEX MICROSCOPIC
BILIRUBIN URINE: NEGATIVE
Glucose, UA: NEGATIVE mg/dL
HGB URINE DIPSTICK: NEGATIVE
KETONES UR: 5 mg/dL — AB
Leukocytes, UA: NEGATIVE
Nitrite: NEGATIVE
PROTEIN: NEGATIVE mg/dL
Specific Gravity, Urine: 1.027 (ref 1.005–1.030)
pH: 5 (ref 5.0–8.0)

## 2018-01-03 LAB — WET PREP, GENITAL
Sperm: NONE SEEN
Trich, Wet Prep: NONE SEEN
Yeast Wet Prep HPF POC: NONE SEEN

## 2018-01-03 MED ORDER — METRONIDAZOLE 500 MG PO TABS: 500.0000 mg | ORAL_TABLET | Freq: Two times a day (BID) | ORAL | 0 refills | Status: DC

## 2018-01-03 MED ORDER — DOXYCYCLINE HYCLATE 100 MG PO CAPS: 100.0000 mg | ORAL_CAPSULE | Freq: Two times a day (BID) | ORAL | 0 refills | Status: DC

## 2018-01-03 MED ORDER — METRONIDAZOLE 500 MG PO TABS
500.0000 mg | ORAL_TABLET | Freq: Once | ORAL | Status: AC
  Administered 2018-01-03: 500 mg via ORAL
  Filled 2018-01-03: qty 1

## 2018-01-03 MED ORDER — DOXYCYCLINE HYCLATE 100 MG PO TABS
100.0000 mg | ORAL_TABLET | Freq: Once | ORAL | Status: AC
  Administered 2018-01-03: 100 mg via ORAL
  Filled 2018-01-03: qty 1

## 2018-01-03 MED ORDER — CEFTRIAXONE SODIUM 250 MG IJ SOLR
250.0000 mg | Freq: Once | INTRAMUSCULAR | Status: AC
  Administered 2018-01-03: 250 mg via INTRAMUSCULAR
  Filled 2018-01-03: qty 250

## 2018-01-03 MED ORDER — TRAMADOL HCL 50 MG PO TABS: 50.0000 mg | ORAL_TABLET | Freq: Four times a day (QID) | ORAL | 0 refills | Status: DC | PRN

## 2018-01-03 MED ORDER — KETOROLAC TROMETHAMINE 60 MG/2ML IM SOLN
60.0000 mg | Freq: Once | INTRAMUSCULAR | Status: AC
  Administered 2018-01-03: 60 mg via INTRAMUSCULAR
  Filled 2018-01-03: qty 2

## 2018-01-03 NOTE — MAU Note (Signed)
Pt reports lower abd cramping off/on for one month, worsening. States she had a miscarriage at the end of May, had a d/c and still hasn't had a period.

## 2018-01-03 NOTE — MAU Provider Note (Signed)
Chief Complaint: Abdominal Pain   First Provider Initiated Contact with Patient 01/03/18 0912     SUBJECTIVE HPI: Alexis Berg is a 27 y.o. G4P0013 at ~6 weeks S/p D&C [facility] a [facility] who presents to [facility] Admissions reporting abd pain since procedure and not resuming menses.   Location: suprapubic Quality: cramping Severity: 10/10 on pain scale Duration: 6 weeks Context: Post-D&C Timing: constant Modifying factors: No improvement w/ IBU Associated signs and symptoms: Pos for chills. Hasn't checked temp. Neg for VB, vaginal discharge.   Past Medical History:  Diagnosis Date  . MRSA (methicillin resistant staph aureus) culture positive    OB History  Gravida Para Term Preterm AB Living  4 3     1 3   SAB TAB Ectopic Multiple Live Births  1            # Outcome Date GA Lbr Len/2nd Weight Sex Delivery Anes PTL Lv  4 SAB           3 Para           2 Para           1 Para            Past Surgical History:  Procedure Laterality Date  . DILATION AND CURETTAGE OF UTERUS    . INCISION AND DRAINAGE     Social History   Socioeconomic History  . Marital status: Single    Spouse name: Not on file  . Number of children: Not on file  . Years of education: Not on file  . Highest education level: Not on file  Occupational History  . Not on file  Social Needs  . Financial resource strain: Not on file  . Food insecurity:    Worry: Not on file    Inability: Not on file  . Transportation needs:    Medical: Not on file    Non-medical: Not on file  Tobacco Use  . Smoking status: Former Games developermoker  . Smokeless tobacco: Never Used  Substance and Sexual Activity  . Alcohol use: No  . Drug use: No  . Sexual activity: Yes    Birth control/protection: Injection    Comment: last depo shot 6 months ago  Lifestyle  . Physical activity:    Days per week: Not on file    Minutes per session: Not on file  . Stress: Not on file  Relationships  . Social connections:     Talks on phone: Not on file    Gets together: Not on file    Attends religious service: Not on file    Active member of club or organization: Not on file    Attends meetings of clubs or organizations: Not on file    Relationship status: Not on file  . Intimate partner violence:    Fear of current or ex partner: Not on file    Emotionally abused: Not on file    Physically abused: Not on file    Forced sexual activity: Not on file  Other Topics Concern  . Not on file  Social History Narrative  . Not on file   No family history on file. No current facility-administered medications on file prior to encounter.    Current Outpatient Medications on File Prior to Encounter  Medication Sig Dispense Refill  . acetaminophen (TYLENOL) 500 MG tablet Take 500 mg by mouth every 6 (six) hours as needed for mild pain or moderate pain.    . bisacodyl (DULCOLAX)  10 MG suppository Place 1 suppository (10 mg total) rectally as needed for moderate constipation. 12 suppository 0  . Glycerin, Laxative, (ADULT SUPPOSITORY RE) Place rectally once as needed (for constipation).    . mineral oil enema Place 1 enema rectally once.     No Known Allergies  I have reviewed patient's Past Medical Hx, Surgical Hx, Family Hx, Social Hx, medications and allergies.   Review of Systems  Constitutional: Positive for chills. Negative for fever.  Gastrointestinal: Positive for abdominal pain. Negative for abdominal distention, blood in stool, constipation, diarrhea, nausea and vomiting.  Genitourinary: Positive for dyspareunia (tried to have IC, but could do it due to pain. No penetration.) and pelvic pain. Negative for difficulty urinating, dysuria, flank pain, frequency, hematuria, urgency, vaginal bleeding, vaginal discharge and vaginal pain.  Musculoskeletal: Positive for myalgias. Negative for back pain.  Neurological: Negative for dizziness.    OBJECTIVE Patient Vitals for the past 24 hrs:  BP Temp Temp src  Pulse Resp SpO2 Height Weight  01/03/18 0818 129/70 99 F (37.2 C) Oral 80 18 100 % 5' (1.524 m) 231 lb (104.8 kg)   Constitutional: Well-developed, well-nourished female in mild distress.  Cardiovascular: normal rate Respiratory: normal rate and effort.  GI: Abd soft, moderate suprapubic TTP. Pos BS x 4 MS: Extremities nontender, no edema, normal ROM  Neurologic: Alert and oriented x 4.  GU: Neg CVAT.  SPECULUM EXAM: NEFG, physiologic discharge, no blood noted, cervix clean  BIMANUAL: cervix closed; uterus not obviously enlarged but exam limited by body habitus, no adnexal tenderness or masses.  Severe CMT.  LAB RESULTS Results for orders placed or performed during the hospital encounter of 01/03/18 (from the past 24 hour(s))  Urinalysis, Routine w reflex microscopic     Status: Abnormal   Collection Time: 01/03/18  8:32 AM  Result Value Ref Range   Color, Urine YELLOW YELLOW   APPearance HAZY (A) CLEAR   Specific Gravity, Urine 1.027 1.005 - 1.030   pH 5.0 5.0 - 8.0   Glucose, UA NEGATIVE NEGATIVE mg/dL   Hgb urine dipstick NEGATIVE NEGATIVE   Bilirubin Urine NEGATIVE NEGATIVE   Ketones, ur 5 (A) NEGATIVE mg/dL   Protein, ur NEGATIVE NEGATIVE mg/dL   Nitrite NEGATIVE NEGATIVE   Leukocytes, UA NEGATIVE NEGATIVE  Pregnancy, urine POC     Status: None   Collection Time: 01/03/18  8:36 AM  Result Value Ref Range   Preg Test, Ur NEGATIVE NEGATIVE  CBC with Differential/Platelet     Status: None   Collection Time: 01/03/18  8:56 AM  Result Value Ref Range   WBC 6.6 4.0 - 10.5 K/uL   RBC 3.91 3.87 - 5.11 MIL/uL   Hemoglobin 12.5 12.0 - 15.0 g/dL   HCT 91.4 78.2 - 95.6 %   MCV 96.4 78.0 - 100.0 fL   MCH 32.0 26.0 - 34.0 pg   MCHC 33.2 30.0 - 36.0 g/dL   RDW 21.3 08.6 - 57.8 %   Platelets 295 150 - 400 K/uL   Neutrophils Relative % 49 %   Neutro Abs 3.2 1.7 - 7.7 K/uL   Lymphocytes Relative 42 %   Lymphs Abs 2.7 0.7 - 4.0 K/uL   Monocytes Relative 6 %   Monocytes Absolute  0.4 0.1 - 1.0 K/uL   Eosinophils Relative 2 %   Eosinophils Absolute 0.1 0.0 - 0.7 K/uL   Basophils Relative 1 %   Basophils Absolute 0.1 0.0 - 0.1 K/uL  Wet prep, genital  Status: Abnormal   Collection Time: 01/03/18  9:52 AM  Result Value Ref Range   Yeast Wet Prep HPF POC NONE SEEN NONE SEEN   Trich, Wet Prep NONE SEEN NONE SEEN   Clue Cells Wet Prep HPF POC PRESENT (A) NONE SEEN   WBC, Wet Prep HPF POC FEW (A) NONE SEEN   Sperm NONE SEEN     IMAGING No results found.  MAU COURSE Orders Placed This Encounter  Procedures  . Wet prep, genital  . Urine Culture  . Urinalysis, Routine w reflex microscopic  . CBC with Differential/Platelet  . HIV antibody (routine testing) (NOT for [facility])  . Lab instructions  . Pregnancy, urine POC  . Discharge patient   Meds ordered this encounter  Medications  . ketorolac (TORADOL) injection 60 mg  . cefTRIAXone (ROCEPHIN) injection 250 mg    Order Specific Question:   Antibiotic Indication:    Answer:   Other Indication (list below)    Order Specific Question:   Other Indication:    Answer:   Post-abortion endometritis  . doxycycline (VIBRA-TABS) tablet 100 mg  . metroNIDAZOLE (FLAGYL) tablet 500 mg  . traMADol (ULTRAM) 50 MG tablet    Sig: Take 1-2 tablets (50-100 mg total) by mouth every 6 (six) hours as needed for severe pain.    Dispense:  30 tablet    Refill:  0    Order Specific Question:   Supervising Provider    Answer:   CONSTANT, PEGGY [[id number]]  . doxycycline (VIBRAMYCIN) 100 MG capsule    Sig: Take 1 capsule (100 mg total) by mouth 2 (two) times daily.    Dispense:  14 capsule    Refill:  0    Order Specific Question:   Supervising Provider    Answer:   CONSTANT, PEGGY [[id number]]  . metroNIDAZOLE (FLAGYL) 500 MG tablet    Sig: Take 1 tablet (500 mg total) by mouth 2 (two) times daily.    Dispense:  14 tablet    Refill:  0    Order Specific Question:   Supervising Provider    Answer:   CONSTANT, PEGGY [[id number]]     MDM -Low abdominal pain since D&C approximately 6 weeks ago.  New onset chills and myalgias.  The patient has no fever or leukocytosis her exam is consistent with postabortion endometritis.  Will treat with Rocephin and doxy.  Wet prep positive BV.  We will treat with Flagyl.  Consulted with Dr. Jolayne Panther .  Appropriate to do 1 week treatment.  -Failure of menses to return may be within normal limits, but if she does not get her menstrual period and the next 2 weeks this can be explored as an outpatient.  ASSESSMENT 1. Endometritis following abortive pregnancy   2. BV (bacterial vaginosis)   3. Secondary amenorrhea     PLAN Discharge home in stable condition. Endometritis precautions Or course until symptoms have resolved.  Start birth control before resuming intercourse. List of providers given. Follow-up Information    Gynecologist of your choice Follow up in 2 week(s).   Why:  follow-up endometritis and not getting your period         Allergies as of 01/03/2018   No Known Allergies     Medication List    TAKE these medications   acetaminophen 500 MG tablet Commonly known as:  TYLENOL Take 500 mg by mouth every 6 (six) hours as needed for mild pain or moderate pain.   ADULT SUPPOSITORY RE Place  rectally once as needed (for constipation).   bisacodyl 10 MG suppository Commonly known as:  DULCOLAX Place 1 suppository (10 mg total) rectally as needed for moderate constipation.   doxycycline 100 MG capsule Commonly known as:  VIBRAMYCIN Take 1 capsule (100 mg total) by mouth 2 (two) times daily.   metroNIDAZOLE 500 MG tablet Commonly known as:  FLAGYL Take 1 tablet (500 mg total) by mouth 2 (two) times daily.   mineral oil enema Place 1 enema rectally once.   traMADol 50 MG tablet Commonly known as:  ULTRAM Take 1-2 tablets (50-100 mg total) by mouth every 6 (six) hours as needed for severe pain.        Katrinka Blazing, [place], CNM 01/03/2018  11:47 AM

## 2018-01-03 NOTE — Discharge Instructions (Signed)
[facility] [employer] for [employer] at [facility]       Phone: [phone]  Center for [employer] at [place] Phone: [phone]  Center for [employer] at [place]  Phone: [phone]  Center for [employer] at [employer]  Phone: [phone]  Center for [facility] at [place]  Phone: [phone]  [facility]       Phone: [phone]  [facility]    Phone: [phone]   [facility] [place])    Phone: [phone]  [facility]    Phone: [phone]  [facility]    Phone: [phone]  [facility]    Phone: [phone]  [facility]       Phone: [phone]   [facility]  Phone: [phone]  [facility]    Phone: [phone]  [facility] [place]   Phone: [phone]  [facility]      Phone: [phone]  [facility]    Phone: [phone]    Endometritis Endometritis is irritation, soreness, or inflammation that affects the lining of the uterus (endometrium). Infection is usually the cause of endometritis. It is important to get treatment to prevent complications. Common complications may include more severe infections and not being able to have children(infertility). What are the causes? This condition may be caused by:  Bacterial infections.  STIs (sexually transmitted infections).  A miscarriage or childbirth, especially after a long labor or cesarean delivery.  Certain gynecological procedures. These may include dilation and curettage (D&C), hysteroscopy, or birth control (contraceptive) insertion.  Tuberculosis (TB).  What are the signs or symptoms? Symptoms of this condition include:  Fever.  Lower abdomen (abdominal)  pain.  Pelvis (pelvic) pain.  Abnormal vaginal discharge or bleeding.  Abdominal bloating (distention) or swelling.  General discomfort or generally feeling ill.  Discomfort with bowel movements.  Constipation.  How is this diagnosed? This condition may be diagnosed based on:  A physical exam, including a pelvic exam.  Tests, such as: ? Blood tests. ? Removal of a sample of endometrial tissue for testing (endometrial biopsy). ? Examining a sample of vaginal discharge under a microscope (wet prep). ? Removal of a sample of fluid from the cervix for testing (cervical culture). ? Surgical examination of the pelvis and abdomen.  How is this treated? This condition is treated with:  Antibiotic medicines.  For more severe cases, hospitalization may be needed to give fluids and antibiotics directly into a vein through an IV tube.  Follow these instructions at home:  Take over-the-counter and prescription medicines only as told by your health care provider.  Drink enough fluid to keep your urine clear or pale yellow.  Take your antibiotic medicine as told by your health care provider. Do not stop taking the antibiotic even if you start to feel better.  Do not douche or have sex (including vaginal, oral, and anal sex) until your health care provider approves.  If your endometritis was caused by an STI, do not have sex (including vaginal, oral, and anal sex) until your partner has also been treated for the STI.  Return to your normal activities as told by your health care provider. Ask your health care provider what activities are safe for you.  Keep all follow-up visits as told by your health care provider. This is important. Contact a health care provider if:  You have  pain that does not get better with medicine.  You have a fever.  You have pain with bowel movements. Get help right away if:  You have abdominal swelling.  You have abdominal pain that gets  worse.  You have bad-smelling vaginal discharge, or an increased amount of vaginal discharge.  You have abnormal vaginal bleeding.  You have nausea and vomiting. Summary  Endometritis affects the lining of the uterus (endometrium) and is usually caused by an infection.  It is important to get treatment to prevent complications.  You have several treatment options for endometritis. Treatment may include antibiotics and IV fluids.  Take your antibiotic medicine as told by your health care provider. Do not stop taking the antibiotic even if you start to feel better.  Do not douche or have sex (including vaginal, oral, and anal sex) until your health care provider approves. This information is not intended to replace advice given to you by your health care provider. Make sure you discuss any questions you have with your health care provider. Document Released: 06/13/2001 Document Revised: 07/04/2016 Document Reviewed: 07/04/2016 Elsevier Interactive Patient Education  2017 [employer]

## 2018-01-04 LAB — URINE CULTURE: SPECIAL REQUESTS: NORMAL

## 2018-01-04 LAB — GC/CHLAMYDIA PROBE AMP (~~LOC~~) NOT AT ARMC
Chlamydia: NEGATIVE
Neisseria Gonorrhea: NEGATIVE

## 2018-01-04 LAB — HIV ANTIBODY (ROUTINE TESTING W REFLEX): HIV SCREEN 4TH GENERATION: NONREACTIVE

## 2018-01-14 ENCOUNTER — Emergency Department (HOSPITAL_COMMUNITY)
Admission: EM | Admit: 2018-01-14 | Discharge: 2018-01-14 | Disposition: A | Discharge status: Home / Self Care | Payer: BLUE CROSS/BLUE SHIELD | Attending: Emergency Medicine | Admitting: Emergency Medicine

## 2018-01-14 ENCOUNTER — Encounter (HOSPITAL_COMMUNITY): Payer: Self-pay | Admitting: Emergency Medicine

## 2018-01-14 DIAGNOSIS — Z5321 Procedure and treatment not carried out due to patient leaving prior to being seen by health care provider: Secondary | ICD-10-CM | POA: Diagnosis not present

## 2018-01-14 DIAGNOSIS — R1030 Lower abdominal pain, unspecified: Secondary | ICD-10-CM | POA: Diagnosis not present

## 2018-01-14 NOTE — ED Notes (Signed)
Notified by registration that patient left.  

## 2018-01-14 NOTE — ED Triage Notes (Signed)
Patient complaining of lower abdominal pain x 2 days. Denies vomiting or diarrhea. States she had miscarriage 2 months ago and has not had menstrual period since miscarriage. Denies unprotected sex since miscarriage.

## 2018-01-15 ENCOUNTER — Emergency Department (HOSPITAL_COMMUNITY): Payer: BLUE CROSS/BLUE SHIELD

## 2018-01-15 ENCOUNTER — Other Ambulatory Visit: Payer: Self-pay

## 2018-01-15 ENCOUNTER — Encounter (HOSPITAL_COMMUNITY): Payer: Self-pay | Admitting: *Deleted

## 2018-01-15 ENCOUNTER — Emergency Department (HOSPITAL_COMMUNITY)
Admission: EM | Admit: 2018-01-15 | Discharge: 2018-01-15 | Disposition: A | Discharge status: Home / Self Care | Payer: BLUE CROSS/BLUE SHIELD | Attending: Emergency Medicine | Admitting: Emergency Medicine

## 2018-01-15 DIAGNOSIS — R109 Unspecified abdominal pain: Secondary | ICD-10-CM | POA: Diagnosis not present

## 2018-01-15 DIAGNOSIS — B9689 Other specified bacterial agents as the cause of diseases classified elsewhere: Secondary | ICD-10-CM

## 2018-01-15 DIAGNOSIS — R102 Pelvic and perineal pain: Secondary | ICD-10-CM | POA: Insufficient documentation

## 2018-01-15 DIAGNOSIS — Z79899 Other long term (current) drug therapy: Secondary | ICD-10-CM | POA: Diagnosis not present

## 2018-01-15 DIAGNOSIS — B373 Candidiasis of vulva and vagina: Secondary | ICD-10-CM | POA: Insufficient documentation

## 2018-01-15 DIAGNOSIS — N760 Acute vaginitis: Secondary | ICD-10-CM | POA: Insufficient documentation

## 2018-01-15 DIAGNOSIS — N912 Amenorrhea, unspecified: Secondary | ICD-10-CM | POA: Insufficient documentation

## 2018-01-15 DIAGNOSIS — B3731 Acute candidiasis of vulva and vagina: Secondary | ICD-10-CM

## 2018-01-15 DIAGNOSIS — Z87891 Personal history of nicotine dependence: Secondary | ICD-10-CM | POA: Insufficient documentation

## 2018-01-15 LAB — COMPREHENSIVE METABOLIC PANEL
ALBUMIN: 3.6 g/dL (ref 3.5–5.0)
ALK PHOS: 58 U/L (ref 38–126)
ALT: 17 U/L (ref 0–44)
ANION GAP: 5 (ref 5–15)
AST: 20 U/L (ref 15–41)
BUN: 8 mg/dL (ref 6–20)
CALCIUM: 8.9 mg/dL (ref 8.9–10.3)
CHLORIDE: 108 mmol/L (ref 98–111)
CO2: 28 mmol/L (ref 22–32)
Creatinine, Ser: 0.6 mg/dL (ref 0.44–1.00)
GFR calc non Af Amer: >60 mL/min (ref >60)
GLUCOSE: 95 mg/dL (ref 70–99)
Potassium: 3.7 mmol/L (ref 3.5–5.1)
Sodium: 141 mmol/L (ref 135–145)
Total Bilirubin: 0.5 mg/dL (ref 0.3–1.2)
Total Protein: 6.8 g/dL (ref 6.5–8.1)

## 2018-01-15 LAB — WET PREP, GENITAL
Sperm: NONE SEEN
Trich, Wet Prep: NONE SEEN

## 2018-01-15 LAB — URINALYSIS, ROUTINE W REFLEX MICROSCOPIC
Bilirubin Urine: NEGATIVE
Glucose, UA: NEGATIVE mg/dL
Ketones, ur: NEGATIVE mg/dL
NITRITE: NEGATIVE
PH: 5 (ref 5.0–8.0)
Protein, ur: NEGATIVE mg/dL
SPECIFIC GRAVITY, URINE: 1.019 (ref 1.005–1.030)

## 2018-01-15 LAB — CBC
HCT: 38.2 % (ref 36.0–46.0)
HEMOGLOBIN: 12.4 g/dL (ref 12.0–15.0)
MCH: 31.6 pg (ref 26.0–34.0)
MCHC: 32.5 g/dL (ref 30.0–36.0)
MCV: 97.4 fL (ref 78.0–100.0)
PLATELETS: 316 10*3/uL (ref 150–400)
RBC: 3.92 MIL/uL (ref 3.87–5.11)
RDW: 13.1 % (ref 11.5–15.5)
WBC: 6.9 10*3/uL (ref 4.0–10.5)

## 2018-01-15 LAB — PREGNANCY, URINE: Preg Test, Ur: NEGATIVE

## 2018-01-15 LAB — LIPASE, BLOOD: LIPASE: 27 U/L (ref 11–51)

## 2018-01-15 LAB — HCG, QUANTITATIVE, PREGNANCY: hCG, Beta Chain, Quant, S: 1 m[IU]/mL (ref <5)

## 2018-01-15 MED ORDER — HYDROCODONE-ACETAMINOPHEN 5-325 MG PO TABS: 1.0000 | ORAL_TABLET | ORAL | 0 refills | Status: DC | PRN

## 2018-01-15 MED ORDER — IBUPROFEN 600 MG PO TABS: 600.0000 mg | ORAL_TABLET | Freq: Four times a day (QID) | ORAL | 0 refills | Status: DC | PRN

## 2018-01-15 MED ORDER — METRONIDAZOLE 500 MG PO TABS: 500.0000 mg | ORAL_TABLET | Freq: Two times a day (BID) | ORAL | 0 refills | Status: DC

## 2018-01-15 MED ORDER — MORPHINE SULFATE (PF) 4 MG/ML IV SOLN
4.0000 mg | Freq: Once | INTRAVENOUS | Status: AC
  Administered 2018-01-15: 4 mg via INTRAVENOUS
  Filled 2018-01-15: qty 1

## 2018-01-15 MED ORDER — METRONIDAZOLE 500 MG PO TABS
500.0000 mg | ORAL_TABLET | Freq: Once | ORAL | Status: AC
  Administered 2018-01-15: 500 mg via ORAL
  Filled 2018-01-15: qty 1

## 2018-01-15 MED ORDER — SODIUM CHLORIDE 0.9 % IV BOLUS
1000.0000 mL | Freq: Once | INTRAVENOUS | Status: AC
  Administered 2018-01-15: 1000 mL via INTRAVENOUS

## 2018-01-15 MED ORDER — ONDANSETRON HCL 4 MG/2ML IJ SOLN
4.0000 mg | Freq: Once | INTRAMUSCULAR | Status: AC
  Administered 2018-01-15: 4 mg via INTRAVENOUS
  Filled 2018-01-15: qty 2

## 2018-01-15 MED ORDER — FLUCONAZOLE 150 MG PO TABS
150.0000 mg | ORAL_TABLET | Freq: Once | ORAL | Status: AC
  Administered 2018-01-15: 150 mg via ORAL
  Filled 2018-01-15: qty 1

## 2018-01-15 NOTE — ED Notes (Signed)
Follow up call made  No answer  01/15/18 1032  s Wei Poplaski rn

## 2018-01-15 NOTE — ED Triage Notes (Signed)
Pt c/o lower abdominal pain x 3 days. Denies n/v/d.

## 2018-01-15 NOTE — ED Provider Notes (Signed)
[facility] EMERGENCY DEPARTMENT Provider Note   CSN: [id number] Arrival date & time: 01/15/18  1600     History   Chief Complaint Chief Complaint  Patient presents with  . Abdominal Pain    HPI Alexis Berg is a 27 y.o. female.  Pt presents to the ED today with abdominal pain.  The pt had an elective abortion in early June.  She has had abdominal cramping and no period since then.  She did go to [facility] hospital on July 4 for the same.  There, she was treated for endometritis and BV with rocephin and flagyl.  She said she's no better.     Past Medical History:  Diagnosis Date  . MRSA (methicillin resistant staph aureus) culture positive     There are no active problems to display for this patient.   Past Surgical History:  Procedure Laterality Date  . DILATION AND CURETTAGE OF UTERUS    . INCISION AND DRAINAGE       OB History    Gravida  4   Para  3   Term      Preterm      AB  1   Living  3     SAB  1   TAB      Ectopic      Multiple      Live Births               Home Medications    Prior to Admission medications   Medication Sig Start Date End Date Taking? Authorizing Provider  bisacodyl (DULCOLAX) 10 MG suppository Place 1 suppository (10 mg total) rectally as needed for moderate constipation. Patient not taking: Reported on 01/15/2018 11/24/17   Derwood Kaplan, MD  doxycycline (VIBRAMYCIN) 100 MG capsule Take 1 capsule (100 mg total) by mouth 2 (two) times daily. Patient not taking: Reported on 01/15/2018 01/03/18   Katrinka Blazing [place], CNM  HYDROcodone-acetaminophen (NORCO/VICODIN) 5-325 MG tablet Take 1 tablet by mouth every 4 (four) hours as needed. 01/15/18   Jacalyn Lefevre, MD  ibuprofen (ADVIL,MOTRIN) 600 MG tablet Take 1 tablet (600 mg total) by mouth every 6 (six) hours as needed. 01/15/18   Jacalyn Lefevre, MD  metroNIDAZOLE (FLAGYL) 500 MG tablet Take 1 tablet (500 mg total) by mouth 2 (two) times daily. 01/15/18   Jacalyn Lefevre, MD  traMADol (ULTRAM) 50 MG tablet Take 1-2 tablets (50-100 mg total) by mouth every 6 (six) hours as needed for severe pain. Patient not taking: Reported on 01/15/2018 01/03/18   Dorathy Kinsman, CNM    Family History No family history on file.  Social History Social History   Tobacco Use  . Smoking status: Former Games developer  . Smokeless tobacco: Never Used  Substance Use Topics  . Alcohol use: No  . Drug use: No     Allergies   Patient has no known allergies.   Review of Systems Review of Systems  Gastrointestinal: Positive for abdominal pain.  Genitourinary: Positive for pelvic pain and vaginal discharge.  All other systems reviewed and are negative.    Physical Exam Updated Vital Signs BP (!) 91/51   Pulse 62   Temp 98.1 F (36.7 C) (Oral)   Resp 17   Ht 5' (1.524 m)   Wt 104.3 kg (230 lb)   SpO2 100%   BMI 44.92 kg/m   Physical Exam  Constitutional: She is oriented to person, place, and time. She appears well-developed and well-nourished.  HENT:  Head: Normocephalic  and atraumatic.  Mouth/Throat: Oropharynx is clear and moist.  Eyes: Pupils are equal, round, and reactive to light. EOM are normal.  Cardiovascular: Normal rate, regular rhythm, normal heart sounds and intact distal pulses.  Pulmonary/Chest: Effort normal and breath sounds normal.  Abdominal: Normal appearance. There is tenderness in the suprapubic area.  Genitourinary: Uterus is tender. Cervix exhibits discharge. Right adnexum displays tenderness. Left adnexum displays tenderness. Vaginal discharge found.  Neurological: She is alert and oriented to person, place, and time.  Skin: Skin is warm. Capillary refill takes less than 2 seconds.  Psychiatric: She has a normal mood and affect. Her behavior is normal.  Nursing note and vitals reviewed.    ED Treatments / Results  Labs (all labs ordered are listed, but only abnormal results are displayed) Labs Reviewed  WET PREP, GENITAL -  Abnormal; Notable for the following components:      Result Value   Yeast Wet Prep HPF POC PRESENT (*)    Clue Cells Wet Prep HPF POC PRESENT (*)    WBC, Wet Prep HPF POC MANY (*)    All other components within normal limits  URINALYSIS, ROUTINE W REFLEX MICROSCOPIC - Abnormal; Notable for the following components:   APPearance HAZY (*)    Hgb urine dipstick SMALL (*)    Leukocytes, UA MODERATE (*)    Bacteria, UA RARE (*)    All other components within normal limits  LIPASE, BLOOD  COMPREHENSIVE METABOLIC PANEL  CBC  PREGNANCY, URINE  HCG, QUANTITATIVE, PREGNANCY  GC/CHLAMYDIA PROBE AMP ([place]) NOT [facility]    EKG None  Radiology [place] Transvaginal Non-ob  Result Date: 01/15/2018 CLINICAL DATA:  Initial evaluation for acute pelvic pain. EXAM: TRANSABDOMINAL AND TRANSVAGINAL ULTRASOUND OF PELVIS DOPPLER ULTRASOUND OF OVARIES TECHNIQUE: Both transabdominal and transvaginal ultrasound examinations of the pelvis were performed. Transabdominal technique was performed for global imaging of the pelvis including uterus, ovaries, adnexal regions, and pelvic cul-de-sac. It was necessary to proceed with endovaginal exam following the transabdominal exam to visualize the uterus, endometrium, and ovaries. Color and duplex Doppler ultrasound was utilized to evaluate blood flow to the ovaries. COMPARISON:  None. FINDINGS: Uterus Measurements: 8.3 x 4.6 x 4.4 cm. No fibroids or other mass visualized. Endometrium Thickness: 2 mm.  No focal abnormality visualized. Right ovary Measurements: 3.0 x 1.8 x 1.8 cm. Normal appearance/no adnexal mass. Small 1.2 cm cyst most consistent with a normal physiologic cyst/dominant follicle. Left ovary Measurements: 4.3 x 2.1 x 2.8 cm. Normal appearance/no adnexal mass. Pulsed Doppler evaluation of both ovaries demonstrates normal low-resistance arterial and venous waveforms. Other findings No abnormal free fluid. IMPRESSION: Normal pelvic ultrasound. No evidence for  torsion or other acute abnormality. Electronically Signed   By: Rise MuBenjamin  McClintock M.D.   On: 01/15/2018 21:55   [place] Pelvis Complete  Result Date: 01/15/2018 CLINICAL DATA:  Initial evaluation for acute pelvic pain. EXAM: TRANSABDOMINAL AND TRANSVAGINAL ULTRASOUND OF PELVIS DOPPLER ULTRASOUND OF OVARIES TECHNIQUE: Both transabdominal and transvaginal ultrasound examinations of the pelvis were performed. Transabdominal technique was performed for global imaging of the pelvis including uterus, ovaries, adnexal regions, and pelvic cul-de-sac. It was necessary to proceed with endovaginal exam following the transabdominal exam to visualize the uterus, endometrium, and ovaries. Color and duplex Doppler ultrasound was utilized to evaluate blood flow to the ovaries. COMPARISON:  None. FINDINGS: Uterus Measurements: 8.3 x 4.6 x 4.4 cm. No fibroids or other mass visualized. Endometrium Thickness: 2 mm.  No focal abnormality visualized. Right ovary Measurements:  3.0 x 1.8 x 1.8 cm. Normal appearance/no adnexal mass. Small 1.2 cm cyst most consistent with a normal physiologic cyst/dominant follicle. Left ovary Measurements: 4.3 x 2.1 x 2.8 cm. Normal appearance/no adnexal mass. Pulsed Doppler evaluation of both ovaries demonstrates normal low-resistance arterial and venous waveforms. Other findings No abnormal free fluid. IMPRESSION: Normal pelvic ultrasound. No evidence for torsion or other acute abnormality. Electronically Signed   By: Rise Mu M.D.   On: 01/15/2018 21:55   [place] Art/ven Flow Abd Pelv Doppler  Result Date: 01/15/2018 CLINICAL DATA:  Initial evaluation for acute pelvic pain. EXAM: TRANSABDOMINAL AND TRANSVAGINAL ULTRASOUND OF PELVIS DOPPLER ULTRASOUND OF OVARIES TECHNIQUE: Both transabdominal and transvaginal ultrasound examinations of the pelvis were performed. Transabdominal technique was performed for global imaging of the pelvis including uterus, ovaries, adnexal regions, and pelvic  cul-de-sac. It was necessary to proceed with endovaginal exam following the transabdominal exam to visualize the uterus, endometrium, and ovaries. Color and duplex Doppler ultrasound was utilized to evaluate blood flow to the ovaries. COMPARISON:  None. FINDINGS: Uterus Measurements: 8.3 x 4.6 x 4.4 cm. No fibroids or other mass visualized. Endometrium Thickness: 2 mm.  No focal abnormality visualized. Right ovary Measurements: 3.0 x 1.8 x 1.8 cm. Normal appearance/no adnexal mass. Small 1.2 cm cyst most consistent with a normal physiologic cyst/dominant follicle. Left ovary Measurements: 4.3 x 2.1 x 2.8 cm. Normal appearance/no adnexal mass. Pulsed Doppler evaluation of both ovaries demonstrates normal low-resistance arterial and venous waveforms. Other findings No abnormal free fluid. IMPRESSION: Normal pelvic ultrasound. No evidence for torsion or other acute abnormality. Electronically Signed   By: Rise Mu M.D.   On: 01/15/2018 21:55    Procedures Procedures (including critical care time)  Medications Ordered in ED Medications  fluconazole (DIFLUCAN) tablet 150 mg (has no administration in time range)  metroNIDAZOLE (FLAGYL) tablet 500 mg (has no administration in time range)  sodium chloride 0.9 % bolus 1,000 mL (0 mLs Intravenous Stopped 01/15/18 2149)  ondansetron (ZOFRAN) injection 4 mg (4 mg Intravenous Given 01/15/18 2051)  morphine 4 MG/ML injection 4 mg (4 mg Intravenous Given 01/15/18 2050)     Initial Impression / Assessment and Plan / ED Course  I have reviewed the triage vital signs and the nursing notes.  Pertinent labs & imaging results that were available during my care of the patient were reviewed by me and considered in my medical decision making (see chart for details).    Pt is relieved that the pelvic US was ok.  She will be treated with flagyl again for BV and is given a dose of diflucan in ED.  She is instructed to f/u with obgyn and to return if  worse.  Final Clinical Impressions(s) / ED Diagnoses   Final diagnoses:  Bacterial vaginosis  Vaginal candidiasis  Pelvic pain  Amenorrhea    ED Discharge Orders        Ordered    metroNIDAZOLE (FLAGYL) 500 MG tablet  2 times daily     01/15/18 2212    ibuprofen (ADVIL,MOTRIN) 600 MG tablet  Every 6 hours PRN     01/15/18 2212    HYDROcodone-acetaminophen (NORCO/VICODIN) 5-325 MG tablet  Every 4 hours PRN     01/15/18 2212       Jacalyn Lefevre, MD 01/15/18 2213

## 2018-01-17 LAB — GC/CHLAMYDIA PROBE AMP (~~LOC~~) NOT AT ARMC
Chlamydia: NEGATIVE
Neisseria Gonorrhea: NEGATIVE

## 2018-02-17 ENCOUNTER — Encounter (HOSPITAL_COMMUNITY): Payer: Self-pay | Admitting: *Deleted

## 2018-02-17 ENCOUNTER — Other Ambulatory Visit: Payer: Self-pay

## 2018-02-17 ENCOUNTER — Emergency Department (HOSPITAL_COMMUNITY)
Admission: EM | Admit: 2018-02-17 | Discharge: 2018-02-17 | Disposition: A | Discharge status: Home / Self Care | Payer: BLUE CROSS/BLUE SHIELD | Attending: Emergency Medicine | Admitting: Emergency Medicine

## 2018-02-17 DIAGNOSIS — Z5321 Procedure and treatment not carried out due to patient leaving prior to being seen by health care provider: Secondary | ICD-10-CM | POA: Diagnosis not present

## 2018-02-17 DIAGNOSIS — L299 Pruritus, unspecified: Secondary | ICD-10-CM | POA: Insufficient documentation

## 2018-02-17 DIAGNOSIS — R21 Rash and other nonspecific skin eruption: Secondary | ICD-10-CM | POA: Insufficient documentation

## 2018-02-17 NOTE — ED Triage Notes (Signed)
Pt c/o itching all over x 1 week. Pt has been using Hydrocortisone cream and Benadryl to help relieve itching. Pt doesn't know of any new food, detergent or hygiene items that she has used.

## 2018-02-17 NOTE — ED Notes (Signed)
Called to be brought back to room x 2. No answer from waiting room.

## 2018-02-17 NOTE — ED Notes (Addendum)
Called to be brought back to room x 1. No answer from waiting room.

## 2018-02-18 ENCOUNTER — Encounter (HOSPITAL_COMMUNITY): Payer: Self-pay | Admitting: Emergency Medicine

## 2018-02-18 ENCOUNTER — Emergency Department (HOSPITAL_COMMUNITY)
Admission: EM | Admit: 2018-02-18 | Discharge: 2018-02-18 | Disposition: A | Discharge status: Home / Self Care | Payer: BLUE CROSS/BLUE SHIELD | Attending: Emergency Medicine | Admitting: Emergency Medicine

## 2018-02-18 ENCOUNTER — Other Ambulatory Visit: Payer: Self-pay

## 2018-02-18 DIAGNOSIS — Z79899 Other long term (current) drug therapy: Secondary | ICD-10-CM | POA: Insufficient documentation

## 2018-02-18 DIAGNOSIS — L299 Pruritus, unspecified: Secondary | ICD-10-CM

## 2018-02-18 DIAGNOSIS — Z87891 Personal history of nicotine dependence: Secondary | ICD-10-CM | POA: Insufficient documentation

## 2018-02-18 LAB — COMPREHENSIVE METABOLIC PANEL
ALBUMIN: 4 g/dL (ref 3.5–5.0)
ALT: 19 U/L (ref 0–44)
AST: 19 U/L (ref 15–41)
Alkaline Phosphatase: 51 U/L (ref 38–126)
Anion gap: 10 (ref 5–15)
BILIRUBIN TOTAL: 0.5 mg/dL (ref 0.3–1.2)
BUN: 9 mg/dL (ref 6–20)
CALCIUM: 9.1 mg/dL (ref 8.9–10.3)
CO2: 25 mmol/L (ref 22–32)
Chloride: 105 mmol/L (ref 98–111)
Creatinine, Ser: 0.63 mg/dL (ref 0.44–1.00)
GFR calc Af Amer: >60 mL/min (ref >60)
GFR calc non Af Amer: >60 mL/min (ref >60)
GLUCOSE: 83 mg/dL (ref 70–99)
Potassium: 3.9 mmol/L (ref 3.5–5.1)
Sodium: 140 mmol/L (ref 135–145)
TOTAL PROTEIN: 7.2 g/dL (ref 6.5–8.1)

## 2018-02-18 MED ORDER — HYDROXYZINE PAMOATE 25 MG PO CAPS: 25.0000 mg | ORAL_CAPSULE | Freq: Four times a day (QID) | ORAL | 0 refills | Status: DC | PRN

## 2018-02-18 MED ORDER — DEXAMETHASONE 4 MG PO TABS: 4.0000 mg | ORAL_TABLET | Freq: Two times a day (BID) | ORAL | 0 refills | Status: DC

## 2018-02-18 MED ORDER — FAMOTIDINE 20 MG PO TABS
20.0000 mg | ORAL_TABLET | Freq: Once | ORAL | Status: AC
  Administered 2018-02-18: 20 mg via ORAL
  Filled 2018-02-18: qty 1

## 2018-02-18 MED ORDER — HYDROXYZINE HCL 25 MG PO TABS
50.0000 mg | ORAL_TABLET | Freq: Once | ORAL | Status: AC
  Administered 2018-02-18: 50 mg via ORAL
  Filled 2018-02-18: qty 2

## 2018-02-18 MED ORDER — DEXAMETHASONE SODIUM PHOSPHATE 10 MG/ML IJ SOLN
10.0000 mg | Freq: Once | INTRAMUSCULAR | Status: AC
  Administered 2018-02-18: 10 mg via INTRAMUSCULAR
  Filled 2018-02-18: qty 1

## 2018-02-18 NOTE — ED Triage Notes (Signed)
Patient complaining of generalized itching x 1 week.

## 2018-02-18 NOTE — Discharge Instructions (Signed)
Please use Decadron 2 times daily with a meal.  Use Claritin or Allegra each morning for itching.  May use Vistaril at bedtime.  May use Vistaril every 6 hours if needed for severe itching and burning.  Please see Ms. Register for dermatology evaluation if not improving.

## 2018-02-18 NOTE — ED Provider Notes (Signed)
[facility] EMERGENCY DEPARTMENT Provider Note   CSN: [id number] Arrival date & time: 02/18/18  0830     History   Chief Complaint No chief complaint on file.   HPI Alexis Berg is a 27 y.o. female.  Patient is a 27 year old female who presents to the emergency department with a complaint of itching.  Patient states this problem is been going on for over a week.  She has itching primarily from her scalp down to her lower abdomen.  The itching also involves her arms.  The patient denies any new clothing, soap, dryer sheets, detergent, medications, food, or new environment.  No history of liver disease or other metabolic dysfunctions.She is not aware of any insect bites.  She does not have any known allergies.  She presents to the emergency department because she says she has tried over-the-counter medications and now feels that she cannot take this anymore.  The history is provided by the patient.    Past Medical History:  Diagnosis Date  . MRSA (methicillin resistant staph aureus) culture positive     There are no active problems to display for this patient.   Past Surgical History:  Procedure Laterality Date  . DILATION AND CURETTAGE OF UTERUS    . INCISION AND DRAINAGE       OB History    Gravida  4   Para  3   Term      Preterm      AB  1   Living  3     SAB  1   TAB      Ectopic      Multiple      Live Births               Home Medications    Prior to Admission medications   Medication Sig Start Date End Date Taking? Authorizing Provider  bisacodyl (DULCOLAX) 10 MG suppository Place 1 suppository (10 mg total) rectally as needed for moderate constipation. Patient not taking: Reported on 01/15/2018 11/24/17   Derwood KaplanNanavati, Ankit, MD  doxycycline (VIBRAMYCIN) 100 MG capsule Take 1 capsule (100 mg total) by mouth 2 (two) times daily. Patient not taking: Reported on 01/15/2018 01/03/18   Katrinka BlazingSmith, [place], CNM  HYDROcodone-acetaminophen  (NORCO/VICODIN) 5-325 MG tablet Take 1 tablet by mouth every 4 (four) hours as needed. 01/15/18   Jacalyn LefevreHaviland, Julie, MD  ibuprofen (ADVIL,MOTRIN) 600 MG tablet Take 1 tablet (600 mg total) by mouth every 6 (six) hours as needed. 01/15/18   Jacalyn LefevreHaviland, Julie, MD  metroNIDAZOLE (FLAGYL) 500 MG tablet Take 1 tablet (500 mg total) by mouth 2 (two) times daily. 01/15/18   Jacalyn LefevreHaviland, Julie, MD  traMADol (ULTRAM) 50 MG tablet Take 1-2 tablets (50-100 mg total) by mouth every 6 (six) hours as needed for severe pain. Patient not taking: Reported on 01/15/2018 01/03/18   Dorathy KinsmanSmith, [place], CNM    Family History No family history on file.  Social History Social History   Tobacco Use  . Smoking status: Former Games developermoker  . Smokeless tobacco: Never Used  Substance Use Topics  . Alcohol use: No  . Drug use: No     Allergies   Patient has no known allergies.   Review of Systems Review of Systems  Constitutional: Negative for activity change.       All ROS Neg except as noted in HPI  HENT: Negative for nosebleeds.   Eyes: Negative for photophobia and discharge.  Respiratory: Negative for cough, shortness of breath and wheezing.  Cardiovascular: Negative for chest pain and palpitations.  Gastrointestinal: Negative for abdominal pain and blood in stool.  Genitourinary: Negative for dysuria, frequency and hematuria.  Musculoskeletal: Negative for arthralgias, back pain and neck pain.  Skin: Negative.        itching  Neurological: Negative for dizziness, seizures and speech difficulty.  Psychiatric/Behavioral: Negative for confusion and hallucinations.     Physical Exam Updated Vital Signs LMP 01/31/2018   Physical Exam  Constitutional: She is oriented to person, place, and time. She appears well-developed and well-nourished.  Non-toxic appearance.  HENT:  Head: Normocephalic.  Right Ear: Tympanic membrane and external ear normal.  Left Ear: Tympanic membrane and external ear normal.  Eyes: Pupils  are equal, round, and reactive to light. EOM and lids are normal.  Neck: Normal range of motion. Neck supple. Carotid bruit is not present.  Cardiovascular: Normal rate, regular rhythm, normal heart sounds, intact distal pulses and normal pulses.  Pulmonary/Chest: Breath sounds normal. No respiratory distress.  Abdominal: Soft. Bowel sounds are normal. There is no tenderness. There is no guarding.  Musculoskeletal: Normal range of motion.  Lymphadenopathy:       Head (right side): No submandibular adenopathy present.       Head (left side): No submandibular adenopathy present.    She has no cervical adenopathy.  Neurological: She is alert and oriented to person, place, and time. She has normal strength. No cranial nerve deficit or sensory deficit.  Skin: Skin is warm and dry.  Patient complains of itching, and sensation of her skin being on fire from her scalp down to her lower abdomen.  There are areas of increased redness on the back, at the right antecubital, and diffusely over the abdomen.  No significant rash appreciated.  In particular no rash on the palms of the hands or in the mouth.  Psychiatric: She has a normal mood and affect. Her speech is normal.  Nursing note and vitals reviewed.    ED Treatments / Results  Labs (all labs ordered are listed, but only abnormal results are displayed) Labs Reviewed - No data to display  EKG None  Radiology No results found.  Procedures Procedures (including critical care time)  Medications Ordered in ED Medications - No data to display   Initial Impression / Assessment and Plan / ED Course  I have reviewed the triage vital signs and the nursing notes.  Pertinent labs & imaging results that were available during my care of the patient were reviewed by me and considered in my medical decision making (see chart for details).       Final Clinical Impressions(s) / ED Diagnoses MDM  Pulse oximetry is 99% on room air.  Within  normal limits by my interpretation.  The patient states that she has not been out of the country recently.  She has not been any new environments recently.  There is been no recent changes in medications, foods, detergent, shampoo, or clothing.  The problem seems to be getting progressively worse according to the patient.  Patient treated in the emergency department with intramuscular Decadron, oral Vistaril, and oral Pepcid.  Review of the patient's medications do not show any medications popular for causing systemic itch.  The patient has a history of methicillin-resistant staph, but no other skin related problems.  She states that she had symptoms of an upper respiratory infection about 3 weeks ago, but nothing recently.  Comprehensive metabolic panel obtained to evaluate the liver function.  There was a  delay in the resulting of the conference of metabolic panel.  Call placed to the lab.  Lab technicians report the results should be available in the next 10 to 15 minutes.   Final diagnoses:  Pruritus    ED Discharge Orders         Ordered    dexamethasone (DECADRON) 4 MG tablet  2 times daily with meals     02/18/18 1054    hydrOXYzine (VISTARIL) 25 MG capsule  Every 6 hours PRN     02/18/18 1054           Ivery QualeBryant, Mayuri Staples, PA-C 02/22/18 1036    Sabas SousBero, Michael M, MD 02/22/18 1051

## 2018-04-22 DIAGNOSIS — Z3009 Encounter for other general counseling and advice on contraception: Secondary | ICD-10-CM | POA: Diagnosis not present

## 2018-04-22 DIAGNOSIS — Z23 Encounter for immunization: Secondary | ICD-10-CM | POA: Diagnosis not present

## 2018-04-22 DIAGNOSIS — Z01419 Encounter for gynecological examination (general) (routine) without abnormal findings: Secondary | ICD-10-CM | POA: Diagnosis not present

## 2018-07-17 ENCOUNTER — Other Ambulatory Visit (HOSPITAL_COMMUNITY)
Admission: RE | Admit: 2018-07-17 | Discharge: 2018-07-17 | Disposition: A | Discharge status: Home / Self Care | Payer: BLUE CROSS/BLUE SHIELD | Source: Ambulatory Visit | Attending: Obstetrics and Gynecology | Admitting: Obstetrics and Gynecology

## 2018-07-17 ENCOUNTER — Ambulatory Visit (INDEPENDENT_AMBULATORY_CARE_PROVIDER_SITE_OTHER): Payer: BLUE CROSS/BLUE SHIELD | Admitting: Obstetrics and Gynecology

## 2018-07-17 ENCOUNTER — Encounter: Payer: Self-pay | Admitting: Obstetrics and Gynecology

## 2018-07-17 VITALS — BP 118/76 | HR 103 | Wt 250.0 lb

## 2018-07-17 DIAGNOSIS — Z124 Encounter for screening for malignant neoplasm of cervix: Secondary | ICD-10-CM | POA: Diagnosis not present

## 2018-07-17 DIAGNOSIS — Z113 Encounter for screening for infections with a predominantly sexual mode of transmission: Secondary | ICD-10-CM | POA: Insufficient documentation

## 2018-07-17 DIAGNOSIS — N390 Urinary tract infection, site not specified: Secondary | ICD-10-CM | POA: Diagnosis not present

## 2018-07-17 LAB — POCT URINALYSIS DIPSTICK
BILIRUBIN UA: NEGATIVE
GLUCOSE UA: NEGATIVE
Ketones, UA: NEGATIVE
Leukocytes, UA: NEGATIVE
Nitrite, UA: NEGATIVE
PH UA: 6 (ref 5.0–8.0)
Protein, UA: NEGATIVE
RBC UA: NEGATIVE
SPEC GRAV UA: 1.02 (ref 1.010–1.025)
UROBILINOGEN UA: NEGATIVE U/dL — AB

## 2018-07-17 NOTE — Progress Notes (Signed)
Obstetrics & Gynecology Office Visit   Chief Complaint  Patient presents with  . Urinary Tract Infection    Trouble holding urine   History of Present Illness: Urinary Tract Infection: Patient complains of frequency, incontinence and pain suprapubic cramping (sometimes associated with senstation to void, sometimes not) She has had symptoms for 3 weeks. Patient also complains of no other symptoms. Patient denies fever and vaginal discharge. Patient does not have a history of recurrent UTI.  Patient does have a distant history of pyelonephritis. She is a [id number]. She is status post SVD x 3.  Her youngest son is six.  Her biggest child was 6 lb 4 oz.  She did not have a vacuum or forceps delivery. She denies any symptoms of prolapse.  She does endorse urinary urgency with urge urinary incontinence.   Patient specifically requests pelvic exam and pap smear today. She cites no specific concerns.   Past Medical History:  Diagnosis Date  . MRSA (methicillin resistant staph aureus) culture positive    Past Surgical History:  Procedure Laterality Date  . DILATION AND CURETTAGE OF UTERUS    . INCISION AND DRAINAGE     Gynecologic History: LMP was 07/02/2018  Obstetric History: [id number], s/p SVD x 3  Family History  Problem Relation Age of Onset  . Breast cancer Mother 2475    Social History   Socioeconomic History  . Marital status: Single    Spouse name: Not on file  . Number of children: Not on file  . Years of education: Not on file  . Highest education level: Not on file  Occupational History  . Not on file  Social Needs  . Financial resource strain: Not on file  . Food insecurity:    Worry: Not on file    Inability: Not on file  . Transportation needs:    Medical: Not on file    Non-medical: Not on file  Tobacco Use  . Smoking status: Former Games developermoker  . Smokeless tobacco: Never Used  Substance and Sexual Activity  . Alcohol use: No  . Drug use: No  . Sexual activity: Yes     Birth control/protection: Condom    Comment: last depo shot 6 months ago  Lifestyle  . Physical activity:    Days per week: Not on file    Minutes per session: Not on file  . Stress: Not on file  Relationships  . Social connections:    Talks on phone: Not on file    Gets together: Not on file    Attends religious service: Not on file    Active member of club or organization: Not on file    Attends meetings of clubs or organizations: Not on file    Relationship status: Not on file  . Intimate partner violence:    Fear of current or ex partner: Not on file    Emotionally abused: Not on file    Physically abused: Not on file    Forced sexual activity: Not on file  Other Topics Concern  . Not on file  Social History Narrative  . Not on file   Allergies: No Known Allergies  Prior to Admission medications: denies    Review of Systems  Constitutional: Negative.   HENT: Negative.   Eyes: Negative.   Respiratory: Negative.   Cardiovascular: Negative.   Gastrointestinal: Negative.   Genitourinary: Negative.   Musculoskeletal: Negative.   Skin: Negative.   Neurological: Negative.   Psychiatric/Behavioral: Negative.  Physical Exam BP 118/76   Pulse (!) 103   Wt 250 lb (113.4 kg)   LMP 08/02/2017 (Exact Date)   BMI 48.82 kg/m  Patient's last menstrual period was 08/02/2017 (exact date). Physical Exam Constitutional:      General: She is not in acute distress.    Appearance: Normal appearance. She is well-developed.  Genitourinary:     Pelvic exam was performed with patient supine.     Vulva, inguinal canal, urethra, bladder, vagina, cervix, uterus, right adnexa and left adnexa normal.     Genitourinary Comments: Bimanual exam limited by patient's body habitus  HENT:     Head: Normocephalic and atraumatic.  Eyes:     General: No scleral icterus.    Conjunctiva/sclera: Conjunctivae normal.  Neck:     Musculoskeletal: Normal range of motion and neck supple.   Abdominal:     General: Bowel sounds are normal. There is no distension.     Palpations: Abdomen is soft. There is no mass.     Tenderness: There is no abdominal tenderness. There is no right CVA tenderness, left CVA tenderness, guarding or rebound.  Musculoskeletal: Normal range of motion.  Neurological:     General: No focal deficit present.     Mental Status: She is alert and oriented to person, place, and time.     Cranial Nerves: No cranial nerve deficit.  Skin:    General: Skin is warm and dry.     Findings: No erythema.  Psychiatric:        Mood and Affect: Mood normal.        Behavior: Behavior normal.        Judgment: Judgment normal.     Female chaperone present for pelvic and breast  portions of the physical exam  Assessment: 28 y.o. [phone] female here for  1. Urinary tract infection without hematuria, site unspecified   2. Pap smear for cervical cancer screening   3. Screen for STD (sexually transmitted disease)      Plan: Problem List Items Addressed This Visit    None    Visit Diagnoses    Urinary tract infection without hematuria, site unspecified    -  Primary   Relevant Orders   POCT urinalysis dipstick (Completed)   Urine Culture   Pap smear for cervical cancer screening       Relevant Orders   Cytology - PAP   Screen for STD (sexually transmitted disease)       Relevant Orders   Cytology - PAP      Symptoms most consistent with acute urinary tract infection, given the chronology of symptoms.  Will await results of culture. She is fine with waiting for those results.  Will treat, as indicated. Pap smear and STD screen performed today as she does not appear to routinely get screening exams.   20 minutes spent in face to face discussion with > 50% spent in counseling,management, and coordination of care of her urinary tract infection.   Thomasene Mohair, MD 07/17/2018 11:27 AM

## 2018-07-19 LAB — CYTOLOGY - PAP
CHLAMYDIA, DNA PROBE: NEGATIVE
DIAGNOSIS: NEGATIVE
NEISSERIA GONORRHEA: NEGATIVE

## 2018-07-19 LAB — URINE CULTURE

## 2018-07-22 ENCOUNTER — Telehealth: Payer: Self-pay

## 2018-07-22 NOTE — Telephone Encounter (Signed)
Pt calling for results [phone]  Pt aware urine cx and pap were negative.  Pt states she is still having trouble holding urine.  Can be reached at this same number.

## 2018-07-22 NOTE — Telephone Encounter (Signed)
I think it is fine to have her follow up.  The alternative would be a referral to urogynecology.  It sounds like she might need a deeper investigation at this point. I would favor a urogyneoclogy referral for her.  Let me know what she says. I'd probably send her to [facility] in [place]. Thanks!

## 2018-07-22 NOTE — Telephone Encounter (Signed)
Should pt come in for follow up?

## 2018-07-23 NOTE — Telephone Encounter (Signed)
Trying to call pt. Please let me know when she calls back or schedule follow up please

## 2018-08-01 NOTE — Telephone Encounter (Signed)
Cannot get in touch with pt. Please let me know when/if she calls [place] back

## 2018-08-13 ENCOUNTER — Other Ambulatory Visit: Payer: Self-pay

## 2018-08-13 ENCOUNTER — Ambulatory Visit
Admission: EM | Admit: 2018-08-13 | Discharge: 2018-08-13 | Disposition: A | Discharge status: Home / Self Care | Payer: BLUE CROSS/BLUE SHIELD | Attending: Family Medicine | Admitting: Family Medicine

## 2018-08-13 ENCOUNTER — Encounter: Payer: Self-pay | Admitting: Emergency Medicine

## 2018-08-13 DIAGNOSIS — H6501 Acute serous otitis media, right ear: Secondary | ICD-10-CM

## 2018-08-13 DIAGNOSIS — K029 Dental caries, unspecified: Secondary | ICD-10-CM

## 2018-08-13 MED ORDER — LIDOCAINE VISCOUS HCL 2 % MT SOLN: OROMUCOSAL | 0 refills | Status: DC

## 2018-08-13 MED ORDER — AMOXICILLIN 875 MG PO TABS: 875.0000 mg | ORAL_TABLET | Freq: Two times a day (BID) | ORAL | 0 refills | Status: DC

## 2018-08-13 MED ORDER — IBUPROFEN 800 MG PO TABS: 800.0000 mg | ORAL_TABLET | Freq: Three times a day (TID) | ORAL | 0 refills | Status: DC

## 2018-08-13 NOTE — ED Provider Notes (Signed)
[facility]    CSN: [id number] Arrival date & time: 08/13/18  0802     History   Chief Complaint Chief Complaint  Patient presents with  . Facial Pain    HPI Alexis Berg is a 28 y.o. female.   28 yo female with a c/o right sided facial pain that started in her right ear, now includes the jaw and cheek area. Denies any injuries, fevers, chills, swelling, shortness of breath, vision changes, numbness/tingling. States she has poor dentition.   The history is provided by the patient.    Past Medical History:  Diagnosis Date  . MRSA (methicillin resistant staph aureus) culture positive     There are no active problems to display for this patient.   Past Surgical History:  Procedure Laterality Date  . DILATION AND CURETTAGE OF UTERUS    . INCISION AND DRAINAGE      OB History    Gravida  4   Para  3   Term      Preterm      AB  1   Living  3     SAB  1   TAB      Ectopic      Multiple      Live Births               Home Medications    Prior to Admission medications   Medication Sig Start Date End Date Taking? Authorizing Provider  amoxicillin (AMOXIL) 875 MG tablet Take 1 tablet (875 mg total) by mouth 2 (two) times daily. 08/13/18   Payton Mccallumonty, Christionna Poland, MD  bisacodyl (DULCOLAX) 10 MG suppository Place 1 suppository (10 mg total) rectally as needed for moderate constipation. Patient not taking: Reported on 01/15/2018 11/24/17   Derwood KaplanNanavati, Ankit, MD  dexamethasone (DECADRON) 4 MG tablet Take 1 tablet (4 mg total) by mouth 2 (two) times daily with a meal. Patient not taking: Reported on 07/17/2018 02/18/18   Ivery QualeBryant, Hobson, PA-C  doxycycline (VIBRAMYCIN) 100 MG capsule Take 1 capsule (100 mg total) by mouth 2 (two) times daily. Patient not taking: Reported on 01/15/2018 01/03/18   Katrinka BlazingSmith, [place], CNM  HYDROcodone-acetaminophen (NORCO/VICODIN) 5-325 MG tablet Take 1 tablet by mouth every 4 (four) hours as needed. Patient not taking:  Reported on 02/18/2018 01/15/18   Jacalyn LefevreHaviland, Julie, MD  hydrOXYzine (VISTARIL) 25 MG capsule Take 1 capsule (25 mg total) by mouth every 6 (six) hours as needed for itching. Patient not taking: Reported on 07/17/2018 02/18/18   Ivery QualeBryant, Hobson, PA-C  ibuprofen (ADVIL,MOTRIN) 800 MG tablet Take 1 tablet (800 mg total) by mouth 3 (three) times daily. 08/13/18   Payton Mccallumonty, Leray Garverick, MD  lidocaine (XYLOCAINE) 2 % solution 20 ml to affected area in mouth q 6 hours prn 08/13/18   Payton Mccallumonty, Smriti Barkow, MD  metroNIDAZOLE (FLAGYL) 500 MG tablet Take 1 tablet (500 mg total) by mouth 2 (two) times daily. Patient not taking: Reported on 02/18/2018 01/15/18   Jacalyn LefevreHaviland, Julie, MD  traMADol (ULTRAM) 50 MG tablet Take 1-2 tablets (50-100 mg total) by mouth every 6 (six) hours as needed for severe pain. Patient not taking: Reported on 01/15/2018 01/03/18   Dorathy KinsmanSmith, [place], CNM    Family History Family History  Problem Relation Age of Onset  . Breast cancer Mother 8075  . Diabetes Mother   . Heart attack Father 4678  . Parkinson's disease Father     Social History Social History   Tobacco Use  . Smoking status: Former Smoker  Last attempt to quit: 08/13/2016    Years since quitting: 2.0  . Smokeless tobacco: Never Used  Substance Use Topics  . Alcohol use: No  . Drug use: No     Allergies   Patient has no known allergies.   Review of Systems Review of Systems   Physical Exam Triage Vital Signs ED Triage Vitals  Enc Vitals Group     BP 08/13/18 0815 (!) 106/54     Pulse Rate 08/13/18 0815 95     Resp 08/13/18 0815 16     Temp 08/13/18 0815 98.5 F (36.9 C)     Temp Source 08/13/18 0815 Oral     SpO2 08/13/18 0815 100 %     Weight 08/13/18 0814 220 lb (99.8 kg)     Height 08/13/18 0814 5' (1.524 m)     Head Circumference --      Peak Flow --      Pain Score 08/13/18 0814 10     Pain Loc --      Pain Edu? --      Excl. in GC? --    No data found.  Updated Vital Signs BP (!) 106/54 (BP Location:  Right Arm)   Pulse 95   Temp 98.5 F (36.9 C) (Oral)   Resp 16   Ht 5' (1.524 m)   Wt 99.8 kg   LMP 08/06/2018 (Approximate)   SpO2 100%   BMI 42.97 kg/m   Visual Acuity Right Eye Distance:   Left Eye Distance:   Bilateral Distance:    Right Eye Near:   Left Eye Near:    Bilateral Near:     Physical Exam Vitals signs and nursing note reviewed.  Constitutional:      General: She is not in acute distress.    Appearance: She is not toxic-appearing or diaphoretic.  HENT:     Right Ear: A middle ear effusion is present. Tympanic membrane is erythematous and bulging.     Left Ear: Tympanic membrane normal.     Mouth/Throat:     Mouth: Mucous membranes are moist.     Dentition: Abnormal dentition. Dental caries present.     Pharynx: No oropharyngeal exudate or posterior oropharyngeal erythema.  Neck:     Musculoskeletal: Normal range of motion and neck supple.  Lymphadenopathy:     Cervical: No cervical adenopathy.  Neurological:     Mental Status: She is alert.      UC Treatments / Results  Labs (all labs ordered are listed, but only abnormal results are displayed) Labs Reviewed - No data to display  EKG None  Radiology No results found.  Procedures Procedures (including critical care time)  Medications Ordered in UC Medications - No data to display  Initial Impression / Assessment and Plan / UC Course  I have reviewed the triage vital signs and the nursing notes.  Pertinent labs & imaging results that were available during my care of the patient were reviewed by me and considered in my medical decision making (see chart for details).      Final Clinical Impressions(s) / UC Diagnoses   Final diagnoses:  Right acute serous otitis media, recurrence not specified  Dental cavity    ED Prescriptions    Medication Sig Dispense Auth. Provider   amoxicillin (AMOXIL) 875 MG tablet Take 1 tablet (875 mg total) by mouth 2 (two) times daily. 20 tablet Nobie Alleyne,  Pamala Hurryrlando, MD   lidocaine (XYLOCAINE) 2 % solution 20 ml to  affected area in mouth q 6 hours prn 100 mL Payton Mccallum, MD   ibuprofen (ADVIL,MOTRIN) 800 MG tablet Take 1 tablet (800 mg total) by mouth 3 (three) times daily. 30 tablet Payton Mccallum, MD      1. diagnosis reviewed with patient 2. rx as per orders above; reviewed possible side effects, interactions, risks and benefits  3. Recommend supportive treatment with otc tylenol 4. Recommend follow up with dentist 5. Follow-up here prn    Controlled Substance Prescriptions [place] Controlled Substance Registry consulted? Not Applicable   Payton Mccallum, MD 08/13/18 [phone]

## 2018-08-13 NOTE — ED Triage Notes (Signed)
Patient in today c/o right sided facial pain from the forehead all the way down to the jaw x 2 days.

## 2018-08-23 ENCOUNTER — Telehealth: Payer: Self-pay | Admitting: Obstetrics and Gynecology

## 2018-08-23 NOTE — Telephone Encounter (Signed)
Patient is calling because she has a yeast infection. She was given Amoxicillin for an ear infection and now has developed a yeast infection and was wondering if Dr. Jean Rosenthal wold send something in. Patient refused to schedule appointment at this time and is aware SDJ is out of office. Please advise

## 2018-08-23 NOTE — Telephone Encounter (Signed)
Error

## 2018-08-25 ENCOUNTER — Encounter: Payer: Self-pay | Admitting: Gynecology

## 2018-08-25 ENCOUNTER — Ambulatory Visit
Admission: EM | Admit: 2018-08-25 | Discharge: 2018-08-25 | Disposition: A | Discharge status: Home / Self Care | Payer: BLUE CROSS/BLUE SHIELD | Attending: Family Medicine | Admitting: Family Medicine

## 2018-08-25 DIAGNOSIS — N760 Acute vaginitis: Secondary | ICD-10-CM | POA: Diagnosis not present

## 2018-08-25 DIAGNOSIS — B9689 Other specified bacterial agents as the cause of diseases classified elsewhere: Secondary | ICD-10-CM | POA: Diagnosis not present

## 2018-08-25 LAB — URINALYSIS, COMPLETE (UACMP) WITH MICROSCOPIC
BILIRUBIN URINE: NEGATIVE
Glucose, UA: NEGATIVE mg/dL
Ketones, ur: NEGATIVE mg/dL
LEUKOCYTE UA: NEGATIVE
Nitrite: NEGATIVE
Protein, ur: NEGATIVE mg/dL
pH: 5.5 (ref 5.0–8.0)

## 2018-08-25 LAB — WET PREP, GENITAL
SPERM: NONE SEEN
TRICH WET PREP: NONE SEEN
YEAST WET PREP: NONE SEEN

## 2018-08-25 MED ORDER — METRONIDAZOLE 500 MG PO TABS: 500.0000 mg | ORAL_TABLET | Freq: Two times a day (BID) | ORAL | 0 refills | Status: DC

## 2018-08-25 MED ORDER — METRONIDAZOLE 500 MG PO TABS
500.0000 mg | ORAL_TABLET | Freq: Once | ORAL | Status: AC
  Administered 2018-08-25: 500 mg via ORAL

## 2018-08-25 NOTE — ED Triage Notes (Addendum)
Patient c/o was seen x 1 weeks ago and was given an antibiotic which she stated given her an yeast infection.

## 2018-08-25 NOTE — ED Provider Notes (Signed)
[facility]    CSN: [id number] Arrival date & time: 08/25/18  6950  History   Chief Complaint Vaginal itching  HPI  28 year old female presents with vaginal itching.  Patient recently seen here on 2/11.  She was placed on amoxicillin.  Patient states that she has subsequently developed vaginal itching.  She is concerned that she has yeast infection.  No medications interventions tried.  She believes that this is been brought on by the amoxicillin.  No known relieving factors.  No other reported symptoms.  No other complaints.  PMH, Surgical Hx, Family Hx, Social History reviewed and updated as below.  Past Medical History:  Diagnosis Date  . MRSA (methicillin resistant staph aureus) culture positive    Past Surgical History:  Procedure Laterality Date  . DILATION AND CURETTAGE OF UTERUS    . INCISION AND DRAINAGE      OB History    Gravida  4   Para  3   Term      Preterm      AB  1   Living  3     SAB  1   TAB      Ectopic      Multiple      Live Births              Home Medications    Prior to Admission medications   Medication Sig Start Date End Date Taking? Authorizing Provider  bisacodyl (DULCOLAX) 10 MG suppository Place 1 suppository (10 mg total) rectally as needed for moderate constipation. Patient not taking: Reported on 01/15/2018 11/24/17   Derwood Kaplan, MD  doxycycline (VIBRAMYCIN) 100 MG capsule Take 1 capsule (100 mg total) by mouth 2 (two) times daily. Patient not taking: Reported on 01/15/2018 01/03/18   Katrinka Blazing, [place], CNM  ibuprofen (ADVIL,MOTRIN) 800 MG tablet Take 1 tablet (800 mg total) by mouth 3 (three) times daily. 08/13/18   Payton Mccallum, MD  metroNIDAZOLE (FLAGYL) 500 MG tablet Take 1 tablet (500 mg total) by mouth 2 (two) times daily. 08/25/18   Tommie Sams, DO    Family History Family History  Problem Relation Age of Onset  . Breast cancer Mother 92  . Diabetes Mother   . Heart attack Father 28  .  Parkinson's disease Father     Social History Social History   Tobacco Use  . Smoking status: Former Smoker    Last attempt to quit: 08/13/2016    Years since quitting: 2.0  . Smokeless tobacco: Never Used  Substance Use Topics  . Alcohol use: No  . Drug use: No     Allergies   Patient has no known allergies.   Review of Systems Review of Systems  Constitutional: Negative.   Genitourinary:       Vaginal itching.   Physical Exam Triage Vital Signs ED Triage Vitals [08/25/18 0958]  Enc Vitals Group     BP 108/77     Pulse Rate 86     Resp 18     Temp 98.1 F (36.7 C)     Temp Source Oral     SpO2 99 %     Weight 225 lb (102.1 kg)     Height      Head Circumference      Peak Flow      Pain Score 9     Pain Loc      Pain Edu?      Excl. in GC?  Updated Vital Signs BP 108/77 (BP Location: Left Arm)   Pulse 86   Temp 98.1 F (36.7 C) (Oral)   Resp 18   Wt 102.1 kg   LMP 08/24/2018   SpO2 99%   BMI 43.94 kg/m   Visual Acuity Right Eye Distance:   Left Eye Distance:   Bilateral Distance:    Right Eye Near:   Left Eye Near:    Bilateral Near:     Physical Exam Vitals signs and nursing note reviewed.  Constitutional:      General: She is not in acute distress.    Appearance: Normal appearance. She is obese.  HENT:     Head: Normocephalic and atraumatic.  Eyes:     General:        Right eye: No discharge.        Left eye: No discharge.     Conjunctiva/sclera: Conjunctivae normal.  Cardiovascular:     Rate and Rhythm: Normal rate and regular rhythm.  Pulmonary:     Effort: Pulmonary effort is normal.     Breath sounds: Normal breath sounds.  Neurological:     Mental Status: She is alert.  Psychiatric:        Mood and Affect: Mood normal.        Behavior: Behavior normal.    UC Treatments / Results  Labs (all labs ordered are listed, but only abnormal results are displayed) Labs Reviewed  WET PREP, GENITAL - Abnormal; Notable for the  following components:      Result Value   Clue Cells Wet Prep HPF POC PRESENT (*)    WBC, Wet Prep HPF POC FEW (*)    All other components within normal limits  URINALYSIS, COMPLETE (UACMP) WITH MICROSCOPIC - Abnormal; Notable for the following components:   APPearance HAZY (*)    Specific Gravity, Urine >1.030 (*)    Hgb urine dipstick MODERATE (*)    Bacteria, UA RARE (*)    All other components within normal limits    EKG None  Radiology No results found.  Procedures Procedures (including critical care time)  Medications Ordered in UC Medications  metroNIDAZOLE (FLAGYL) tablet 500 mg (has no administration in time range)    Initial Impression / Assessment and Plan / UC Course  I have reviewed the triage vital signs and the nursing notes.  Pertinent labs & imaging results that were available during my care of the patient were reviewed by me and considered in my medical decision making (see chart for details).    28 year old female presents with vaginal itching. Wet prep with BV. Treating with flagyl.   Final Clinical Impressions(s) / UC Diagnoses   Final diagnoses:  Bacterial vaginosis   Discharge Instructions   None    ED Prescriptions    Medication Sig Dispense Auth. Provider   metroNIDAZOLE (FLAGYL) 500 MG tablet Take 1 tablet (500 mg total) by mouth 2 (two) times daily. 14 tablet Tommie Sams, DO     Controlled Substance Prescriptions [place] Controlled Substance Registry consulted? Not Applicable   Tommie Sams, DO 08/25/18 1039

## 2018-08-26 NOTE — Telephone Encounter (Signed)
Pt needs an appt to makes ure its Yeast. Looks like she was seen yesterday. Let me know if she calls back/schedule her an appt please

## 2018-09-05 ENCOUNTER — Telehealth: Payer: Self-pay | Admitting: Family Medicine

## 2018-09-05 ENCOUNTER — Telehealth: Payer: Self-pay | Admitting: Emergency Medicine

## 2018-09-05 NOTE — Telephone Encounter (Signed)
Patient did not complete course of flagyl. Call for additional medication. Will call in refill. Cannot send electronically due to patient's demographic data being too long.

## 2018-09-05 NOTE — Telephone Encounter (Signed)
Called in new prescription for Flagyl 500mg  1 po twice daily x 7 days #14 no refills per Dr. Adriana Simas.

## 2018-09-17 ENCOUNTER — Encounter: Payer: Self-pay | Admitting: Certified Nurse Midwife

## 2018-09-17 ENCOUNTER — Ambulatory Visit: Payer: BLUE CROSS/BLUE SHIELD | Admitting: Certified Nurse Midwife

## 2018-09-17 ENCOUNTER — Ambulatory Visit (INDEPENDENT_AMBULATORY_CARE_PROVIDER_SITE_OTHER): Payer: BLUE CROSS/BLUE SHIELD | Admitting: Certified Nurse Midwife

## 2018-09-17 ENCOUNTER — Other Ambulatory Visit: Payer: Self-pay

## 2018-09-17 ENCOUNTER — Other Ambulatory Visit (HOSPITAL_COMMUNITY)
Admission: RE | Admit: 2018-09-17 | Discharge: 2018-09-17 | Disposition: A | Discharge status: Home / Self Care | Payer: BLUE CROSS/BLUE SHIELD | Source: Ambulatory Visit | Attending: Obstetrics and Gynecology | Admitting: Obstetrics and Gynecology

## 2018-09-17 VITALS — BP 128/74 | Ht 60.0 in | Wt 248.0 lb

## 2018-09-17 DIAGNOSIS — N898 Other specified noninflammatory disorders of vagina: Secondary | ICD-10-CM | POA: Diagnosis not present

## 2018-09-17 DIAGNOSIS — R1032 Left lower quadrant pain: Secondary | ICD-10-CM | POA: Insufficient documentation

## 2018-09-17 DIAGNOSIS — R1013 Epigastric pain: Secondary | ICD-10-CM | POA: Diagnosis not present

## 2018-09-17 DIAGNOSIS — Z113 Encounter for screening for infections with a predominantly sexual mode of transmission: Secondary | ICD-10-CM | POA: Diagnosis not present

## 2018-09-17 DIAGNOSIS — R1031 Right lower quadrant pain: Secondary | ICD-10-CM

## 2018-09-17 DIAGNOSIS — N926 Irregular menstruation, unspecified: Secondary | ICD-10-CM | POA: Diagnosis not present

## 2018-09-17 DIAGNOSIS — Z22322 Carrier or suspected carrier of Methicillin resistant Staphylococcus aureus: Secondary | ICD-10-CM | POA: Insufficient documentation

## 2018-09-17 LAB — POCT WET PREP (WET MOUNT): TRICHOMONAS WET PREP HPF POC: ABSENT

## 2018-09-17 NOTE — Progress Notes (Signed)
Obstetrics & Gynecology Office Visit   Chief Complaint:  Chief Complaint  Patient presents with  . Exposure to STD  and upper abdominal pain.  History of Present Illness: 28 year old BF presents as an acute work in appointment for two concerns: 1. Lower abdominal cramping that radiates into her pelvis x 5 days. Currently using withdrawal for contraception. Worried about an STD as she has heard rumors regarding boyfriend's infidelity. Has noticed a white vaginal discharge, but no odor or vulvovaginal itching or irritation. Was treated for BV in the ER 08/24/2018.  LMP 08/25/2018 was early, normal flow, but only lasted 4 days. Menses usually every 28-30 days, lasting 5 days. Can have some cramping before her menses starts, but recent cramping is more intense and radiates into pelvis. Has not taken anything for the pain.  2. Has had colicky epigastric pain for 5 days. Certain foods worsen pain (chips, milk). Denies nausea/ vomiting/ diarrhea/ blood in stool/ heartburn/ reflux. Last BM today and was normal. Has not taken anything for the pain. Denies any previous abdominal surgery   Review of Systems:  Review of Systems  Constitutional: Negative for chills, fever and weight loss.  HENT: Negative for congestion, sinus pain and sore throat.   Eyes: Negative for blurred vision and pain.  Respiratory: Negative for hemoptysis, shortness of breath and wheezing.   Cardiovascular: Negative for chest pain, palpitations and leg swelling.  Gastrointestinal: Positive for abdominal pain. Negative for blood in stool, constipation, diarrhea, heartburn, nausea and vomiting.  Genitourinary: Negative for dysuria, frequency, hematuria and urgency.       Positive for vaginal discharge  Musculoskeletal: Negative for back pain, joint pain and myalgias.  Skin: Negative for itching and rash.  Neurological: Negative for dizziness, tingling and headaches.  Endo/Heme/Allergies: Negative for environmental allergies  and polydipsia. Does not bruise/bleed easily.       Negative for hirsutism   Psychiatric/Behavioral: Negative for depression. The patient is not nervous/anxious and does not have insomnia.      Past Medical History:  Past Medical History:  Diagnosis Date  . MRSA (methicillin resistant staph aureus) culture positive     Past Surgical History:  Past Surgical History:  Procedure Laterality Date  . DILATION AND CURETTAGE OF UTERUS    . INCISION AND DRAINAGE      Gynecologic History: Patient's last menstrual period was 08/24/2018.  Obstetric History: [id number]  Family History:  Family History  Problem Relation Age of Onset  . Breast cancer Mother 43  . Diabetes Mother   . Heart attack Father 3  . Parkinson's disease Father     Social History:  Social History   Socioeconomic History  . Marital status: Single    Spouse name: Not on file  . Number of children: Not on file  . Years of education: Not on file  . Highest education level: Not on file  Occupational History  . Not on file  Social Needs  . Financial resource strain: Not on file  . Food insecurity:    Worry: Not on file    Inability: Not on file  . Transportation needs:    Medical: Not on file    Non-medical: Not on file  Tobacco Use  . Smoking status: Former Smoker    Last attempt to quit: 08/13/2016    Years since quitting: 2.0  . Smokeless tobacco: Never Used  Substance and Sexual Activity  . Alcohol use: No  . Drug use: No  . Sexual  activity: Yes    Birth control/protection: Condom    Comment: last depo shot 6 months ago  Lifestyle  . Physical activity:    Days per week: Not on file    Minutes per session: Not on file  . Stress: Not on file  Relationships  . Social connections:    Talks on phone: Not on file    Gets together: Not on file    Attends religious service: Not on file    Active member of club or organization: Not on file    Attends meetings of clubs or organizations: Not on file     Relationship status: Not on file  . Intimate partner violence:    Fear of current or ex partner: Not on file    Emotionally abused: Not on file    Physically abused: Not on file    Forced sexual activity: Not on file  Other Topics Concern  . Not on file  Social History Narrative  . Not on file    Allergies:  No Known Allergies  Medications: none Physical Exam Vitals: BP 128/74   Ht 5' (1.524 m)   Wt 248 lb (112.5 kg)   LMP 08/24/2018   BMI 48.43 kg/m    Patient's last menstrual period was 08/24/2018.  Physical Exam  Constitutional: She is oriented to person, place, and time. She appears well-developed and well-nourished. No distress.  Cardiovascular: Normal rate and regular rhythm.  No murmur heard. Respiratory: Effort normal and breath sounds normal.  GI: Soft. She exhibits no distension and no mass. There is abdominal tenderness (epigastric tenderness, negative Murphy's sign.). There is no rebound and no guarding.  Genitourinary:    Genitourinary Comments: Vulva: no inflammation or lesions Vagina: white mucoepithelial discharge Cervix: NT, closed, mobile Uterus: AV. NSSC, mobile, NT Adnexa: no masses, NT     Musculoskeletal: Normal range of motion.  Neurological: She is alert and oriented to person, place, and time.  Skin: Skin is warm and dry.  Psychiatric: She has a normal mood and affect.   Results for orders placed or performed in visit on 09/17/18 (from the past 24 hour(s))  POCT Wet Prep [facility] [place])     Status: Normal   Collection Time: 09/17/18  1:05 PM  Result Value Ref Range   Source Wet Prep POC vagina    WBC, Wet Prep HPF POC     Bacteria Wet Prep HPF POC     BACTERIA WET PREP MORPHOLOGY POC     Clue Cells Wet Prep HPF POC None None   Clue Cells Wet Prep Whiff POC     Yeast Wet Prep HPF POC None None   KOH Wet Prep POC     Trichomonas Wet Prep HPF POC Absent Absent   Assessment: 28 y.o. [id number] with epigastric pain and lower abdominal cramping  Differential for epigastric pain includes pancreatitis, PUD, gastritis  Will get baseline labs and will probably need referral to GI  Trial of Mylanta/ Mylox/ Gaviscon Differential for lower abdominal cramping includes dysmenorrhea (now day 5 of menstrual cycle), STD, ectopic, early pregnancy  No urinary symptoms. Beta HCG today Screening for STD  Plan: Problem List Items Addressed This Visit    None    Visit Diagnoses    Acute epigastric pain    -  Primary   Relevant Orders   CMP14+EGFR   Lipase   CBC w/Diff/Platelet   Abdominal cramping, bilateral lower quadrant       Relevant Orders  CBC w/Diff/Platelet   Cervicovaginal ancillary only   Beta hCG quant (ref lab)   Screening for STD (sexually transmitted disease)       Relevant Orders   Cervicovaginal ancillary only   RPR Qual   HIV Antibody (routine testing w rflx)   Irregular menses       Relevant Orders   Beta hCG quant (ref lab)     Will call with results.   Dalia Heading, CNM

## 2018-09-18 ENCOUNTER — Encounter: Payer: Self-pay | Admitting: Certified Nurse Midwife

## 2018-09-18 ENCOUNTER — Telehealth: Payer: Self-pay | Admitting: Certified Nurse Midwife

## 2018-09-18 DIAGNOSIS — R1013 Epigastric pain: Secondary | ICD-10-CM

## 2018-09-18 LAB — CMP14+EGFR
ALT: 18 IU/L (ref 0–32)
AST: 21 IU/L (ref 0–40)
Albumin/Globulin Ratio: 1.6 (ref 1.2–2.2)
Albumin: 4.3 g/dL (ref 3.9–5.0)
Alkaline Phosphatase: 70 IU/L (ref 39–117)
BUN / CREAT RATIO: 14 (ref 9–23)
BUN: 9 mg/dL (ref 6–20)
Bilirubin Total: 0.2 mg/dL (ref 0.0–1.2)
CO2: 20 mmol/L (ref 20–29)
CREATININE: 0.64 mg/dL (ref 0.57–1.00)
Calcium: 9.4 mg/dL (ref 8.7–10.2)
Chloride: 104 mmol/L (ref 96–106)
GFR calc Af Amer: 138 mL/min/{1.73_m2} (ref >59)
GFR calc non Af Amer: 120 mL/min/{1.73_m2} (ref >59)
GLUCOSE: 76 mg/dL (ref 65–99)
Globulin, Total: 2.7 g/dL (ref 1.5–4.5)
Potassium: 4 mmol/L (ref 3.5–5.2)
Sodium: 140 mmol/L (ref 134–144)
Total Protein: 7 g/dL (ref 6.0–8.5)

## 2018-09-18 LAB — CBC WITH DIFFERENTIAL/PLATELET
BASOS ABS: 0.1 10*3/uL (ref 0.0–0.2)
Basos: 1 %
EOS (ABSOLUTE): 0.1 10*3/uL (ref 0.0–0.4)
Eos: 1 %
Hematocrit: 38.7 % (ref 34.0–46.6)
Hemoglobin: 13.4 g/dL (ref 11.1–15.9)
IMMATURE GRANS (ABS): 0 10*3/uL (ref 0.0–0.1)
IMMATURE GRANULOCYTES: 0 %
LYMPHS: 31 %
Lymphocytes Absolute: 2.8 10*3/uL (ref 0.7–3.1)
MCH: 31.9 pg (ref 26.6–33.0)
MCHC: 34.6 g/dL (ref 31.5–35.7)
MCV: 92 fL (ref 79–97)
MONOCYTES: 9 %
Monocytes Absolute: 0.8 10*3/uL (ref 0.1–0.9)
NEUTROS PCT: 58 %
Neutrophils Absolute: 5.2 10*3/uL (ref 1.4–7.0)
Platelets: 320 10*3/uL (ref 150–450)
RBC: 4.2 x10E6/uL (ref 3.77–5.28)
RDW: 12 % (ref 11.7–15.4)
WBC: 8.8 10*3/uL (ref 3.4–10.8)

## 2018-09-18 LAB — HIV ANTIBODY (ROUTINE TESTING W REFLEX): HIV SCREEN 4TH GENERATION: NONREACTIVE

## 2018-09-18 LAB — BETA HCG QUANT (REF LAB): hCG Quant: <1 m[IU]/mL

## 2018-09-18 LAB — CERVICOVAGINAL ANCILLARY ONLY
CHLAMYDIA, DNA PROBE: NEGATIVE
NEISSERIA GONORRHEA: NEGATIVE

## 2018-09-18 LAB — RPR QUALITATIVE: RPR: NONREACTIVE

## 2018-09-18 LAB — LIPASE: LIPASE: 15 U/L (ref 14–72)

## 2018-09-18 MED ORDER — ESOMEPRAZOLE MAGNESIUM 20 MG PO PACK: PACK | ORAL | 3 refills | Status: DC

## 2018-09-18 NOTE — Telephone Encounter (Signed)
Patient called and advised of normal blood work results. Recommend GI consult and starting a PPI like Nexium. Patient is amenable to this plan. Farrel Conners, CNM

## 2018-09-19 ENCOUNTER — Ambulatory Visit: Payer: BLUE CROSS/BLUE SHIELD | Admitting: Obstetrics and Gynecology

## 2018-10-04 ENCOUNTER — Other Ambulatory Visit: Payer: Self-pay

## 2018-10-04 ENCOUNTER — Ambulatory Visit
Admission: EM | Admit: 2018-10-04 | Discharge: 2018-10-04 | Disposition: A | Discharge status: Home / Self Care | Payer: BLUE CROSS/BLUE SHIELD | Attending: Family Medicine | Admitting: Family Medicine

## 2018-10-04 ENCOUNTER — Other Ambulatory Visit: Payer: Self-pay | Admitting: Certified Nurse Midwife

## 2018-10-04 DIAGNOSIS — R1013 Epigastric pain: Secondary | ICD-10-CM | POA: Diagnosis not present

## 2018-10-04 LAB — COMPREHENSIVE METABOLIC PANEL
ALT: 21 U/L (ref 0–44)
AST: 22 U/L (ref 15–41)
Albumin: 4.2 g/dL (ref 3.5–5.0)
Alkaline Phosphatase: 70 U/L (ref 38–126)
Anion gap: 9 (ref 5–15)
BUN: 10 mg/dL (ref 6–20)
CO2: 24 mmol/L (ref 22–32)
Calcium: 9.1 mg/dL (ref 8.9–10.3)
Chloride: 103 mmol/L (ref 98–111)
Creatinine, Ser: 0.66 mg/dL (ref 0.44–1.00)
GFR calc Af Amer: >60 mL/min (ref >60)
GFR calc non Af Amer: >60 mL/min (ref >60)
Glucose, Bld: 98 mg/dL (ref 70–99)
Potassium: 3.8 mmol/L (ref 3.5–5.1)
Sodium: 136 mmol/L (ref 135–145)
Total Bilirubin: <0.1 mg/dL — ABNORMAL LOW (ref 0.3–1.2)
Total Protein: 7.7 g/dL (ref 6.5–8.1)

## 2018-10-04 LAB — CBC WITH DIFFERENTIAL/PLATELET
Abs Immature Granulocytes: 0.02 10*3/uL (ref 0.00–0.07)
Basophils Absolute: 0.1 10*3/uL (ref 0.0–0.1)
Basophils Relative: 1 %
Eosinophils Absolute: 0.1 10*3/uL (ref 0.0–0.5)
Eosinophils Relative: 1 %
HCT: 42.9 % (ref 36.0–46.0)
Hemoglobin: 14.1 g/dL (ref 12.0–15.0)
Immature Granulocytes: 0 %
Lymphocytes Relative: 33 %
Lymphs Abs: 2.2 10*3/uL (ref 0.7–4.0)
MCH: 31.6 pg (ref 26.0–34.0)
MCHC: 32.9 g/dL (ref 30.0–36.0)
MCV: 96.2 fL (ref 80.0–100.0)
Monocytes Absolute: 0.6 10*3/uL (ref 0.1–1.0)
Monocytes Relative: 9 %
Neutro Abs: 3.6 10*3/uL (ref 1.7–7.7)
Neutrophils Relative %: 56 %
Platelets: 306 10*3/uL (ref 150–400)
RBC: 4.46 MIL/uL (ref 3.87–5.11)
RDW: 12.7 % (ref 11.5–15.5)
WBC: 6.5 10*3/uL (ref 4.0–10.5)
nRBC: 0 % (ref 0.0–0.2)

## 2018-10-04 LAB — PREGNANCY, URINE: Preg Test, Ur: NEGATIVE

## 2018-10-04 LAB — LIPASE, BLOOD: Lipase: 25 U/L (ref 11–51)

## 2018-10-04 MED ORDER — PANTOPRAZOLE SODIUM 20 MG PO TBEC: 40.0000 mg | DELAYED_RELEASE_TABLET | Freq: Every day | ORAL | 1 refills | Status: DC

## 2018-10-04 MED ORDER — ALUM & MAG HYDROXIDE-SIMETH 200-200-20 MG/5ML PO SUSP
30.0000 mL | Freq: Once | ORAL | Status: AC
  Administered 2018-10-04: 30 mL via ORAL

## 2018-10-04 MED ORDER — ONDANSETRON 8 MG PO TBDP
8.0000 mg | ORAL_TABLET | Freq: Once | ORAL | Status: AC
  Administered 2018-10-04: 8 mg via ORAL

## 2018-10-04 MED ORDER — FAMOTIDINE 20 MG PO TABS
20.0000 mg | ORAL_TABLET | Freq: Every day | ORAL | Status: DC
  Administered 2018-10-04: 20 mg via ORAL

## 2018-10-04 MED ORDER — PANTOPRAZOLE SODIUM 40 MG PO TBEC: 40.0000 mg | DELAYED_RELEASE_TABLET | Freq: Every day | ORAL | 3 refills | Status: DC

## 2018-10-04 MED ORDER — LIDOCAINE VISCOUS HCL 2 % MT SOLN
15.0000 mL | Freq: Once | OROMUCOSAL | Status: AC
  Administered 2018-10-04: 15 mL via ORAL

## 2018-10-04 NOTE — ED Triage Notes (Signed)
Pt here for abdominal pain in her epigastric area since this morning and states it feels as if her "stomach is on fire" feeling nauseous but no vomiting. No reports of diarrhea or constipation. States she is unable to eat. No otc meds tried.

## 2018-10-04 NOTE — Discharge Instructions (Signed)
Medication as prescribed.  If your pain persists or worsens, you need to go to the ER.  Take care  Dr. Adriana Simas

## 2018-10-04 NOTE — ED Provider Notes (Signed)
[facility]    CSN: [id number] Arrival date & time: 10/04/18  1755  History   Chief Complaint Chief Complaint  Patient presents with   Abdominal Pain   HPI   28 year old female presents with severe upper abdominal pain.  Patient recently had a bout of epigastric pain and was seen on 3/17 by OB/GYN.  Patient states that her pain resolved and she was doing well.  She states that today she developed severe epigastric pain.  Described as burning.  She states that it feels like it is on "fire".  Her pain is currently severe, 10/10.  She is crying in pain.  She states that the pain is located in the abdomen.  Associated nausea.  No vomiting.  No reports of diarrhea or constipation.  She states that it began earlier today when she tried to eat.  She has been unable to really eat anything all day.  No fever.  No other associated symptoms.  No known relieving factors.  No other complaints.  PMH, Surgical Hx, Family Hx, Social History reviewed and updated as below.  Past Medical History:  Diagnosis Date   Morbid obesity (HCC)    MRSA (methicillin resistant staph aureus) culture positive    Past Surgical History:  Procedure Laterality Date   DILATION AND CURETTAGE OF UTERUS     INCISION AND DRAINAGE     OB History    Gravida  4   Para  3   Term      Preterm      AB  1   Living  3     SAB  1   TAB      Ectopic      Multiple      Live Births  3          Home Medications    Prior to Admission medications   Medication Sig Start Date End Date Taking? Authorizing Provider  pantoprazole (PROTONIX) 20 MG tablet Take 2 tablets (40 mg total) by mouth daily. 10/04/18   Tommie Sams, DO    Family History Family History  Problem Relation Age of Onset   Breast cancer Mother 59   Diabetes Mother    Heart attack Father 47   Parkinson's disease Father     Social History Social History   Tobacco Use   Smoking status: Former Smoker    Last attempt  to quit: 08/13/2016    Years since quitting: 2.1   Smokeless tobacco: Never Used  Substance Use Topics   Alcohol use: No   Drug use: No     Allergies   Patient has no known allergies.   Review of Systems Review of Systems  Constitutional: Negative for fever.  Gastrointestinal: Positive for abdominal pain and nausea.   Physical Exam Triage Vital Signs ED Triage Vitals  Enc Vitals Group     BP 10/04/18 1801 127/87     Pulse Rate 10/04/18 1800 87     Resp 10/04/18 1800 18     Temp 10/04/18 1800 98.2 F (36.8 C)     Temp Source 10/04/18 1800 Oral     SpO2 10/04/18 1800 99 %     Weight 10/04/18 1802 240 lb (108.9 kg)     Height 10/04/18 1802 5\' 1"  (1.549 m)     Head Circumference --      Peak Flow --      Pain Score 10/04/18 1802 10     Pain Loc --  Pain Edu? --      Excl. in GC? --    Updated Vital Signs BP 127/87    Pulse 87    Temp 98.2 F (36.8 C) (Oral)    Resp 18    Ht 5\' 1"  (1.549 m)    Wt 108.9 kg    LMP 09/01/2018 (Exact Date)    SpO2 99%    BMI 45.35 kg/m   Visual Acuity Right Eye Distance:   Left Eye Distance:   Bilateral Distance:    Right Eye Near:   Left Eye Near:    Bilateral Near:     Physical Exam Vitals signs and nursing note reviewed.  Constitutional:      Appearance: She is obese.     Comments: Appears in distress due to pain.  HENT:     Head: Normocephalic and atraumatic.  Eyes:     General: No scleral icterus.    Conjunctiva/sclera: Conjunctivae normal.  Cardiovascular:     Rate and Rhythm: Normal rate and regular rhythm.  Pulmonary:     Effort: Pulmonary effort is normal.     Breath sounds: Normal breath sounds.  Abdominal:     Comments: Obese. Soft.  Exquisitely tender to palpation in the epigastric region.  Neurological:     Mental Status: She is alert.  Psychiatric:     Comments: Crying. Flat affect.    UC Treatments / Results  Labs (all labs ordered are listed, but only abnormal results are displayed) Labs  Reviewed  COMPREHENSIVE METABOLIC PANEL - Abnormal; Notable for the following components:      Result Value   Total Bilirubin <0.1 (*)    All other components within normal limits  PREGNANCY, URINE  CBC WITH DIFFERENTIAL/PLATELET  LIPASE, BLOOD    EKG None  Radiology No results found.  Procedures Procedures (including critical care time)  Medications Ordered in UC Medications  famotidine (PEPCID) tablet 20 mg (20 mg Oral Given 10/04/18 1817)  alum & mag hydroxide-simeth (MAALOX/MYLANTA) 200-200-20 MG/5ML suspension 30 mL (30 mLs Oral Given During Downtime 10/04/18 1822)    And  lidocaine (XYLOCAINE) 2 % viscous mouth solution 15 mL (15 mLs Oral Given During Downtime 10/04/18 1823)  ondansetron (ZOFRAN-ODT) disintegrating tablet 8 mg (8 mg Oral Given 10/04/18 1822)    Initial Impression / Assessment and Plan / UC Course  I have reviewed the triage vital signs and the nursing notes.  Pertinent labs & imaging results that were available during my care of the patient were reviewed by me and considered in my medical decision making (see chart for details).    28 year old female presents with epigastric pain.  GERD versus gastritis.  Normal labs.  She had a slight improvement in her pain with GI cocktail and Pepcid.  Placing on Protonix.  Advised to go to the ER if she fails to improve or worsens.  Final Clinical Impressions(s) / UC Diagnoses   Final diagnoses:  Epigastric pain     Discharge Instructions     Medication as prescribed.  If your pain persists or worsens, you need to go to the ER.  Take care  Dr. Adriana Simas     ED Prescriptions    Medication Sig Dispense Auth. Provider   pantoprazole (PROTONIX) 20 MG tablet Take 2 tablets (40 mg total) by mouth daily. 30 tablet Tommie Sams, DO     Controlled Substance Prescriptions [place] Controlled Substance Registry consulted? Not Applicable   Tommie Sams, DO 10/04/18 1859

## 2018-10-12 ENCOUNTER — Encounter: Payer: Self-pay | Admitting: Emergency Medicine

## 2018-10-12 ENCOUNTER — Other Ambulatory Visit: Payer: Self-pay

## 2018-10-12 ENCOUNTER — Ambulatory Visit
Admission: EM | Admit: 2018-10-12 | Discharge: 2018-10-12 | Disposition: A | Discharge status: Home / Self Care | Payer: BLUE CROSS/BLUE SHIELD | Attending: Family Medicine | Admitting: Family Medicine

## 2018-10-12 DIAGNOSIS — K047 Periapical abscess without sinus: Secondary | ICD-10-CM

## 2018-10-12 DIAGNOSIS — K029 Dental caries, unspecified: Secondary | ICD-10-CM | POA: Diagnosis not present

## 2018-10-12 DIAGNOSIS — K0889 Other specified disorders of teeth and supporting structures: Secondary | ICD-10-CM

## 2018-10-12 MED ORDER — FLUCONAZOLE 150 MG PO TABS: 150.0000 mg | ORAL_TABLET | Freq: Once | ORAL | 0 refills | Status: AC

## 2018-10-12 MED ORDER — AMOXICILLIN-POT CLAVULANATE 875-125 MG PO TABS: 1.0000 | ORAL_TABLET | Freq: Two times a day (BID) | ORAL | 0 refills | Status: DC

## 2018-10-12 NOTE — Discharge Instructions (Signed)
Take medication as prescribed. Rest. Drink plenty of fluids. Follow up with dentist as soon as possible. ° °Follow up with your primary care physician this week as needed. Return to Urgent care for new or worsening concerns.  ° °

## 2018-10-12 NOTE — ED Triage Notes (Signed)
Patient c/o toothpain on the left lower for the past 2 days.

## 2018-10-12 NOTE — ED Provider Notes (Signed)
[facility] ____________________________________________  Time seen: Approximately 5:05 PM  I have reviewed the triage vital signs and the nursing notes.   HISTORY  Chief Complaint Dental Pain   HPI Alexis Berg is a 28 y.o. female presenting for evaluation of right lower dental pain present for the last 2 days.  States pain is currently mild.  Aching intermittently throbbing pain.  Denies fevers.  Continues eat and drink well.  States she has had a broken tooth to that area for a long time.  States she has had a previous infection to that area that started similarly, and wanted to make sure it did not get worse.  Denies injury.  Denies fevers, cough, congestion, sore throat, chest pain or shortness of breath.  Reports otherwise doing well.  No alleviating measures attempted prior to arrival.  Denies other aggravating factors.  Patient's last menstrual period was 09/18/2018 (approximate).  Denies pregnancy.   Past Medical History:  Diagnosis Date  . Morbid obesity (HCC)   . MRSA (methicillin resistant staph aureus) culture positive     Patient Active Problem List   Diagnosis Date Noted  . Morbid obesity (HCC)   . MRSA (methicillin resistant staph aureus) culture positive     Past Surgical History:  Procedure Laterality Date  . DILATION AND CURETTAGE OF UTERUS    . INCISION AND DRAINAGE      Current Outpatient Rx  . Order #: [id number] Class: Normal  . Order #: [id number] Class: Normal  . Order #: [id number] Class: Normal    Allergies Patient has no known allergies.  Family History  Problem Relation Age of Onset  . Breast cancer Mother 76  . Diabetes Mother   . Heart attack Father 66  . Parkinson's disease Father     Social History Social History   Tobacco Use  . Smoking status: Former Smoker    Last attempt to quit: 08/13/2016    Years since quitting: 2.1  . Smokeless tobacco: Never Used  Substance Use Topics  . Alcohol use: No  . Drug  use: No    Review of Systems Constitutional: No fever/chills. Reports continues to eat and drink foods and fluids well.  ENT: No sore throat. Cardiovascular: Denies chest pain. Respiratory: Denies shortness of breath. Gastrointestinal: No abdominal pain.  No nausea, no vomiting.  Genitourinary: Negative for dysuria. Musculoskeletal: Negative for back pain. Skin: Negative for rash.  ____________________________________________   PHYSICAL EXAM:  VITAL SIGNS: ED Triage Vitals  Enc Vitals Group     BP 10/12/18 1609 113/74     Pulse Rate 10/12/18 1609 95     Resp 10/12/18 1609 16     Temp 10/12/18 1609 98.9 F (37.2 C)     Temp Source 10/12/18 1609 Oral     SpO2 10/12/18 1609 99 %     Weight 10/12/18 1606 240 lb (108.9 kg)     Height 10/12/18 1606 5' (1.524 m)     Head Circumference --      Peak Flow --      Pain Score 10/12/18 1606 8     Pain Loc --      Pain Edu? --      Excl. in GC? --     Constitutional: Alert and oriented. Well appearing and in no acute distress. Head: Atraumatic. Nose: No congestion Mouth/Throat: Mucous membranes are moist.  Oropharynx non-erythematous. Periodontal Exam   Multiple dental caries and fractures throughout.  Right lower gumline tenderness beneath tooth #30 and 31 with fractured  tooth same.  No visible or palpated abscess.  No facial swelling noted.  Neck: No stridor.  Hematological/Lymphatic/Immunilogical: No cervical lymphadenopathy. Cardiovascular:   Normal rate, regular rhythm. Grossly normal heart sounds. Good peripheral circulation. Respiratory: Normal respiratory effort.  No retractions. Musculoskeletal:Steady gait. Neurologic:  Normal speech and language. Speech is normal. No gait instability. Skin:  Skin is warm, dry. Psychiatric: Mood and affect are normal. Speech and behavior are normal.  ____________________________________________   LABS (all labs ordered are listed, but only abnormal results are displayed)  Labs  Reviewed - No data to display ____________________________________________    INITIAL IMPRESSION / ASSESSMENT AND PLAN / ED COURSE  Pertinent labs & imaging results that were available during my care of the patient were reviewed by me and considered in my medical decision making (see chart for details).  Well-appearing patient.  Patient with right lower dental pain and tenderness with fractured teeth and dental caries.  Suspect secondary dental infection.  Will treat with oral Augmentin.  Request Diflucan, 1 Diflucan given.  Over-the-counter Tylenol ibuprofen as needed.  Mouth rinsing, supportive care.  Follow-up with dentist as soon as possible.  Discussed follow-up and return parameters.  Patient verbalized understanding and agreed to plan. ____________________________________________   FINAL CLINICAL IMPRESSION(S) / ED DIAGNOSES  Final diagnoses:  Dental infection  Pain, dental         Renford DillsMiller, Kamani Magnussen, NP 10/12/18 1710

## 2018-11-07 ENCOUNTER — Encounter: Payer: BLUE CROSS/BLUE SHIELD | Admitting: Advanced Practice Midwife

## 2018-11-08 ENCOUNTER — Encounter: Payer: Self-pay | Admitting: Certified Nurse Midwife

## 2018-11-08 ENCOUNTER — Other Ambulatory Visit (INDEPENDENT_AMBULATORY_CARE_PROVIDER_SITE_OTHER): Payer: BLUE CROSS/BLUE SHIELD

## 2018-11-08 ENCOUNTER — Ambulatory Visit (INDEPENDENT_AMBULATORY_CARE_PROVIDER_SITE_OTHER): Payer: BLUE CROSS/BLUE SHIELD | Admitting: Certified Nurse Midwife

## 2018-11-08 ENCOUNTER — Other Ambulatory Visit: Payer: Self-pay

## 2018-11-08 ENCOUNTER — Other Ambulatory Visit: Payer: Self-pay | Admitting: Certified Nurse Midwife

## 2018-11-08 VITALS — BP 124/78 | Ht 60.0 in | Wt 251.0 lb

## 2018-11-08 DIAGNOSIS — Z3491 Encounter for supervision of normal pregnancy, unspecified, first trimester: Secondary | ICD-10-CM

## 2018-11-08 DIAGNOSIS — N912 Amenorrhea, unspecified: Secondary | ICD-10-CM

## 2018-11-08 DIAGNOSIS — N9489 Other specified conditions associated with female genital organs and menstrual cycle: Secondary | ICD-10-CM | POA: Diagnosis not present

## 2018-11-08 DIAGNOSIS — R102 Pelvic and perineal pain: Secondary | ICD-10-CM

## 2018-11-08 DIAGNOSIS — Z3201 Encounter for pregnancy test, result positive: Secondary | ICD-10-CM

## 2018-11-08 DIAGNOSIS — O208 Other hemorrhage in early pregnancy: Secondary | ICD-10-CM

## 2018-11-08 DIAGNOSIS — R1032 Left lower quadrant pain: Secondary | ICD-10-CM | POA: Diagnosis not present

## 2018-11-08 DIAGNOSIS — Z3A01 Less than 8 weeks gestation of pregnancy: Secondary | ICD-10-CM

## 2018-11-08 LAB — POCT URINE PREGNANCY: Preg Test, Ur: POSITIVE — AB

## 2018-11-09 ENCOUNTER — Encounter: Payer: Self-pay | Admitting: Certified Nurse Midwife

## 2018-11-09 DIAGNOSIS — F419 Anxiety disorder, unspecified: Secondary | ICD-10-CM | POA: Insufficient documentation

## 2018-11-09 DIAGNOSIS — R12 Heartburn: Secondary | ICD-10-CM | POA: Insufficient documentation

## 2018-11-09 NOTE — Progress Notes (Signed)
Obstetrics & Gynecology Office Visit   Chief Complaint:  Chief Complaint  Patient presents with  . Abdominal Pain    U/S, LLQ pain for about 3 days, lower back pain as well, no spotting/bleeding    History of Present Illness: 28 year old BF G5 P3013 presents as an acute work in appointment for LLQ pain x 3days. Her LMP was the end of March. Prior LMP 08/25/2018. Has been using withdrawal for contraception. Had a positive pregnancy test 20 April.  Menses usually every 28-30 days, lasting 5 days. She denies vaginal bleeding or spotting. Denies fever, vomiting, diarrhea, constipation, or dysuria. ROS is positive for nausea. The pain in her LLQ was alleviated with 200 mgm ibuprofen and lying on her left side. She was scheduled for a telephone NOB on 5/11. Past obstetrical history   Review of Systems: Review of Systems  Constitutional: Negative for chills, fever and weight loss.  HENT: Negative for congestion, sinus pain and sore throat.   Eyes: Negative for blurred vision and pain.  Respiratory: Negative for hemoptysis, shortness of breath and wheezing.   Cardiovascular: Positive for palpitations (with anxiety). Negative for chest pain and leg swelling.  Gastrointestinal: Positive for abdominal pain (LLQ x 3 days). Negative for blood in stool, diarrhea, heartburn, nausea and vomiting.  Genitourinary: Negative for dysuria, frequency, hematuria and urgency.  Musculoskeletal: Negative for back pain, joint pain and myalgias.  Skin: Positive for itching and rash.  Neurological: Negative for dizziness, tingling and headaches.  Endo/Heme/Allergies: Negative for environmental allergies and polydipsia. Does not bruise/bleed easily.       Negative for hirsutism   Psychiatric/Behavioral: Negative for depression. The patient is nervous/anxious. The patient does not have insomnia.      Past Medical History:  Past Medical History:  Diagnosis Date  . Anxiety   . Heartburn   . Morbid obesity (HCC)    . MRSA (methicillin resistant staph aureus) culture positive    2013/2014-on right leg/ thigh  . Pruritus     Past Surgical History:  Past Surgical History:  Procedure Laterality Date  . DILATION AND CURETTAGE OF UTERUS    . INCISION AND DRAINAGE      Gynecologic History: Patient's last menstrual period was 09/18/2018 (approximate).  Obstetric History: [id number]  Family History:  Family History  Problem Relation Age of Onset  . Breast cancer Mother [id number]  . Diabetes Mother   . Thyroid disease Mother   . Heart attack Father [id number]  . Parkinson's disease Father   . Sickle cell anemia Nephew        brother's son    Social History:  Social History   Socioeconomic History  . Marital status: Single    Spouse name: Not on file  . Number of children: 3  . Years of education: Not on file  . Highest education level: Not on file  Occupational History  . Not on file  Social Needs  . Financial resource strain: Not on file  . Food insecurity:    Worry: Not on file    Inability: Not on file  . Transportation needs:    Medical: Not on file    Non-medical: Not on file  Tobacco Use  . Smoking status: Former Smoker    Last attempt to quit: 08/13/2016    Years since quitting: 2.2  . Smokeless tobacco: Never Used  Substance and Sexual Activity  . Alcohol use: Not Currently    Comment: last alcohol 20 April-2 beer  .  Drug use: No  . Sexual activity: Yes    Partners: Male    Birth control/protection: Coitus interruptus  Lifestyle  . Physical activity:    Days per week: Not on file    Minutes per session: Not on file  . Stress: Not on file  Relationships  . Social connections:    Talks on phone: Not on file    Gets together: Not on file    Attends religious service: Not on file    Active member of club or organization: Not on file    Attends meetings of clubs or organizations: Not on file    Relationship status: Not on file  . Intimate partner violence:    Fear of current or  ex partner: Not on file    Emotionally abused: Not on file    Physically abused: Not on file    Forced sexual activity: Not on file  Other Topics Concern  . Not on file  Social History Narrative  . Not on file    Allergies:  No Known Allergies  Medications: Current Outpatient Medications on File Prior to Visit  Medication Sig Dispense Refill  . hydrOXYzine (VISTARIL) 25 MG capsule Take 25 mg by mouth every 6 (six) hours as needed for itching.    . pantoprazole (PROTONIX) 20 MG tablet Take 40 mg by mouth daily as needed for heartburn.    . Prenatal Vit-Fe Fumarate-FA (MULTIVITAMIN-PRENATAL) 27-0.8 MG TABS tablet Take 1 tablet by mouth daily at 12 noon.     No current facility-administered medications on file prior to visit.    Physical Exam Vitals: BP 124/78   Ht 5' (1.524 m)   Wt 251 lb (113.9 kg)   LMP 09/18/2018 (Approximate)   BMI 49.02 kg/m  Patient's last menstrual period was 09/18/2018 (approximate).  Physical Exam  Constitutional: She is oriented to person, place, and time.  Obese BF in NAD  HENT:  Mouth/Throat: Oropharynx is clear and moist.  Eyes: Pupils are equal, round, and reactive to light. No scleral icterus.  Neck: No thyromegaly present.  Cardiovascular: Normal rate and regular rhythm.  No murmur heard. Respiratory: Effort normal.  GI: Soft. She exhibits no distension and no mass. There is abdominal tenderness (LLQ). There is rebound (LLQ). There is no guarding.  Genitourinary:    Genitourinary Comments: Deferred due to ultrasound today   Lymphadenopathy:    She has no cervical adenopathy.  Neurological: She is alert and oriented to person, place, and time.  Skin: Skin is warm and dry.  Psychiatric: She has a normal mood and affect.   Ultrasound today:  Singleton intrauterine pregnancy is visualized with a CRL consistent with 109w2d gestation, giving an (U/S) EDD of 07/02/19.  FHR: 121 BPM CRL measurement: 5.7 mm Yolk sac is visualized and appears  normal .  Gestational sac appears very small compared to embryo size. Gestational sac measuring [redacted]w[redacted]d. Amnion: not visualized  Small SCH measuring 1.2 x 1.7 x 0.4cm.  Right Ovary is normal in appearance. Left Ovary is normal appearance. Area superior to left ovary measuring 1.8 x 1.5 x 1.8cm. Questionable corpus luteal vs  adnexal mass. Difficult to distinguish due to body habitus.   Survey of the adnexa demonstrates no adnexal masses. There is no free peritoneal fluid in the cul de sac.  Impression: 1. [redacted]w[redacted]d Viable Singleton Intrauterine pregnancy by U/S with EDD of 07/02/19. Gestational sac measuring only [redacted]w[redacted]d. 2. Small subchorionic hemorrhage.  3. Complex area superior to left ovary. Questionable corpus  luteal vs adnexal mass.   Assessment: 28 y.o. [id number] with [facility] at [facility] with an EDC=06/22/19 LLQ pain 1.8cm x 1.5 x 1.8 cm mass in left adnexa  Plan: Tylenol for pain. Can take atarax or pantoprazole as needed.  Continue prenatal vitamins Repeat ultrasound in 2 weeks when she returns for  NOB appointment and NOB labs Reviewed diet in pregnancy, foods to avoid, fish advisories, preventing Listeria infections, limiting caffeine, etc (hand outs given on diet, safe medications, substance use in pregnancy, prenatal care at [facility]) Discussed staff at [facility] that will be providing prenatal care. Discussed notifying [facility] or going to ER with severe LLQ pain.   Farrel Conners, CNM

## 2018-11-11 ENCOUNTER — Encounter: Payer: BLUE CROSS/BLUE SHIELD | Admitting: Certified Nurse Midwife

## 2018-11-14 ENCOUNTER — Other Ambulatory Visit: Payer: Self-pay | Admitting: Certified Nurse Midwife

## 2018-11-14 DIAGNOSIS — N9489 Other specified conditions associated with female genital organs and menstrual cycle: Secondary | ICD-10-CM

## 2018-11-14 DIAGNOSIS — O3680X1 Pregnancy with inconclusive fetal viability, fetus 1: Secondary | ICD-10-CM

## 2018-11-14 DIAGNOSIS — R1032 Left lower quadrant pain: Secondary | ICD-10-CM

## 2018-11-22 ENCOUNTER — Encounter: Payer: Self-pay | Admitting: Emergency Medicine

## 2018-11-22 ENCOUNTER — Other Ambulatory Visit: Payer: Self-pay

## 2018-11-22 ENCOUNTER — Emergency Department: Payer: BLUE CROSS/BLUE SHIELD

## 2018-11-22 ENCOUNTER — Ambulatory Visit (INDEPENDENT_AMBULATORY_CARE_PROVIDER_SITE_OTHER)
Admission: EM | Admit: 2018-11-22 | Discharge: 2018-11-22 | Disposition: A | Discharge status: Other Acute Inpatient Hospital | Payer: BLUE CROSS/BLUE SHIELD | Source: Home / Self Care | Attending: Family Medicine | Admitting: Family Medicine

## 2018-11-22 ENCOUNTER — Emergency Department
Admission: EM | Admit: 2018-11-22 | Discharge: 2018-11-22 | Discharge status: Against Medical Advice | Payer: BLUE CROSS/BLUE SHIELD | Attending: Student in an Organized Health Care Education/Training Program | Admitting: Student in an Organized Health Care Education/Training Program

## 2018-11-22 DIAGNOSIS — O021 Missed abortion: Secondary | ICD-10-CM

## 2018-11-22 DIAGNOSIS — Z79899 Other long term (current) drug therapy: Secondary | ICD-10-CM | POA: Insufficient documentation

## 2018-11-22 DIAGNOSIS — R1032 Left lower quadrant pain: Secondary | ICD-10-CM

## 2018-11-22 DIAGNOSIS — O208 Other hemorrhage in early pregnancy: Secondary | ICD-10-CM | POA: Diagnosis not present

## 2018-11-22 DIAGNOSIS — O4690 Antepartum hemorrhage, unspecified, unspecified trimester: Secondary | ICD-10-CM | POA: Diagnosis not present

## 2018-11-22 DIAGNOSIS — Z3A08 8 weeks gestation of pregnancy: Secondary | ICD-10-CM | POA: Diagnosis not present

## 2018-11-22 DIAGNOSIS — O209 Hemorrhage in early pregnancy, unspecified: Secondary | ICD-10-CM | POA: Diagnosis not present

## 2018-11-22 DIAGNOSIS — Z3A01 Less than 8 weeks gestation of pregnancy: Secondary | ICD-10-CM | POA: Diagnosis not present

## 2018-11-22 LAB — BASIC METABOLIC PANEL
Anion gap: 9 (ref 5–15)
BUN: 9 mg/dL (ref 6–20)
CO2: 25 mmol/L (ref 22–32)
Calcium: 9.4 mg/dL (ref 8.9–10.3)
Chloride: 105 mmol/L (ref 98–111)
Creatinine, Ser: 0.7 mg/dL (ref 0.44–1.00)
GFR calc Af Amer: >60 mL/min (ref >60)
GFR calc non Af Amer: >60 mL/min (ref >60)
Glucose, Bld: 110 mg/dL — ABNORMAL HIGH (ref 70–99)
Potassium: 4 mmol/L (ref 3.5–5.1)
Sodium: 139 mmol/L (ref 135–145)

## 2018-11-22 LAB — HCG, QUANTITATIVE, PREGNANCY: hCG, Beta Chain, Quant, S: 4349 m[IU]/mL — ABNORMAL HIGH (ref <5)

## 2018-11-22 LAB — POCT PREGNANCY, URINE: Preg Test, Ur: POSITIVE — AB

## 2018-11-22 LAB — ABO/RH: ABO/RH(D): O NEG

## 2018-11-22 LAB — CBC
HCT: 40.2 % (ref 36.0–46.0)
Hemoglobin: 13.2 g/dL (ref 12.0–15.0)
MCH: 31.7 pg (ref 26.0–34.0)
MCHC: 32.8 g/dL (ref 30.0–36.0)
MCV: 96.6 fL (ref 80.0–100.0)
Platelets: 350 10*3/uL (ref 150–400)
RBC: 4.16 MIL/uL (ref 3.87–5.11)
RDW: 12.9 % (ref 11.5–15.5)
WBC: 8 10*3/uL (ref 4.0–10.5)
nRBC: 0 % (ref 0.0–0.2)

## 2018-11-22 MED ORDER — RHO D IMMUNE GLOBULIN 1500 UNIT/2ML IJ SOSY
300.0000 ug | PREFILLED_SYRINGE | Freq: Once | INTRAMUSCULAR | Status: DC
  Filled 2018-11-22: qty 2

## 2018-11-22 NOTE — ED Notes (Signed)
Pt in room from ultrasound.

## 2018-11-22 NOTE — ED Notes (Signed)
Pt states she is not able to wait  Explained the wait   Provider aware

## 2018-11-22 NOTE — Discharge Instructions (Signed)
Please follow up with [facility] as scheduled.  Return to the ER for symptoms that change or worsen if unable to see the gynecologist.

## 2018-11-22 NOTE — ED Triage Notes (Signed)
Patient c/o spotting yesterday. She states it resolved yesterday after she laid down but then when she woke up this morning it started back. Patient states she is 9 weeks today. She does report LLQ abdominal pain that started this morning.

## 2018-11-22 NOTE — ED Provider Notes (Signed)
[facility] Emergency Department Provider Note  ____________________________________________  Time seen: Approximately 5:01 PM  I have reviewed the triage vital signs and the nursing notes.   HISTORY  Chief Complaint Vaginal Bleeding    HPI Alexis Berg is a 28 y.o. female who presents to the emergency department for evaluation of vaginal bleeding.   Patient is estimated to be 8 to [redacted] weeks pregnant.  She is gravida 5 para 3.  Spontaneous abortion was approximately 1 year ago.  For the past 2 days she has been having some left lower quadrant pain with vaginal spotting and left lower quadrant cramping.  She denies fever or other complaints.  She reports that her blood type is O-.  She has an appointment with gynecology in about 5 days.  Past Medical History:  Diagnosis Date  . Anxiety   . Heartburn   . Morbid obesity (HCC)   . MRSA (methicillin resistant staph aureus) culture positive    2013/2014-on right leg/ thigh  . Pruritus     Patient Active Problem List   Diagnosis Date Noted  . Anxiety 11/09/2018  . Heartburn 11/09/2018  . Morbid obesity (HCC)   . MRSA (methicillin resistant staph aureus) culture positive     Past Surgical History:  Procedure Laterality Date  . DILATION AND CURETTAGE OF UTERUS    . INCISION AND DRAINAGE      Prior to Admission medications   Medication Sig Start Date End Date Taking? Authorizing Provider  hydrOXYzine (VISTARIL) 25 MG capsule Take 25 mg by mouth every 6 (six) hours as needed for itching.    [provider]  pantoprazole (PROTONIX) 20 MG tablet Take 40 mg by mouth daily as needed for heartburn.    [provider]  Prenatal Vit-Fe Fumarate-FA (MULTIVITAMIN-PRENATAL) 27-0.8 MG TABS tablet Take 1 tablet by mouth daily at 12 noon.    [provider]    Allergies Patient has no known allergies.  Family History  Problem Relation Age of Onset  . Breast cancer Mother 61  .  Diabetes Mother   . Thyroid disease Mother   . Heart attack Father 48  . Parkinson's disease Father   . Sickle cell anemia Nephew        brother's son    Social History Social History   Tobacco Use  . Smoking status: Former Smoker    Last attempt to quit: 08/13/2016    Years since quitting: 2.2  . Smokeless tobacco: Never Used  Substance Use Topics  . Alcohol use: Not Currently    Comment: last alcohol 20 April-2 beer  . Drug use: No    Review of Systems Constitutional: Negative for fever. Respiratory: Negative for shortness of breath or cough. Gastrointestinal: Positive for abdominal pain; negative for nausea , negative for vomiting. Genitourinary: Negative for dysuria , negative for vaginal discharge.  Positive for vaginal bleeding Musculoskeletal: Negative for back pain. Skin: Negative for acute skin changes/rash/lesion. ____________________________________________   PHYSICAL EXAM:  VITAL SIGNS: ED Triage Vitals  Enc Vitals Group     BP 11/22/18 1427 119/75     Pulse Rate 11/22/18 1427 (!) 107     Resp 11/22/18 1427 16     Temp 11/22/18 1427 98.9 F (37.2 C)     Temp Source 11/22/18 1427 Oral     SpO2 11/22/18 1427 97 %     Weight 11/22/18 1426 243 lb (110.2 kg)     Height 11/22/18 1426 5' (1.524 m)  Head Circumference --      Peak Flow --      Pain Score 11/22/18 1425 5     Pain Loc --      Pain Edu? --      Excl. in GC? --     Constitutional: Alert and oriented. Well appearing and in no acute distress. Eyes: Conjunctivae are normal. Head: Atraumatic. Nose: No congestion/rhinnorhea. Mouth/Throat: Mucous membranes are moist. Respiratory: Normal respiratory effort.  No retractions. Gastrointestinal: Bowel sounds active x 4; Abdomen is soft without rebound or guarding. Genitourinary: Pelvic exam: Not indicated Musculoskeletal: No extremity tenderness nor edema.  Neurologic:  Normal speech and language. No gross focal neurologic deficits are  appreciated. Speech is normal. No gait instability. Skin:  Skin is warm, dry and intact. No rash noted on exposed skin. Psychiatric: Mood and affect are normal. Speech and behavior are normal.  ____________________________________________   LABS (all labs ordered are listed, but only abnormal results are displayed)  Labs Reviewed  HCG, QUANTITATIVE, PREGNANCY - Abnormal; Notable for the following components:      Result Value   hCG, Beta Chain, Quant, S 4,349 (*)    All other components within normal limits  BASIC METABOLIC PANEL - Abnormal; Notable for the following components:   Glucose, Bld 110 (*)    All other components within normal limits  POCT PREGNANCY, URINE - Abnormal; Notable for the following components:   Preg Test, Ur POSITIVE (*)    All other components within normal limits  CBC  POC URINE PREG, ED  ABO/RH   ____________________________________________  RADIOLOGY  Ultrasound consistent with failed pregnancy.  No cardiac activity was identified.  No adnexal mass identified. ____________________________________________  Procedures  ____________________________________________  28 year old female presents to the emergency department for left lower pelvic pain and vaginal bleeding who estimates that she is 8 to [redacted] weeks pregnant.  Beta hCG seems low if she is in fact 8 to [redacted] weeks gestation.  Patient is O- which was verified again today.  RhoGam will be ordered and administered despite ultrasound findings showing failed pregnancy.  Was made aware that the pregnancy will not likely be successful.  She states that she had a miscarriage 1 year ago.  She did not require hospitalization and denies complications.  She states that [facility] this coming [facility].  She was strongly encouraged to keep that appointment as scheduled.  She is to return to the emergency department if she begins to have excessively heavy bleeding/clotting that requires  changing pad every hour or sooner.  She is to return if the pain in the left side increases.  Patient verbalized understanding of these instructions.  Patient left AMA before administration of RhoGam. She was advised of the risk including termination of an otherwise viable pregnancy if for example, she was too early in pregnancy to detect the cardiac movement. She verbalized understanding and left the department.  INITIAL IMPRESSION / ASSESSMENT AND PLAN / ED COURSE  Pertinent labs & imaging results that were available during my care of the patient were reviewed by me and considered in my medical decision making (see chart for details).  ____________________________________________   FINAL CLINICAL IMPRESSION(S) / ED DIAGNOSES  Final diagnoses:  Missed abortion    Note:  This document was prepared using Dragon voice recognition software and may include unintentional dictation errors.   Chinita Pesterriplett, Jerusalen Mateja B, FNP 11/22/18 2324    Willy Eddyobinson, Patrick, MD 11/22/18 [phone]

## 2018-11-22 NOTE — ED Triage Notes (Signed)
States she is 8-[redacted] weeks pregnant.  States had an episode of vaginal bleeding yesterday.  Bleeding initially bleeding stopped, but returned this morning.  Patient also c/o LLQ pain.  Seen at [facility], and was referred to ED for evaluation.  Patient states she is rH negative.  G:5  P: 3

## 2018-11-22 NOTE — ED Provider Notes (Signed)
[facility] ____________________________________________  Time seen: Approximately 1:31 PM  I have reviewed the triage vital signs and the nursing notes.   HISTORY  Chief Complaint Abdominal Pain and Vaginal Bleeding    HPI Alexis Berg is a 28 y.o. female who is gravida 5, para 3 approximately 8/[redacted] weeks pregnant presenting for evaluation of abdominal pain.  Patient reports last 2 days she has been having left lower quadrant abdominal pain as well as yesterday she began vaginal spotting.  States the spotting was one episode yesterday, but reports woke up this morning and has since been having vaginal spotting that is described as pinkish to red mild amount.  Continued abdominal discomfort.  Denies other abdominal pain, fevers, chest pain, shortness of breath or other complaints.  History of a miscarriage at approximately 5 to 6 weeks approximately 1 year ago.  O- blood type.  Department, [facility]: PCP OBGYN: Lauralee Evener  Past Medical History:  Diagnosis Date  . Anxiety   . Heartburn   . Morbid obesity (HCC)   . MRSA (methicillin resistant staph aureus) culture positive    2013/2014-on right leg/ thigh  . Pruritus     Patient Active Problem List   Diagnosis Date Noted  . Anxiety 11/09/2018  . Heartburn 11/09/2018  . Morbid obesity (HCC)   . MRSA (methicillin resistant staph aureus) culture positive     Past Surgical History:  Procedure Laterality Date  . DILATION AND CURETTAGE OF UTERUS    . INCISION AND DRAINAGE       No current facility-administered medications for this encounter.   Current Outpatient Medications:  .  Prenatal Vit-Fe Fumarate-FA (MULTIVITAMIN-PRENATAL) 27-0.8 MG TABS tablet, Take 1 tablet by mouth daily at 12 noon., Disp: , Rfl:  .  hydrOXYzine (VISTARIL) 25 MG capsule, Take 25 mg by mouth every 6 (six) hours as needed for itching., Disp: , Rfl:  .  pantoprazole (PROTONIX) 20 MG tablet, Take 40 mg by mouth daily as  needed for heartburn., Disp: , Rfl:   Allergies Patient has no known allergies.  Family History  Problem Relation Age of Onset  . Breast cancer Mother 45  . Diabetes Mother   . Thyroid disease Mother   . Heart attack Father 20  . Parkinson's disease Father   . Sickle cell anemia Nephew        brother's son    Social History Social History   Tobacco Use  . Smoking status: Former Smoker    Last attempt to quit: 08/13/2016    Years since quitting: 2.2  . Smokeless tobacco: Never Used  Substance Use Topics  . Alcohol use: Not Currently    Comment: last alcohol 20 April-2 beer  . Drug use: No    Review of Systems Constitutional: No fever Cardiovascular: Denies chest pain. Respiratory: Denies shortness of breath. Gastrointestinal: Positive abdominal pain.  No nausea, no vomiting.  No diarrhea.   Genitourinary: Negative for dysuria. Musculoskeletal: Negative for back pain. Skin: Negative for rash.   ____________________________________________   PHYSICAL EXAM:  VITAL SIGNS: ED Triage Vitals  Enc Vitals Group     BP      Pulse      Resp      Temp      Temp src      SpO2      Weight      Height      Head Circumference      Peak Flow      Pain Score  Pain Loc      Pain Edu?      Excl. in GC?    Vitals:   11/22/18 1332 11/22/18 1333  BP:  99/60  Pulse:  100  Resp:  18  Temp:  99.1 F (37.3 C)  TempSrc:  Oral  SpO2:  97%  Weight: 243 lb (110.2 kg)   Height: 5' (1.524 m)     Constitutional: Alert and oriented. Well appearing and in no acute distress. ENT      Head: Normocephalic and atraumatic. Cardiovascular: Normal rate, regular rhythm. Grossly normal heart sounds.  Good peripheral circulation. Respiratory: Normal respiratory effort without tachypnea nor retractions. Breath sounds are clear and equal bilaterally. No wheezes, rales, rhonchi. Gastrointestinal: Normal Bowel sounds. No CVA tenderness.  Obese abdomen.  Mild to moderate left lower  suprapubic tenderness palpation. Musculoskeletal:  No midline cervical, thoracic or lumbar tenderness to palpation.  Neurologic:  Normal speech and language. Speech is normal. No gait instability.  Skin:  Skin is warm, dry and intact. No rash noted. Psychiatric: Mood and affect are normal. Speech and behavior are normal. Patient exhibits appropriate insight and judgment   ___________________________________________   LABS (all labs ordered are listed, but only abnormal results are displayed)  Labs Reviewed - No data to display   PROCEDURES Procedures    INITIAL IMPRESSION / ASSESSMENT AND PLAN / ED COURSE  Pertinent labs & imaging results that were available during my care of the patient were reviewed by me and considered in my medical decision making (see chart for details).   Patient most recent OB/GYN note from 11/08/2018 reviewed, in which stated patient had a single intrauterine pregnancy gestational sac appearing well compared to improve size with a gestational sac measuring 5 weeks 5 days.  Ultrasound also Showing right ovary normal, left ovary normal with in area superior to the left ovary questionable corpus luteal versus adnexal mass.  And was recommended for patient to have follow-up ultrasound in 2 weeks or be re-soon for worsening complaints.  Patient well-appearing however continues with abdominal pain and vaginal bleeding.  Directed patient go directly to the emergency room for further evaluation due to concern of potential miscarriage as well as patient will likely need RhoGam.  Patient agreed this plan and states that she will go directly to [facility].  Stable at time of discharge. ____________________________________________   FINAL CLINICAL IMPRESSION(S) / ED DIAGNOSES  Final diagnoses:  Vaginal bleeding in pregnancy  LLQ abdominal pain     ED Discharge Orders    None       Note: This dictation was prepared with Dragon dictation along with smaller  phrase technology. Any transcriptional errors that result from this process are unintentional.         Renford DillsMiller, Aunya Lemler, NP 11/22/18 1537

## 2018-11-22 NOTE — Discharge Instructions (Addendum)
Go directly to emergency room.  

## 2018-11-27 ENCOUNTER — Other Ambulatory Visit: Payer: Self-pay | Admitting: Certified Nurse Midwife

## 2018-11-27 ENCOUNTER — Ambulatory Visit
Admission: RE | Admit: 2018-11-27 | Discharge: 2018-11-27 | Disposition: A | Discharge status: Home / Self Care | Payer: BLUE CROSS/BLUE SHIELD | Source: Ambulatory Visit | Attending: Certified Nurse Midwife | Admitting: Certified Nurse Midwife

## 2018-11-27 ENCOUNTER — Encounter: Payer: BLUE CROSS/BLUE SHIELD | Admitting: Obstetrics and Gynecology

## 2018-11-27 ENCOUNTER — Telehealth: Payer: Self-pay

## 2018-11-27 ENCOUNTER — Other Ambulatory Visit: Payer: Self-pay

## 2018-11-27 ENCOUNTER — Ambulatory Visit (INDEPENDENT_AMBULATORY_CARE_PROVIDER_SITE_OTHER): Payer: BLUE CROSS/BLUE SHIELD

## 2018-11-27 DIAGNOSIS — O3680X1 Pregnancy with inconclusive fetal viability, fetus 1: Secondary | ICD-10-CM

## 2018-11-27 DIAGNOSIS — N9489 Other specified conditions associated with female genital organs and menstrual cycle: Secondary | ICD-10-CM | POA: Insufficient documentation

## 2018-11-27 DIAGNOSIS — O26899 Other specified pregnancy related conditions, unspecified trimester: Secondary | ICD-10-CM

## 2018-11-27 DIAGNOSIS — Z6791 Unspecified blood type, Rh negative: Secondary | ICD-10-CM | POA: Diagnosis not present

## 2018-11-27 DIAGNOSIS — R1032 Left lower quadrant pain: Secondary | ICD-10-CM | POA: Insufficient documentation

## 2018-11-27 DIAGNOSIS — Z3A01 Less than 8 weeks gestation of pregnancy: Secondary | ICD-10-CM | POA: Diagnosis not present

## 2018-11-27 DIAGNOSIS — O3680X0 Pregnancy with inconclusive fetal viability, not applicable or unspecified: Secondary | ICD-10-CM | POA: Diagnosis not present

## 2018-11-27 DIAGNOSIS — O26891 Other specified pregnancy related conditions, first trimester: Secondary | ICD-10-CM | POA: Diagnosis not present

## 2018-11-27 MED ORDER — RHO D IMMUNE GLOBULIN 1500 UNIT/2ML IJ SOSY
300.0000 ug | PREFILLED_SYRINGE | Freq: Once | INTRAMUSCULAR | Status: AC
  Administered 2018-11-27: 300 ug via INTRAMUSCULAR

## 2018-11-27 MED ORDER — MISOPROSTOL 200 MCG PO TABS: ORAL_TABLET | ORAL | 0 refills | Status: DC

## 2018-11-27 MED ORDER — IBUPROFEN 800 MG PO TABS: 800.0000 mg | ORAL_TABLET | Freq: Three times a day (TID) | ORAL | 2 refills | Status: DC | PRN

## 2018-11-27 NOTE — Telephone Encounter (Signed)
Pt is in process of miscarrying; having bad pain; passed what she thinks is tissue or something yesterday.  She worked last night and it was really rough on her.  Pt would like a note to be out of work for a few days.  Pt is currently at u/s appt and will come by after that to p/u note.

## 2018-11-27 NOTE — Progress Notes (Signed)
Alexis Berg  presented to [employer] today requesting a note for work. She was seen in ER at [facility] on 5/22 for a threatened abortion. An ultrasound at that time did not see FCA. A previous ultrasound at [facility] at [place] did reveal FCA. She had heavy bleeding and clots last night accompanied by cramping; however, on a [facility] ultrasound today the gestational sac with fetal pole was intact in the LUS. There was no FCA seen. She left the ER on 5/22 without receiving Rhogam, so she was given Rhogam injection today. Blood type is O negative. She is requesting help evacuating the non viable pregnancy. Discussed options with Dr Jean Rosenthal. Due to Covid 19 restrictions, D&C is available only on emergency basis.  Patient would like to try [facility]. Instructed on inserting 800 mcg Cytotec vaginally today and taking Motrin 800 mgm when inserting the Cytotec and every 8 hours prn cramping. Can repeat Cytotec after 3 hours if no response with first dose. Cautioned to go to the ER with excessive bleeding (3 pads or more/hour x 2 hours). Has follow up appointment scheduled here next week. Work note for 5/26 to 12/01/2018.   Farrel Conners, CNM

## 2018-11-27 NOTE — Telephone Encounter (Signed)
Please advise, thank you.

## 2018-11-27 NOTE — Telephone Encounter (Signed)
Saw patient when she arrived. See note.

## 2018-11-27 NOTE — Telephone Encounter (Signed)
Pt called stating she needs a call back ASAP.  [phone]  Called pt - bad connection.  Called back left detailed msg to call me back.  If needs to be seen, to call to be added back in to today's schedule.

## 2018-11-29 DIAGNOSIS — Z3009 Encounter for other general counseling and advice on contraception: Secondary | ICD-10-CM | POA: Diagnosis not present

## 2018-11-29 DIAGNOSIS — Z30013 Encounter for initial prescription of injectable contraceptive: Secondary | ICD-10-CM | POA: Diagnosis not present

## 2018-12-03 NOTE — Progress Notes (Signed)
Obstetric Problem Visit    Chief Complaint:  Chief Complaint  Patient presents with  . Miscarriage    History of Present Illness: Patient is a 28 y.o. [id number] [redacted]w[redacted]d presenting for first trimester bleeding.  The onset of bleeding was 2 weeks ago.  Is bleeding equal to or greater than normal menstrual flow:  Yes Any recent trauma:  No History of prior miscarriage:  Yes Prior ultrasound demonstrating IUP: yes.  Prior ultrasound demonstrating viable IUP:  No Prior Serum HCG:  Yes 4349 5/22 [place] 11/08/2018 CRL [redacted]w[redacted]d with FHT 121, 11/22/2018 CRL [redacted]w[redacted]d no FHT, RL [redacted]w[redacted]d no FHT Rh status: negative s/p rhogam 11/27/2018  Review of Systems: ROS  Past Medical History:  Past Medical History:  Diagnosis Date  . Anxiety   . Heartburn   . Morbid obesity (HCC)   . MRSA (methicillin resistant staph aureus) culture positive    2013/2014-on right leg/ thigh  . Pruritus     Past Surgical History:  Past Surgical History:  Procedure Laterality Date  . DILATION AND CURETTAGE OF UTERUS    . INCISION AND DRAINAGE      Obstetric History: [id number]  Family History:  Family History  Problem Relation Age of Onset  . Breast cancer Mother 24  . Diabetes Mother   . Thyroid disease Mother   . Heart attack Father 96  . Parkinson's disease Father   . Sickle cell anemia Nephew        brother's son    Social History:  Social History   Socioeconomic History  . Marital status: Single    Spouse name: Not on file  . Number of children: 3  . Years of education: Not on file  . Highest education level: Not on file  Occupational History  . Not on file  Social Needs  . Financial resource strain: Not on file  . Food insecurity:    Worry: Not on file    Inability: Not on file  . Transportation needs:    Medical: Not on file    Non-medical: Not on file  Tobacco Use  . Smoking status: Former Smoker    Last attempt to quit: 08/13/2016    Years since quitting: 2.3  . Smokeless tobacco: Never Used   Substance and Sexual Activity  . Alcohol use: Not Currently    Comment: last alcohol 20 April-2 beer  . Drug use: No  . Sexual activity: Yes    Partners: Male    Birth control/protection: Coitus interruptus  Lifestyle  . Physical activity:    Days per week: Not on file    Minutes per session: Not on file  . Stress: Not on file  Relationships  . Social connections:    Talks on phone: Not on file    Gets together: Not on file    Attends religious service: Not on file    Active member of club or organization: Not on file    Attends meetings of clubs or organizations: Not on file    Relationship status: Not on file  . Intimate partner violence:    Fear of current or ex partner: Not on file    Emotionally abused: Not on file    Physically abused: Not on file    Forced sexual activity: Not on file  Other Topics Concern  . Not on file  Social History Narrative  . Not on file    Allergies:  No Known Allergies  Medications: Prior to Admission medications  Medication Sig Start Date End Date Taking? Authorizing Provider  hydrOXYzine (VISTARIL) 25 MG capsule Take 25 mg by mouth every 6 (six) hours as needed for itching.    [provider]  ibuprofen (ADVIL) 800 MG tablet Take 1 tablet (800 mg total) by mouth every 8 (eight) hours as needed for cramping. 11/27/18   Farrel ConnersGutierrez, Colleen, CNM  misoprostol (CYTOTEC) 200 MCG tablet Insert 4 tablets vaginally x 1. May repeat after 3 ours if needed. 11/27/18   Farrel ConnersGutierrez, Colleen, CNM  pantoprazole (PROTONIX) 20 MG tablet Take 40 mg by mouth daily as needed for heartburn.    [provider]  Prenatal Vit-Fe Fumarate-FA (MULTIVITAMIN-PRENATAL) 27-0.8 MG TABS tablet Take 1 tablet by mouth daily at 12 noon.    [provider]    Physical Exam Vitals: Blood pressure 124/81, pulse (!) 109, weight 250 lb (113.4 kg), last menstrual period 09/18/2018.  General: NAD, well nourished, appears stated age HEENT: normocephalic,  anicteric Pulmonary: No increased work of breathing, Extremities: no edema, erythema, or tenderness Neurologic: Grossly intact Psychiatric: mood appropriate, affect full  Assessment: 28 y.o. [id number] 6930w6d presenting for evaluation of missed abortion  Plan: Problem List Items Addressed This Visit    None    Visit Diagnoses    Missed abortion    -  Primary      1)  Condolences were offered to the patient and her family.  I stressed that while emotionally difficult, that this did not occur because of an actions or inactions by the patient.  Somewhere between 10-20% of identified first trimester pregnancies will unfortunately end in miscarriage.  Given this relatively high incidence rate, further diagnostic testing such as chromosome analysis is generally not clinically relevant nor recommended.  Although the chromosomal abnormalities have been implicated at rates as high as 70% in some studies, these are generally random and do not infer and increased risk of recurrence with subsequent pregnancies.  However, 3 or more consecutive first trimester losses are relatively uncommon, and these patient generally do benefit from additional work up to determine a potential modifiable etiology.   We briefly discussed management options including expectant management, medical management, and surgical management as well as their relative success rates and complications. Approximately 80% of first trimester miscarriages will pass successfully but may require a time frame of up to 8 weeks (ACOG Practice Bulletin 150 May 2015 "Early Pregnancy Loss").  Medical management using 800mcg of misoprostil administered every 3-hrs as needed for up to 3 doses speeds up the time frame to completion significantly, has literature supporting its use up to 63 days or 4074w0d gestation and results in a passage rate of 84-85% (ACOG Practice Bulletin 143 March 2014 "Medical Management of First-Trimester Abortion").  Dilation and curettage  has the highest rate of uterine evacuation, but carries with is operative cost, surgical and anesthetic risk.  While these risk are relatively small they nevertheless include infection, bleeding, uterine perforation, formation of uterine synechia, and in rare cases death.  - has failed medical management - interested [facility] proceeding [facility]  2) The patient is Rh negative, rhogam has been administered 11/27/2018.  4) Routine bleeding precautions were discussed with the patient prior the conclusion of today's visit.  5) A total of 15 minutes were spent in face-to-face contact with the patient during this encounter with over half of that time devoted to counseling and coordination of care.    Vena AustriaAndreas Elvi Leventhal, MD, [place], [facility] 12/04/2018, 10:02 AM

## 2018-12-04 ENCOUNTER — Ambulatory Visit (INDEPENDENT_AMBULATORY_CARE_PROVIDER_SITE_OTHER): Payer: BLUE CROSS/BLUE SHIELD | Admitting: Obstetrics and Gynecology

## 2018-12-04 ENCOUNTER — Encounter: Payer: Self-pay | Admitting: Obstetrics and Gynecology

## 2018-12-04 ENCOUNTER — Telehealth: Payer: Self-pay | Admitting: Obstetrics and Gynecology

## 2018-12-04 ENCOUNTER — Other Ambulatory Visit: Payer: Self-pay

## 2018-12-04 VITALS — BP 124/81 | HR 109 | Wt 250.0 lb

## 2018-12-04 DIAGNOSIS — O021 Missed abortion: Secondary | ICD-10-CM

## 2018-12-04 NOTE — Telephone Encounter (Signed)
-----   Message from Vena Austria, MD sent at 12/04/2018  1:39 PM EDT ----- Regarding: D&C Surgery Date:   LOS: same day surgery  Surgery Booking Request Patient Full Name: Alexis Berg MRN: [id number]  DOB: August 28, 1990  Surgeon: Vena Austria, MD  Requested Surgery Date and Time: 12/10/2018 Primary Diagnosis and Code: Missed abortion (failed medical management) Secondary Diagnosis and Code:  Surgical Procedure: Suction D&C L&D Notification:N/A Admission Status: same day surgery Length of Surgery: Special Case Needs: none H&P: Day of procedure (date) Phone Interview or Office Pre-Admit: phone interview Interpreter: No Language: English Medical Clearance: No Special Scheduling Instructions: Covid testing placed

## 2018-12-04 NOTE — Telephone Encounter (Signed)
Patient is aware of Pre-admit Testing phone interview on 12/05/18, and COVID testing on 12/06/18 10:30am-12:30pm @ [facility] [employer] (directions given), and OR on 12/10/18. Patient is aware she will be asked to quarantine after COVID testing. Patient needs a work note beginning Friday 12/06/18 to include COVID quarantine and surgery recover time. Patient would like work note sent through [employer], if possible.

## 2018-12-04 NOTE — Progress Notes (Signed)
NOB 

## 2018-12-05 ENCOUNTER — Other Ambulatory Visit: Payer: Self-pay

## 2018-12-05 ENCOUNTER — Encounter: Payer: Self-pay | Admitting: Obstetrics and Gynecology

## 2018-12-05 ENCOUNTER — Encounter
Admission: RE | Admit: 2018-12-05 | Discharge: 2018-12-05 | Disposition: A | Discharge status: Home / Self Care | Payer: BLUE CROSS/BLUE SHIELD | Source: Ambulatory Visit | Attending: Obstetrics and Gynecology | Admitting: Obstetrics and Gynecology

## 2018-12-05 HISTORY — DX: Gastro-esophageal reflux disease without esophagitis: K21.9

## 2018-12-05 NOTE — Patient Instructions (Signed)
Your procedure is scheduled on: 12-10-18 TUESDAY Report to Same Day Surgery 2nd floor medical mall [facility](Medical Mall Entrance-take elevator on left to 2nd floor.  Check in with surgery information desk.) To find out your arrival time please call [phone] between 1PM - 3PM on 12-09-18 MONDAY  Remember: Instructions that are not followed completely may result in serious medical risk, up to and including death, or upon the discretion of your surgeon and anesthesiologist your surgery may need to be rescheduled.    _x___ 1. Do not eat food after midnight the night before your procedure. NO GUM OR CANDY AFTER MIDNIGHT.  You may drink clear liquids up to 2 hours before you are scheduled to arrive at the hospital for your procedure.  Do not drink clear liquids within 2 hours of your scheduled arrival to the hospital.  Clear liquids include  --Water or Apple juice without pulp  --Clear carbohydrate beverage such as ClearFast or Gatorade  --Black Coffee or Clear Tea (No milk, no creamers, do not add anything to the coffee or Tea   ____Ensure clear carbohydrate drink on the way to the hospital for bariatric patients  ____Ensure clear carbohydrate drink 3 hours before surgery for Dr Rutherford NailByrnett's patients if physician instructed.     __x__ 2. No Alcohol for 24 hours before or after surgery.   __x__3. No Smoking or e-cigarettes for 24 prior to surgery.  Do not use any chewable tobacco products for at least 6 hour prior to surgery   ____  4. Bring all medications with you on the day of surgery if instructed.    __x__ 5. Notify your doctor if there is any change in your medical condition     (cold, fever, infections).    x___6. On the morning of surgery brush your teeth with toothpaste and water.  You may rinse your mouth with mouth wash if you wish.  Do not swallow any toothpaste or mouthwash.   Do not wear jewelry, make-up, hairpins, clips or nail polish.  Do not wear lotions, powders, or perfumes. You  may wear deodorant.  Do not shave 48 hours prior to surgery. Men may shave face and neck.  Do not bring valuables to the hospital.    [facility] for any belongings or valuables.               Contacts, dentures or bridgework may not be worn into surgery.  Leave your suitcase in the car. After surgery it may be brought to your room.  For patients admitted to the hospital, discharge time is determined by your treatment team.  _  Patients discharged the day of surgery will not be allowed to drive home.  You will need someone to drive you home and stay with you the night of your procedure.    Please read over the following fact sheets that you were given:   [facility]  _x___ TAKE THE FOLLOWING MEDICATION THE MORNING OF SURGERY WITH A SMALL SIP OF WATER. These include:  1. PROTONIX  2. TAKE A PROTONIX Monday NIGHT BEFORE BED  3.  4.  5.  6.  ____Fleets enema or Magnesium Citrate as directed.   ____ Use CHG Soap or sage wipes as directed on instruction sheet   ____ Use inhalers on the day of surgery and bring to hospital day of surgery  ____ Stop Metformin and Janumet 2 days prior to surgery.    ____ Take 1/2 of  usual insulin dose the night before surgery and none on the morning surgery.   ____ Follow recommendations from Cardiologist, Pulmonologist or PCP regarding stopping Aspirin, Coumadin, Plavix ,Eliquis, Effient, or Pradaxa, and Pletal.  X____Stop Anti-inflammatories such as Advil, Aleve, Ibuprofen, Motrin, Naproxen, Naprosyn, Goodies powders or aspirin products NOW-OK to take Tylenol   ____ Stop supplements until after surgery.     ____ Bring C-Pap to the hospital.

## 2018-12-05 NOTE — Telephone Encounter (Signed)
Note should be in and I also put I my chart note is so hopefully she can see that

## 2018-12-05 NOTE — Progress Notes (Signed)
    December 05, 2018   Patient: Alexis Berg  Date of Birth: 10/16/1990  Date of Visit: 12/05/2018    To Whom It May Concern:  It is my medical opinion that Donella Anzivino is to remain out of work starting 12/05/2018 until 12/12/2018 secondary to a scheduled procedure.  If you have any questions or concerns, please don't hesitate to call.  Sincerely,  Vena Austria, MD, [facility], [facility] [address] [place], [place] [address] Phone: [phone] Fax: [phone] 12/05/2018, 8:08 AM

## 2018-12-06 ENCOUNTER — Other Ambulatory Visit
Admission: RE | Admit: 2018-12-06 | Discharge: 2018-12-06 | Disposition: A | Discharge status: Home / Self Care | Payer: BC Managed Care – PPO | Source: Ambulatory Visit | Attending: Obstetrics and Gynecology | Admitting: Obstetrics and Gynecology

## 2018-12-06 ENCOUNTER — Other Ambulatory Visit: Payer: Self-pay

## 2018-12-06 ENCOUNTER — Telehealth: Payer: Self-pay

## 2018-12-06 DIAGNOSIS — Z1159 Encounter for screening for other viral diseases: Secondary | ICD-10-CM | POA: Insufficient documentation

## 2018-12-06 LAB — TYPE AND SCREEN
ABO/RH(D): O NEG
Antibody Screen: POSITIVE
Extend sample reason: UNDETERMINED

## 2018-12-06 NOTE — Telephone Encounter (Signed)
Pt returned call.  She wants to know if okay to go to work or not.  She works third shift in a warehouse.  Is she supposed to NOT go to work as part of quarantining?  Pt aware AMS not in office until Wed. [phone]

## 2018-12-06 NOTE — Telephone Encounter (Signed)
Pt calling; is having Covid-19 test done today; needs note to let her job know what's going on - that AMS adv her to quarantine, surgery Tues, and how long out for surgery.  [phone]  Left detailed msg that AMS is out of the office until Wed of next week.  Maybe he can give her a letter on the day of her surgery which is Tues.  Adv if she cannot wait for AMS to call and leave msg on nurse.

## 2018-12-06 NOTE — Telephone Encounter (Signed)
Defer to AMS. It looks like he might surgery planned.

## 2018-12-07 LAB — NOVEL CORONAVIRUS, NAA (HOSP ORDER, SEND-OUT TO REF LAB; TAT 18-24 HRS): SARS-CoV-2, NAA: NOT DETECTED

## 2018-12-08 ENCOUNTER — Encounter: Payer: Self-pay | Admitting: Emergency Medicine

## 2018-12-08 ENCOUNTER — Other Ambulatory Visit: Payer: Self-pay

## 2018-12-08 ENCOUNTER — Emergency Department
Admission: EM | Admit: 2018-12-08 | Discharge: 2018-12-08 | Disposition: A | Discharge status: Home / Self Care | Payer: BC Managed Care – PPO | Attending: Emergency Medicine | Admitting: Emergency Medicine

## 2018-12-08 DIAGNOSIS — Z87891 Personal history of nicotine dependence: Secondary | ICD-10-CM | POA: Diagnosis not present

## 2018-12-08 DIAGNOSIS — K047 Periapical abscess without sinus: Secondary | ICD-10-CM

## 2018-12-08 DIAGNOSIS — Z79899 Other long term (current) drug therapy: Secondary | ICD-10-CM | POA: Insufficient documentation

## 2018-12-08 DIAGNOSIS — R21 Rash and other nonspecific skin eruption: Secondary | ICD-10-CM | POA: Diagnosis not present

## 2018-12-08 DIAGNOSIS — B359 Dermatophytosis, unspecified: Secondary | ICD-10-CM | POA: Diagnosis not present

## 2018-12-08 MED ORDER — AMOXICILLIN 875 MG PO TABS: 875.0000 mg | ORAL_TABLET | Freq: Two times a day (BID) | ORAL | 0 refills | Status: DC

## 2018-12-08 MED ORDER — MAGIC MOUTHWASH W/LIDOCAINE: 5.0000 mL | Freq: Four times a day (QID) | ORAL | 0 refills | Status: DC

## 2018-12-08 MED ORDER — FLUCONAZOLE 150 MG PO TABS: 150.0000 mg | ORAL_TABLET | ORAL | 0 refills | Status: DC

## 2018-12-08 MED ORDER — CLOTRIMAZOLE 1 % EX CREA: 1.0000 "application " | TOPICAL_CREAM | Freq: Two times a day (BID) | CUTANEOUS | 3 refills | Status: DC

## 2018-12-08 NOTE — ED Provider Notes (Signed)
[facility] Emergency Department Provider Note  ____________________________________________  Time seen: Approximately 5:41 PM  I have reviewed the triage vital signs and the nursing notes.   HISTORY  Chief Complaint Rash    HPI Alexis Berg is a 28 y.o. female who presents emergency department with 2 complaints.  Patient reports that she has been breaking out in skin lesions, 2 to the face, 1 to the back, a few small ones to the face.  Patient reports that they are periodic in nature.  She reports that the rash is "scaly.  Patient is also endorsing right-sided dental pain.  She reports that she broke a tooth and she has had ongoing pain and now increasing edema around the tooth.  This is the right first molar.  No other complaints at this time.  No fevers or chills, difficulty breathing or swallowing.  Patient reports that Benadryl helps the rash as far as pruritic factor, however does not improve the rash.         Past Medical History:  Diagnosis Date  . Anxiety   . GERD (gastroesophageal reflux disease)   . Heartburn   . Morbid obesity (HCC)   . MRSA (methicillin resistant staph aureus) culture positive    2013/2014-on right leg/ thigh  . Pruritus     Patient Active Problem List   Diagnosis Date Noted  . Anxiety 11/09/2018  . Heartburn 11/09/2018  . Morbid obesity (HCC)   . MRSA (methicillin resistant staph aureus) culture positive     Past Surgical History:  Procedure Laterality Date  . DILATION AND CURETTAGE OF UTERUS    . INCISION AND DRAINAGE      Prior to Admission medications   Medication Sig Start Date End Date Taking? Authorizing Provider  amoxicillin (AMOXIL) 875 MG tablet Take 1 tablet (875 mg total) by mouth 2 (two) times daily. 12/08/18   Charyl Minervini, Delorise RoyalsJonathan D, PA-C  clotrimazole (LOTRIMIN) 1 % cream Apply 1 application topically 2 (two) times daily. 12/08/18   Kaleea Penner, Delorise RoyalsJonathan D, PA-C  fluconazole (DIFLUCAN) 150 MG tablet  Take 1 tablet (150 mg total) by mouth once a week. 12/08/18   Jeyla Bulger, Delorise RoyalsJonathan D, PA-C  hydrOXYzine (VISTARIL) 25 MG capsule Take 25 mg by mouth every 6 (six) hours as needed for itching.    [provider]  ibuprofen (ADVIL) 800 MG tablet Take 1 tablet (800 mg total) by mouth every 8 (eight) hours as needed for cramping. 11/27/18   Farrel ConnersGutierrez, Colleen, CNM  magic mouthwash w/lidocaine SOLN Take 5 mLs by mouth 4 (four) times daily. 12/08/18   Shervin Cypert, Delorise RoyalsJonathan D, PA-C  misoprostol (CYTOTEC) 200 MCG tablet Insert 4 tablets vaginally x 1. May repeat after 3 ours if needed. Patient not taking: Reported on 12/04/2018 11/27/18   Farrel ConnersGutierrez, Colleen, CNM  pantoprazole (PROTONIX) 20 MG tablet Take 20 mg by mouth daily as needed for heartburn.     [provider]  Probiotic CAPS Take 1 capsule by mouth daily.    [provider]    Allergies Patient has no known allergies.  Family History  Problem Relation Age of Onset  . Breast cancer Mother [id number]  . Diabetes Mother   . Thyroid disease Mother   . Heart attack Father [id number]  . Parkinson's disease Father   . Sickle cell anemia Nephew        brother's son    Social History Social History   Tobacco Use  . Smoking status: Former Smoker    Packs/day:  0.25    Years: 5.00    Pack years: 1.25    Types: Cigarettes    Last attempt to quit: 08/13/2016    Years since quitting: 2.3  . Smokeless tobacco: Never Used  Substance Use Topics  . Alcohol use: Not Currently    Comment: last alcohol 20 April-2 beer  . Drug use: No     Review of Systems  Constitutional: No fever/chills Eyes: No visual changes. No discharge ENT: Positive for right lower dental pain/infection Cardiovascular: no chest pain. Respiratory: no cough. No SOB. Gastrointestinal: No abdominal pain.  No nausea, no vomiting. Musculoskeletal: Negative for musculoskeletal pain. Skin: Positive for scattered pruritic scaly rash. Neurological: Negative for headaches,  focal weakness or numbness. 10-point ROS otherwise negative.  ____________________________________________   PHYSICAL EXAM:  VITAL SIGNS: ED Triage Vitals  Enc Vitals Group     BP 12/08/18 1715 118/70     Pulse Rate 12/08/18 1715 82     Resp 12/08/18 1715 16     Temp 12/08/18 1715 98.9 F (37.2 C)     Temp Source 12/08/18 1715 Oral     SpO2 12/08/18 1715 100 %     Weight --      Height --      Head Circumference --      Peak Flow --      Pain Score 12/08/18 1713 0     Pain Loc --      Pain Edu? --      Excl. in GC? --      Constitutional: Alert and oriented. Well appearing and in no acute distress. Eyes: Conjunctivae are normal. PERRL. EOMI. Head: Atraumatic. ENT:      Ears:       Nose: No congestion/rhinnorhea.      Mouth/Throat: Mucous membranes are moist.  Visualization of the dentition reveals broken/eroded dentition into the gumline of the first molar right lower dentition.  Mild surrounding erythema and edema.  No purulent drainage.  No indication of abscess.  Uvula is midline. Neck: No stridor.   Hematological/Lymphatic/Immunilogical: No cervical lymphadenopathy. Cardiovascular: Normal rate, regular rhythm. Normal S1 and S2.  Good peripheral circulation. Respiratory: Normal respiratory effort without tachypnea or retractions. Lungs CTAB. Good air entry to the bases with no decreased or absent breath sounds. Musculoskeletal: Full range of motion to all extremities. No gross deformities appreciated. Neurologic:  Normal speech and language. No gross focal neurologic deficits are appreciated.  Skin:  Skin is warm, dry and intact.  Patient has scattered areas of scaling, round lesions consistent with tinea infection.  No surrounding erythema or edema concerning for cellulitis.  Patient has 2 lesions to the right foot, 1 to the left upper back, multiple small lesions to the chin. Psychiatric: Mood and affect are normal. Speech and behavior are normal. Patient exhibits  appropriate insight and judgement.   ____________________________________________   LABS (all labs ordered are listed, but only abnormal results are displayed)  Labs Reviewed - No data to display ____________________________________________  EKG   ____________________________________________  RADIOLOGY   No results found.  ____________________________________________    PROCEDURES  Procedure(s) performed:    Procedures    Medications - No data to display   ____________________________________________   INITIAL IMPRESSION / ASSESSMENT AND PLAN / ED COURSE  Pertinent labs & imaging results that were available during my care of the patient were reviewed by me and considered in my medical decision making (see chart for details).  Review of the [place] CSRS was  performed in accordance of the [place] prior to dispensing any controlled drugs.           Patient's diagnosis is consistent with tinea infection, dental infection.  Patient presented to the emergency department with 2 complaints.  Patient complained of right lower dental pain, possible infection as well as scattered skin lesions.  Patient has findings consistent with tinea infection to the foot, left upper back, chin region.  She also has findings consistent with mild dental infection.  Patient will be placed on topical antifungal as well as an oral antifungal and placed on antibiotics and mouthwash for dental pain/infection.. Patient is given ED precautions to return to the ED for any worsening or new symptoms.     ____________________________________________  FINAL CLINICAL IMPRESSION(S) / ED DIAGNOSES  Final diagnoses:  Dermatophytoses  Dental infection      NEW MEDICATIONS STARTED DURING THIS VISIT:  ED Discharge Orders         Ordered    fluconazole (DIFLUCAN) 150 MG tablet  Weekly    Note to Pharmacy:  Take 1 tablet weekly for 6 weeks   12/08/18 1759    clotrimazole (LOTRIMIN) 1 % cream  2  times daily     12/08/18 1759    amoxicillin (AMOXIL) 875 MG tablet  2 times daily     12/08/18 1759    magic mouthwash w/lidocaine SOLN  4 times daily    Note to Pharmacy:  Dispense in a 1/1/1 ratio. Use lidocaine, diphenhydramine, prednisolone   12/08/18 1759              This chart was dictated using voice recognition software/Dragon. Despite best efforts to proofread, errors can occur which can change the meaning. Any change was purely unintentional.    Darletta Moll, PA-C 12/08/18 1759    Nena Polio, MD 12/08/18 2351

## 2018-12-08 NOTE — ED Notes (Signed)

## 2018-12-08 NOTE — ED Triage Notes (Signed)
Pt to ED via POV c/ intermittent hives x 3-4 days. Pt states that benadryl helps with the hives. Pt recently had miscarriage. PT is in NAD

## 2018-12-09 MED ORDER — DOXYCYCLINE HYCLATE 100 MG IV SOLR
200.0000 mg | INTRAVENOUS | Status: AC
  Administered 2018-12-10: 200 mg via INTRAVENOUS
  Filled 2018-12-09: qty 200

## 2018-12-09 NOTE — Telephone Encounter (Addendum)
Patient arrived at office stating her job is about to terminate her if she doesn't provide a work note regarding her absence. Letter provided (see communications).

## 2018-12-09 NOTE — Telephone Encounter (Signed)
Pt called at 1:57 stating her work wanted the note today; job threatening to fire her unless they received a note stating what she has going on and how long she will be out.  Pt came to office and p/u note before this msg was gotten to.

## 2018-12-10 ENCOUNTER — Ambulatory Visit: Payer: BC Managed Care – PPO | Admitting: Anesthesiology

## 2018-12-10 ENCOUNTER — Encounter
Admission: RE | Disposition: A | Discharge status: Home / Self Care | Payer: Self-pay | Source: Home / Self Care | Attending: Obstetrics and Gynecology

## 2018-12-10 ENCOUNTER — Ambulatory Visit
Admission: RE | Admit: 2018-12-10 | Discharge: 2018-12-10 | Disposition: A | Discharge status: Home / Self Care | Payer: BC Managed Care – PPO | Attending: Obstetrics and Gynecology | Admitting: Obstetrics and Gynecology

## 2018-12-10 ENCOUNTER — Other Ambulatory Visit: Payer: Self-pay

## 2018-12-10 ENCOUNTER — Encounter: Payer: Self-pay | Admitting: Anesthesiology

## 2018-12-10 DIAGNOSIS — O99211 Obesity complicating pregnancy, first trimester: Secondary | ICD-10-CM | POA: Insufficient documentation

## 2018-12-10 DIAGNOSIS — Z9889 Other specified postprocedural states: Secondary | ICD-10-CM

## 2018-12-10 DIAGNOSIS — Z3A01 Less than 8 weeks gestation of pregnancy: Secondary | ICD-10-CM | POA: Insufficient documentation

## 2018-12-10 DIAGNOSIS — O021 Missed abortion: Secondary | ICD-10-CM | POA: Insufficient documentation

## 2018-12-10 DIAGNOSIS — F419 Anxiety disorder, unspecified: Secondary | ICD-10-CM | POA: Diagnosis not present

## 2018-12-10 DIAGNOSIS — Z87891 Personal history of nicotine dependence: Secondary | ICD-10-CM | POA: Diagnosis not present

## 2018-12-10 DIAGNOSIS — R12 Heartburn: Secondary | ICD-10-CM | POA: Diagnosis not present

## 2018-12-10 DIAGNOSIS — K219 Gastro-esophageal reflux disease without esophagitis: Secondary | ICD-10-CM | POA: Insufficient documentation

## 2018-12-10 DIAGNOSIS — O26891 Other specified pregnancy related conditions, first trimester: Secondary | ICD-10-CM | POA: Diagnosis not present

## 2018-12-10 DIAGNOSIS — O99611 Diseases of the digestive system complicating pregnancy, first trimester: Secondary | ICD-10-CM | POA: Insufficient documentation

## 2018-12-10 DIAGNOSIS — O99341 Other mental disorders complicating pregnancy, first trimester: Secondary | ICD-10-CM | POA: Diagnosis not present

## 2018-12-10 DIAGNOSIS — L299 Pruritus, unspecified: Secondary | ICD-10-CM | POA: Insufficient documentation

## 2018-12-10 HISTORY — PX: DILATION AND CURETTAGE OF UTERUS: SHX78

## 2018-12-10 SURGERY — DILATION AND CURETTAGE
Anesthesia: General

## 2018-12-10 MED ORDER — SUGAMMADEX SODIUM 500 MG/5ML IV SOLN
INTRAVENOUS | Status: DC | PRN
  Administered 2018-12-10: 300 mg via INTRAVENOUS

## 2018-12-10 MED ORDER — HYDROMORPHONE HCL 1 MG/ML IJ SOLN
0.2500 mg | INTRAMUSCULAR | Status: DC | PRN
  Administered 2018-12-10 (×4): 0.25 mg via INTRAVENOUS

## 2018-12-10 MED ORDER — LIDOCAINE HCL (CARDIAC) PF 100 MG/5ML IV SOSY
PREFILLED_SYRINGE | INTRAVENOUS | Status: DC | PRN
  Administered 2018-12-10: 100 mg via INTRAVENOUS

## 2018-12-10 MED ORDER — PROPOFOL 10 MG/ML IV BOLUS
INTRAVENOUS | Status: AC
  Filled 2018-12-10: qty 20

## 2018-12-10 MED ORDER — PROMETHAZINE HCL 25 MG/ML IJ SOLN
12.5000 mg | Freq: Once | INTRAMUSCULAR | Status: AC
  Administered 2018-12-10: 13:00:00 12.5 mg via INTRAVENOUS

## 2018-12-10 MED ORDER — SODIUM CHLORIDE FLUSH 0.9 % IV SOLN
INTRAVENOUS | Status: AC
  Filled 2018-12-10: qty 10

## 2018-12-10 MED ORDER — HYDROCODONE-ACETAMINOPHEN 5-325 MG PO TABS: 1.0000 | ORAL_TABLET | Freq: Four times a day (QID) | ORAL | 0 refills | Status: DC | PRN

## 2018-12-10 MED ORDER — PROPOFOL 10 MG/ML IV BOLUS
INTRAVENOUS | Status: DC | PRN
  Administered 2018-12-10: 170 mg via INTRAVENOUS
  Administered 2018-12-10: 10 mg via INTRAVENOUS
  Administered 2018-12-10: 30 mg via INTRAVENOUS
  Administered 2018-12-10 (×3): 10 mg via INTRAVENOUS

## 2018-12-10 MED ORDER — DIPHENHYDRAMINE HCL 50 MG/ML IJ SOLN
INTRAMUSCULAR | Status: AC
  Filled 2018-12-10: qty 1

## 2018-12-10 MED ORDER — MIDAZOLAM HCL 2 MG/2ML IJ SOLN
INTRAMUSCULAR | Status: AC
  Filled 2018-12-10: qty 2

## 2018-12-10 MED ORDER — MIDAZOLAM HCL 2 MG/2ML IJ SOLN
INTRAMUSCULAR | Status: DC | PRN
  Administered 2018-12-10: 2 mg via INTRAVENOUS

## 2018-12-10 MED ORDER — DIPHENHYDRAMINE HCL 50 MG/ML IJ SOLN
12.5000 mg | Freq: Once | INTRAMUSCULAR | Status: AC
  Administered 2018-12-10: 12.5 mg via INTRAVENOUS

## 2018-12-10 MED ORDER — FENTANYL CITRATE (PF) 100 MCG/2ML IJ SOLN
INTRAMUSCULAR | Status: AC
  Filled 2018-12-10: qty 2

## 2018-12-10 MED ORDER — HYDROMORPHONE HCL 1 MG/ML IJ SOLN
INTRAMUSCULAR | Status: AC
  Administered 2018-12-10: 0.25 mg via INTRAVENOUS
  Filled 2018-12-10: qty 1

## 2018-12-10 MED ORDER — ACETAMINOPHEN NICU IV SYRINGE 10 MG/ML
INTRAVENOUS | Status: AC
  Filled 2018-12-10: qty 1

## 2018-12-10 MED ORDER — PROMETHAZINE HCL 25 MG/ML IJ SOLN
INTRAMUSCULAR | Status: AC
  Administered 2018-12-10: 13:00:00 12.5 mg via INTRAVENOUS
  Filled 2018-12-10: qty 1

## 2018-12-10 MED ORDER — ROCURONIUM BROMIDE 100 MG/10ML IV SOLN
INTRAVENOUS | Status: DC | PRN
  Administered 2018-12-10: 50 mg via INTRAVENOUS

## 2018-12-10 MED ORDER — FENTANYL CITRATE (PF) 100 MCG/2ML IJ SOLN
INTRAMUSCULAR | Status: DC | PRN
  Administered 2018-12-10 (×2): 50 ug via INTRAVENOUS

## 2018-12-10 MED ORDER — LACTATED RINGERS IV SOLN
INTRAVENOUS | Status: DC
  Administered 2018-12-10: 12:00:00 via INTRAVENOUS

## 2018-12-10 MED ORDER — ONDANSETRON HCL 4 MG/2ML IJ SOLN
INTRAMUSCULAR | Status: DC | PRN
  Administered 2018-12-10: 4 mg via INTRAVENOUS

## 2018-12-10 MED ORDER — DEXAMETHASONE SODIUM PHOSPHATE 10 MG/ML IJ SOLN
INTRAMUSCULAR | Status: DC | PRN
  Administered 2018-12-10: 8 mg via INTRAVENOUS

## 2018-12-10 MED ORDER — ACETAMINOPHEN 10 MG/ML IV SOLN
INTRAVENOUS | Status: DC | PRN
  Administered 2018-12-10: 1000 mg via INTRAVENOUS

## 2018-12-10 SURGICAL SUPPLY — 11 items
CATH ROBINSON RED A/P 16FR (CATHETERS) ×3 IMPLANT
COVER WAND RF STERILE (DRAPES) ×3 IMPLANT
GLOVE BIO SURGEON STRL SZ7 (GLOVE) ×3 IMPLANT
GLOVE INDICATOR 7.5 STRL GRN (GLOVE) ×3 IMPLANT
GOWN STRL REUS W/ TWL LRG LVL3 (GOWN DISPOSABLE) ×2 IMPLANT
GOWN STRL REUS W/TWL LRG LVL3 (GOWN DISPOSABLE) ×4
KIT TURNOVER CYSTO (KITS) ×3 IMPLANT
PACK DNC HYST (MISCELLANEOUS) ×3 IMPLANT
PAD OB MATERNITY 4.3X12.25 (PERSONAL CARE ITEMS) ×3 IMPLANT
PAD PREP 24X41 OB/GYN DISP (PERSONAL CARE ITEMS) ×3 IMPLANT
VACURETTE 8MM F TIP (MISCELLANEOUS) ×3 IMPLANT

## 2018-12-10 NOTE — Anesthesia Procedure Notes (Signed)
Procedure Name: Intubation Date/Time: 12/10/2018 11:47 AM Performed by: Justus Memory, CRNA Pre-anesthesia Checklist: Patient identified, Patient being monitored, Timeout performed, Emergency Drugs available and Suction available Patient Re-evaluated:Patient Re-evaluated prior to induction Oxygen Delivery Method: Circle system utilized Preoxygenation: Pre-oxygenation with 100% oxygen Induction Type: IV induction Ventilation: Mask ventilation without difficulty Laryngoscope Size: Mac and 3 Grade View: Grade I Tube type: Oral Tube size: 7.0 mm Number of attempts: 1 Airway Equipment and Method: Stylet Placement Confirmation: ETT inserted through vocal cords under direct vision,  positive ETCO2 and breath sounds checked- equal and bilateral Secured at: 21 cm Tube secured with: Tape Dental Injury: Teeth and Oropharynx as per pre-operative assessment

## 2018-12-10 NOTE — Anesthesia Post-op Follow-up Note (Signed)
Anesthesia QCDR form completed.        

## 2018-12-10 NOTE — Transfer of Care (Addendum)
Immediate Anesthesia Transfer of Care Note  Patient: Alexis Berg  Procedure(s) Performed: DILATATION AND CURETTAGE SUCTION (N/A )  Patient Location: PACU  Anesthesia Type:General  Level of Consciousness: sedated  Airway & Oxygen Therapy: Patient Spontanous Breathing and Patient connected to face mask oxygen  Post-op Assessment: Report given to RN and Post -op Vital signs reviewed and stable  Post vital signs: Reviewed and stable  Last Vitals:  Vitals Value Taken Time  BP 89/65 12/10/2018 12:41 PM  Temp 37.1 C 12/10/2018 12:41 PM  Pulse 93 12/10/2018 12:53 PM  Resp 22 12/10/2018 12:53 PM  SpO2 100 % 12/10/2018 12:53 PM  Vitals shown include unvalidated device data.  Last Pain:  Vitals:   12/10/18 1241  TempSrc:   PainSc: 0-No pain         Complications: No apparent anesthesia complications

## 2018-12-10 NOTE — H&P (Signed)
Obstetrics & Gynecology Surgery H&P    Chief Complaint: Scheduled Surgery   History of Present Illness: Patient is a 28 y.o. [id number] presenting for scheduled suction D&C, for the treatment or further evaluation of missed abortion failing initial medical management.   Prior Treatments prior to proceeding with surgery include: cytotec regimen for medical management  Preoperative Pap: 07/17/2018 Results: no abnormalities  Preoperative Endometrial biopsy: N/A Preoperative Ultrasound:  11/08/2018 44w2dviable [facility]  [id number]-viable IUP, no FHT Cytotec regimen 11/27/2018 [id number]-viable IUP, no FHT  Review of Systems:10 point review of systems  Past Medical History:  Past Medical History:  Diagnosis Date   Anxiety    GERD (gastroesophageal reflux disease)    Heartburn    Morbid obesity (HCC)    MRSA (methicillin resistant staph aureus) culture positive    2013/2014-on right leg/ thigh   Pruritus     Past Surgical History:  Past Surgical History:  Procedure Laterality Date   DILATION AND CURETTAGE OF UTERUS     INCISION AND DRAINAGE      Family History:  Family History  Problem Relation Age of Onset   Breast cancer Mother 75 35Diabetes Mother    Thyroid disease Mother    Heart attack Father 78 44Parkinson's disease Father    Sickle cell anemia Nephew        brother's son    Social History:  Social History   Socioeconomic History   Marital status: Single    Spouse name: Not on file   Number of children: 3   Years of education: Not on file   Highest education level: Not on file  Occupational History   Not on file  Social Needs   Financial resource strain: Not on file   Food insecurity:    Worry: Not on file    Inability: Not on file   Transportation needs:    Medical: Not on file    Non-medical: Not on file  Tobacco Use   Smoking status: Former Smoker    Packs/day: 0.25    Years: 5.00    Pack years: 1.25      Types: Cigarettes    Last attempt to quit: 08/13/2016    Years since quitting: 2.3   Smokeless tobacco: Never Used  Substance and Sexual Activity   Alcohol use: Not Currently    Comment: last alcohol 20 April-2 beer   Drug use: No   Sexual activity: Yes    Partners: Male    Birth control/protection: Coitus interruptus  Lifestyle   Physical activity:    Days per week: Not on file    Minutes per session: Not on file   Stress: Not on file  Relationships   Social connections:    Talks on phone: Not on file    Gets together: Not on file    Attends religious service: Not on file    Active member of club or organization: Not on file    Attends meetings of clubs or organizations: Not on file    Relationship status: Not on file   Intimate partner violence:    Fear of current or ex partner: Not on file    Emotionally abused: Not on file    Physically abused: Not on file    Forced sexual activity: Not on file  Other Topics Concern   Not on file  Social History Narrative   Not on file    Allergies:  No Known Allergies  Medications: Prior to Admission medications   Medication Sig Start Date End Date Taking? Authorizing Provider  amoxicillin (AMOXIL) 875 MG tablet Take 1 tablet (875 mg total) by mouth 2 (two) times daily. 12/08/18  Yes Cuthriell, Charline Bills, PA-C  fluconazole (DIFLUCAN) 150 MG tablet Take 1 tablet (150 mg total) by mouth once a week. 12/08/18  Yes Cuthriell, Charline Bills, PA-C  hydrOXYzine (VISTARIL) 25 MG capsule Take 25 mg by mouth every 6 (six) hours as needed for itching.   Yes [provider]  ibuprofen (ADVIL) 800 MG tablet Take 1 tablet (800 mg total) by mouth every 8 (eight) hours as needed for cramping. 11/27/18  Yes Dalia Heading, CNM  magic mouthwash w/lidocaine SOLN Take 5 mLs by mouth 4 (four) times daily. 12/08/18  Yes Cuthriell, Charline Bills, PA-C  pantoprazole (PROTONIX) 20 MG tablet Take 20 mg by mouth daily as needed for heartburn.     Yes [provider]  Probiotic CAPS Take 1 capsule by mouth daily.   Yes [provider]  clotrimazole (LOTRIMIN) 1 % cream Apply 1 application topically 2 (two) times daily. 12/08/18   Cuthriell, Charline Bills, PA-C  misoprostol (CYTOTEC) 200 MCG tablet Insert 4 tablets vaginally x 1. May repeat after 3 ours if needed. Patient not taking: Reported on 12/04/2018 11/27/18   Dalia Heading, CNM    Physical Exam Vitals: Last menstrual period 09/18/2018, unknown if currently breastfeeding. General: NAD,.well nourished, appears stated age 11: normocephalic, anicteric Pulmonary: No increased work of breathing, CTAB Cardiovascular: RRR, distal pulses 2+ Abdomen: soft, non-tender Genitourinary: deferred Extremities: no edema, erythema, or tenderness Neurologic: Grossly intact Psychiatric: mood appropriate, affect full  Imaging US Ob Comp Less 14 Wks  Result Date: 11/22/2018 CLINICAL DATA:  Vaginal bleeding with pelvic pain EXAM: OBSTETRIC <14 WK [place] AND TRANSVAGINAL OB US TECHNIQUE: Both transabdominal and transvaginal ultrasound examinations were performed for complete evaluation of the gestation as well as the maternal uterus, adnexal regions, and pelvic cul-de-sac. Transvaginal technique was performed to assess early pregnancy. COMPARISON:  None. FINDINGS: Intrauterine gestational sac: Visualized Yolk sac:  Visualized Embryo:  Visualized Cardiac Activity: Not visualized CRL:  13 mm   7 w   4 d Subchorionic hemorrhage:  None visualized. Maternal uterus/adnexae: Cervical os is closed. Right ovary measures 2.5 x 1.8 x 2.7 cm. Left ovary measures 3.1 x 1.8 x 2.2 cm. No extrauterine pelvic or adnexal mass. No free pelvic fluid. IMPRESSION: 13 mm in length fetal pole without demonstrable cardiac activity. Findings meet definitive criteria for failed pregnancy. This follows SRU consensus guidelines: Diagnostic Criteria for Nonviable Pregnancy Early in the First Trimester. Alison Stalling J Med  [phone]. Study otherwise unremarkable. Electronically Signed   By: Lowella Grip III M.D.   On: 11/22/2018 16:31   US Ob Transvaginal  Result Date: 11/22/2018 CLINICAL DATA:  Vaginal bleeding with pelvic pain EXAM: OBSTETRIC <14 WK [place] AND TRANSVAGINAL OB US TECHNIQUE: Both transabdominal and transvaginal ultrasound examinations were performed for complete evaluation of the gestation as well as the maternal uterus, adnexal regions, and pelvic cul-de-sac. Transvaginal technique was performed to assess early pregnancy. COMPARISON:  None. FINDINGS: Intrauterine gestational sac: Visualized Yolk sac:  Visualized Embryo:  Visualized Cardiac Activity: Not visualized CRL:  13 mm   7 w   4 d Subchorionic hemorrhage:  None visualized. Maternal uterus/adnexae: Cervical os is closed. Right ovary measures 2.5 x 1.8 x 2.7 cm. Left ovary measures 3.1 x 1.8 x 2.2 cm. No extrauterine  pelvic or adnexal mass. No free pelvic fluid. IMPRESSION: 13 mm in length fetal pole without demonstrable cardiac activity. Findings meet definitive criteria for failed pregnancy. This follows SRU consensus guidelines: Diagnostic Criteria for Nonviable Pregnancy Early in the First Trimester. Alison Stalling J Med [phone]. Study otherwise unremarkable. Electronically Signed   By: Lowella Grip III M.D.   On: 11/22/2018 16:31   US Ob Less Than 14 Weeks With Ob Transvaginal  Result Date: 11/27/2018 CLINICAL DATA:  Follow-up viability, continued vaginal bleeding EXAM: OBSTETRIC <14 WK [place] AND TRANSVAGINAL OB US TECHNIQUE: Both transabdominal and transvaginal ultrasound examinations were performed for complete evaluation of the gestation as well as the maternal uterus, adnexal regions, and pelvic cul-de-sac. Transvaginal technique was performed to assess early pregnancy. COMPARISON:  11/22/2018 FINDINGS: Intrauterine gestational sac: Single Yolk sac:  Visualized. Embryo:  Visualized. Cardiac Activity: Not Visualized. CRL: 12 mm   7 w    3 d Subchorionic hemorrhage:  None visualized. Maternal uterus/adnexae: Right ovary is within normal limits and measures 1.6 by 2.7 by 1.7 cm with a volume of 3.7 mL. The left ovary measures 3.9 x 2.5 x 3.1 cm with a volume of 15.7 mL. Probable corpus luteum in the left ovary. Gestational sac is now visible within the lower uterine segment and cervix. No significant free fluid. IMPRESSION: 1. Single intrauterine pregnancy with visible gestational sac, yolk sac and embryo but no cardiac activity as before. The gestational sac and its contents have migrated to the lower uterine segment and cervix since the previous exam. Electronically Signed   By: Donavan Foil M.D.   On: 11/27/2018 20:13   Recent Results (from the past 2160 hour(s))  Cervicovaginal ancillary only     Status: None   Collection Time: 09/17/18 12:00 AM  Result Value Ref Range   Chlamydia Negative     Comment: Normal Reference Range - Negative   Neisseria gonorrhea Negative     Comment: Normal Reference Range - Negative  CMP14+EGFR     Status: None   Collection Time: 09/17/18 10:53 AM  Result Value Ref Range   Glucose 76 65 - 99 mg/dL   BUN 9 6 - 20 mg/dL   Creatinine, Ser 0.64 0.57 - 1.00 mg/dL   GFR calc non Af Amer 120 >59 mL/min/1.73   GFR calc Af Amer 138 >59 mL/min/1.73   BUN/Creatinine Ratio 14 9 - 23   Sodium 140 134 - 144 mmol/L   Potassium 4.0 3.5 - 5.2 mmol/L   Chloride 104 96 - 106 mmol/L   CO2 20 20 - 29 mmol/L   Calcium 9.4 8.7 - 10.2 mg/dL   Total Protein 7.0 6.0 - 8.5 g/dL   Albumin 4.3 3.9 - 5.0 g/dL   Globulin, Total 2.7 1.5 - 4.5 g/dL   Albumin/Globulin Ratio 1.6 1.2 - 2.2   Bilirubin Total 0.2 0.0 - 1.2 mg/dL   Alkaline Phosphatase 70 39 - 117 IU/L   AST 21 0 - 40 IU/L   ALT 18 0 - 32 IU/L  Lipase     Status: None   Collection Time: 09/17/18 10:53 AM  Result Value Ref Range   Lipase 15 14 - 72 U/L  CBC w/Diff/Platelet     Status: None   Collection Time: 09/17/18 10:53 AM  Result Value Ref Range    WBC 8.8 3.4 - 10.8 x10E3/uL   RBC 4.20 3.77 - 5.28 x10E6/uL   Hemoglobin 13.4 11.1 - 15.9 g/dL   Hematocrit 38.7 34.0 - 46.6 %  MCV 92 79 - 97 fL   MCH 31.9 26.6 - 33.0 pg   MCHC 34.6 31.5 - 35.7 g/dL   RDW 12.0 11.7 - 15.4 %   Platelets 320 150 - 450 x10E3/uL   Neutrophils 58 Not Estab. %   Lymphs 31 Not Estab. %   Monocytes 9 Not Estab. %   Eos 1 Not Estab. %   Basos 1 Not Estab. %   Neutrophils Absolute 5.2 1.4 - 7.0 x10E3/uL   Lymphocytes Absolute 2.8 0.7 - 3.1 x10E3/uL   Monocytes Absolute 0.8 0.1 - 0.9 x10E3/uL   EOS (ABSOLUTE) 0.1 0.0 - 0.4 x10E3/uL   Basophils Absolute 0.1 0.0 - 0.2 x10E3/uL   Immature Granulocytes 0 Not Estab. %   Immature Grans (Abs) 0.0 0.0 - 0.1 x10E3/uL  RPR Qual     Status: None   Collection Time: 09/17/18 10:53 AM  Result Value Ref Range   RPR Ser Ql Non Reactive Non Reactive  HIV Antibody (routine testing w rflx)     Status: None   Collection Time: 09/17/18 10:53 AM  Result Value Ref Range   HIV Screen 4th Generation wRfx Non Reactive Non Reactive  Beta hCG quant (ref lab)     Status: None   Collection Time: 09/17/18 10:53 AM  Result Value Ref Range   hCG Quant <1 mIU/mL    Comment:                      Female (Non-pregnant)    0 -     5                             (Postmenopausal)  0 -     8                      Female (Pregnant)                      Weeks of Gestation                              3                6 -    71                              4               10 -   750                              5              217 -  7138                              6              158 - 31795                              7             [id number] -[id number]  Pastoria Roche E CLIA methodology   POCT Wet Prep Len[facility] For[facility] [place])     Status: Normal   Collection Time: 09/17/18  1:05 PM  Result Value Ref Range   Source Wet Prep POC vagina    WBC, Wet Prep HPF POC     Bacteria Wet Prep HPF POC     BACTERIA WET PREP MORPHOLOGY POC     Clue Cells Wet Prep HPF POC None None   Clue Cells Wet Prep Whiff POC     Yeast Wet Prep HPF POC None None   KOH Wet Prep POC     Trichomonas Wet Prep HPF POC Absent Absent  Pregnancy, urine     Status: None   Collection Time: 10/04/18  6:09 PM  Result Value Ref Range   Preg Test, Ur NEGATIVE NEGATIVE    Comment: Performed at [facility], [address], [place], [place] [address]  CBC with Differential     Status: None   Collection Time: 10/04/18  6:23 PM  Result Value Ref Range   WBC 6.5 4.0 - 10.5 K/uL   RBC 4.46 3.87 - 5.11 MIL/uL   Hemoglobin 14.1 12.0 - 15.0 g/dL   HCT 42.9 36.0 - 46.0 %   MCV 96.2 80.0 - 100.0 fL   MCH 31.6 26.0 - 34.0 pg   MCHC 32.9 30.0 - 36.0 g/dL   RDW 12.7  11.5 - 15.5 %   Platelets 306 150 - 400 K/uL   nRBC 0.0 0.0 - 0.2 %   Neutrophils Relative % 56 %   Neutro Abs 3.6 1.7 - 7.7 K/uL   Lymphocytes Relative 33 %   Lymphs Abs 2.2 0.7 - 4.0 K/uL   Monocytes Relative 9 %   Monocytes Absolute 0.6 0.1 - 1.0 K/uL   Eosinophils Relative 1 %   Eosinophils Absolute 0.1 0.0 - 0.5 K/uL   Basophils Relative 1 %   Basophils Absolute 0.1 0.0 - 0.1 K/uL   Immature Granulocytes 0 %   Abs Immature Granulocytes 0.02 0.00 - 0.07 K/uL    Comment: Performed at [facility], [address] [place], [place] [address]  Comprehensive metabolic panel     Status: Abnormal   Collection Time: 10/04/18  6:23 PM  Result Value Ref Range   Sodium 136 135 - 145 mmol/L   Potassium 3.8  3.5 - 5.1 mmol/L   Chloride 103 98 - 111 mmol/L   CO2 24 22 - 32 mmol/L   Glucose, Bld 98 70 - 99 mg/dL   BUN 10 6 - 20 mg/dL   Creatinine, Ser 0.66 0.44 - 1.00 mg/dL   Calcium 9.1 8.9 - 10.3 mg/dL   Total Protein 7.7 6.5 - 8.1 g/dL   Albumin 4.2 3.5 - 5.0 g/dL   AST 22 15 - 41 U/L   ALT 21 0 - 44 U/L   Alkaline Phosphatase 70 38 - 126 U/L   Total Bilirubin <0.1 (L) 0.3 - 1.2 mg/dL   GFR calc non Af Amer >60 >60 mL/min   GFR calc Af Amer >60 >60 mL/min   Anion gap 9 5 - 15    Comment: Performed at [facility], [address] [place], [place] [address]  Lipase, blood     Status: None   Collection Time: 10/04/18  6:23 PM  Result Value Ref Range   Lipase 25 11 - 51 U/L    Comment: Performed at [facility], [address], [place], [place] [address]  POCT urine pregnancy     Status: Abnormal   Collection Time: 11/08/18  4:28 PM  Result Value Ref Range   Preg Test, Ur Positive (A) Negative  ABO/Rh     Status: None   Collection Time: 11/22/18  2:29 PM  Result Value Ref Range   ABO/RH(D)      Jenetta Downer NEG Performed at [facility], [address] [place], [place] [address]   hCG, quantitative, pregnancy     Status: Abnormal   Collection Time: 11/22/18  2:29 PM  Result Value Ref Range   hCG, Beta Chain, Quant, S 4,349 (H) <5 mIU/mL    Comment:          GEST. AGE      CONC.  (mIU/mL)   <=1 WEEK        5 - 50     2 WEEKS       50 - 500     3 WEEKS       100 - 10,000     4 WEEKS     1,000 - 30,000     5 WEEKS     3,500 - 115,000   6-8 WEEKS     12,000 - 270,000    12 WEEKS     15,000 - 220,000        FEMALE AND NON-PREGNANT FEMALE:  LESS THAN 5 mIU/mL Performed at [facility], [address] [place], [place] [address]   CBC     Status: None   Collection Time: 11/22/18  2:29 PM  Result Value Ref Range   WBC 8.0 4.0 - 10.5 K/uL   RBC 4.16 3.87 - 5.11 MIL/uL   Hemoglobin 13.2 12.0 - 15.0 g/dL   HCT 40.2 36.0 - 46.0 %   MCV 96.6  80.0 - 100.0 fL   MCH 31.7 26.0 - 34.0 pg   MCHC 32.8 30.0 - 36.0 g/dL   RDW 12.9 11.5 - 15.5 %   Platelets 350 150 - 400 K/uL   nRBC 0.0 0.0 - 0.2 %    Comment: Performed at [facility], [address] [place], [place] [address]  Basic metabolic panel     Status: Abnormal   Collection Time: 11/22/18  2:29 PM  Result Value Ref Range   Sodium 139 135 - 145 mmol/L   Potassium 4.0 3.5 - 5.1 mmol/L   Chloride 105 98 - 111 mmol/L   CO2 25 22 - 32 mmol/L   Glucose, Bld 110 (H) 70 - 99 mg/dL   BUN 9 6 - 20 mg/dL   Creatinine, Ser 0.70 0.44 - 1.00 mg/dL   Calcium 9.4 8.9 - 10.3 mg/dL   GFR calc non Af Amer >60 >60 mL/min   GFR calc Af Amer >60 >60 mL/min   Anion gap 9 5 - 15    Comment: Performed at [facility], [address] [place], [place] [address]  Pregnancy, urine POC     Status: Abnormal   Collection Time: 11/22/18  2:35 PM  Result Value Ref Range   Preg Test, Ur POSITIVE (A) NEGATIVE    Comment:        THE SENSITIVITY OF THIS METHODOLOGY IS >24 mIU/mL   Novel Coronavirus, NAA (hospital order; send-out to ref lab)     Status: None   Collection Time: 12/06/18 11:27 AM  Result Value Ref Range   SARS-CoV-2, NAA NOT DETECTED NOT DETECTED    Comment: (NOTE) This test was developed and its performance characteristics determined by [employer]. This test has not been FDA cleared or approved. This test has been authorized by FDA under an Emergency Use Authorization (EUA). This test is only authorized for the duration of time the declaration that circumstances exist justifying the authorization of the emergency use of in vitro diagnostic tests for detection of SARS-CoV-2 virus and/or diagnosis of COVID-19 infection under section 564(b)(1) of the Act, 21 U.S.C. 818HUD-1(S)(9), unless the authorization is terminated or revoked sooner. When diagnostic testing is negative, the possibility of a false negative result should be considered in the context  of a patient's recent exposures and the presence of clinical signs and symptoms consistent with COVID-19. An individual without symptoms of COVID-19 and who is not shedding SARS-CoV-2 virus would expect to have a negative (not detected) result in this assay. Performed  At: [facility] [address] [place], [place] [id number] Rush Farmer MD IF:[id number]    Coronavirus Source NASOPHARYNGEAL     Comment: Performed at [facility], [address] [place], [place] [address]  Type and screen Blue     Status: None   Collection Time: 12/06/18 11:27 AM  Result Value Ref Range   ABO/RH(D) O NEG    Antibody Screen POS    Sample Expiration 12/09/2018,2359    Extend sample reason PREGNANT WITHIN 3 MONTHS,  UNABLE TO EXTEND    Antibody Identification      PASSIVELY ACQUIRED ANTI-D Performed at [facility], [address] [place], [place] [address]     Assessment: 27 y.o. [id number] presenting for scheduled suction D&C for missed abortion  Plan: 1) I have discussed with the patient the indications for the procedure. Included in the discussion were the options of therapy, as wall as their individual risks, benefits, and complications. Ample time was given to answer all questions.  After consideration of her history and findings, mutual decision has been made to proceed with D+C/hysteroscopy. While the incidence is low, the risks from this surgery include, but are not limited to, the risks of anesthesia, hemorrhage, infection, perforation, and injury to adjacent structures including bowel, bladder and blood vessels.    2) Routine postoperative instructions were reviewed with the patient and her family in detail today including the expected length of recovery and likely postoperative course.  The patient concurred with the proposed plan, giving informed written consent for the surgery today.  Patient instructed on the importance of being NPO  after midnight prior to her procedure.  If warranted preoperative prophylactic antibiotics and SCDs ordered on call to the OR to meet SCIP guidelines and adhere to recommendation laid forth in [place] Number 104 May 2009  "Antibiotic Prophylaxis for Gynecologic Procedures".     Malachy Mood, MD, [facility], [facility] 12/10/2018, 11:00 AM

## 2018-12-10 NOTE — Discharge Instructions (Signed)
Dilation and Curettage or Vacuum Curettage, Care After °These instructions give you information about caring for yourself after your procedure. Your doctor may also give you more specific instructions. Call your doctor if you have any problems or questions after your procedure. °Follow these instructions at home: °Activity °· Do not drive or use heavy machinery while taking prescription pain medicine. °· For 24 hours after your procedure, avoid driving. °· Take short walks often, followed by rest periods. Ask your doctor what activities are safe for you. After one or two days, you may be able to return to your normal activities. °· Do not lift anything that is heavier than 10 lb (4.5 kg) until your doctor approves. °· For at least 2 weeks, or as long as told by your doctor: °? Do not douche. °? Do not use tampons. °? Do not have sex. °General instructions ° °· Take over-the-counter and prescription medicines only as told by your doctor. This is very important if you take blood thinning medicine. °· Do not take baths, swim, or use a hot tub until your doctor approves. Take showers instead of baths. °· Wear compression stockings as told by your doctor. °· It is up to you to get the results of your procedure. Ask your doctor when your results will be ready. °· Keep all follow-up visits as told by your doctor. This is important. °Contact a doctor if: °· You have very bad cramps that get worse or do not get better with medicine. °· You have very bad pain in your belly (abdomen). °· You cannot drink fluids without throwing up (vomiting). °· You get pain in a different part of the area between your belly and thighs (pelvis). °· You have bad-smelling discharge from your vagina. °· You have a rash. °Get help right away if: °· You are bleeding a lot from your vagina. A lot of bleeding means soaking more than one sanitary pad in an hour, for 2 hours in a row. °· You have clumps of blood (blood clots) coming from your  vagina. °· You have a fever or chills. °· Your belly feels very tender or hard. °· You have chest pain. °· You have trouble breathing. °· You cough up blood. °· You feel dizzy. °· You feel light-headed. °· You pass out (faint). °· You have pain in your neck or shoulder area. °Summary °· Take short walks often, followed by rest periods. Ask your doctor what activities are safe for you. After one or two days, you may be able to return to your normal activities. °· Do not lift anything that is heavier than 10 lb (4.5 kg) until your doctor approves. °· Do not take baths, swim, or use a hot tub until your doctor approves. Take showers instead of baths. °· Contact your doctor if you have any symptoms of infection, like bad-smelling discharge from your vagina. °This information is not intended to replace advice given to you by your health care provider. Make sure you discuss any questions you have with your health care provider. °Document Released: 03/28/2008 Document Revised: 03/06/2016 Document Reviewed: 03/06/2016 °Elsevier Interactive Patient Education © 2019 [employer] ° °AMBULATORY SURGERY  °DISCHARGE INSTRUCTIONS ° ° °1) The drugs that you were given will stay in your system until tomorrow so for the next 24 hours you should not: ° °A) Drive an automobile °B) Make any legal decisions °C) Drink any alcoholic beverage ° ° °2) You may resume regular meals tomorrow.  Today it is better   to start with liquids and gradually work up to solid foods. ° °You may eat anything you prefer, but it is better to start with liquids, then soup and crackers, and gradually work up to solid foods. ° ° °3) Please notify your doctor immediately if you have any unusual bleeding, trouble breathing, redness and pain at the surgery site, drainage, fever, or pain not relieved by medication. ° ° ° °4) Additional Instructions: ° ° ° ° ° ° ° °Please contact your physician with any problems or Same Day Surgery at [phone], Monday through  Friday 6 am to 4 pm, or [place] [facility] Main number at [phone]. ° °

## 2018-12-10 NOTE — Op Note (Signed)
Preoperative Diagnosis: 1) 28 y.o. with missed abortion   Postoperative Diagnosis: 1) 28 y.o. with missed abortion  Operation Performed: Suction dilation and curettage  Indication: 28 year old patient with 7 week 4 day missed abortion having failed medical management  Anesthesia: General  Primary Surgeon: Malachy Mood, MD  Assistant: none  Preoperative Antibiotics: none  Estimated Blood Loss: 50 mL  IV Fluids: 456mL  Urine Output:: 151mL straight cath  Drains or Tubes: none  Implants: none  Specimens Removed: Products of conception  Complications: none  Intraoperative Findings:  Non-enlarged, mobile, anteverted uterus.  Sounding to 9cm.  Small amount of tissue  Patient Condition: stable  Procedure in Detail:  Patient was taken to the operating room were she was administered general endotracheal anesthesia.  She was positioned in the dorsal lithotomy position utilizing Allen stirups, prepped and draped in the usual sterile fashion.  Uterus was noted to be non-enlarged in size, anteverted.   Prior to proceeding with the case a time out was performed.  Attention was turned to the patient's pelvis.  A red rubber catheter was used to empty the patient's bladder.  An operative speculum was placed to allow visualization of the cervix.  The anterior lip of the cervix was grasped with a single tooth tenaculum and the cervix was sequentially dilated using pratt dilators.  A size 8 flexible suction curette was then advanced to the uterine fundus.  Several passes were undertaken to clear the uterine contents.  Sharp curettage was then  performed nothing good uterine cry throughout the cavity.  A final pass of the suction curette was then undertaken.  The resulting specimen was sent to pathology.    The single tooth tenaculum was removed from the cervix.  The tenaculum sites and cervix were noted to be  Hemostatic before removing the operative speculum.  Sponge needle and instrument  counts were corrects times two.  The patient tolerated the procedure well and was taken to the recovery room in stable condition.

## 2018-12-10 NOTE — Anesthesia Preprocedure Evaluation (Addendum)
Anesthesia Evaluation  Patient identified by MRN, date of birth, ID band Patient awake    Reviewed: Allergy & Precautions, H&P , NPO status , Patient's Chart, lab work & pertinent test results  Airway Mallampati: II  TM Distance: >3 FB Neck ROM: full    Dental  (+) Teeth Intact   Pulmonary neg recent URI, former smoker,           Cardiovascular (-) hypertension(-) Past MI negative cardio ROS  (-) dysrhythmias      Neuro/Psych PSYCHIATRIC DISORDERS Anxiety negative neurological ROS     GI/Hepatic Neg liver ROS, GERD  Controlled,  Endo/Other  Morbid obesity  Renal/GU      Musculoskeletal   Abdominal   Peds  Hematology negative hematology ROS (+)   Anesthesia Other Findings Past Medical History: No date: Anxiety No date: GERD (gastroesophageal reflux disease) No date: Heartburn No date: Morbid obesity ([place]) No date: MRSA (methicillin resistant staph aureus) culture positive     Comment:  2013/2014-on right leg/ thigh No date: Pruritus  Past Surgical History: No date: DILATION AND CURETTAGE OF UTERUS No date: INCISION AND DRAINAGE     Reproductive/Obstetrics negative OB ROS                          Anesthesia Physical Anesthesia Plan  ASA: III  Anesthesia Plan: General ETT   Post-op Pain Management:    Induction:   PONV Risk Score and Plan: 4 or greater and Dexamethasone, Ondansetron, Midazolam and Treatment may vary due to age or medical condition  Airway Management Planned:   Additional Equipment:   Intra-op Plan:   Post-operative Plan:   Informed Consent: I have reviewed the patients History and Physical, chart, labs and discussed the procedure including the risks, benefits and alternatives for the proposed anesthesia with the patient or authorized representative who has indicated his/her understanding and acceptance.     Dental Advisory Given  Plan Discussed  with: Anesthesiologist and CRNA  Anesthesia Plan Comments:       Anesthesia Quick Evaluation

## 2018-12-11 ENCOUNTER — Encounter: Payer: Self-pay | Admitting: Obstetrics and Gynecology

## 2018-12-11 LAB — TYPE AND SCREEN
ABO/RH(D): O NEG
Antibody Screen: POSITIVE
Unit division: 0
Unit division: 0

## 2018-12-11 LAB — SURGICAL PATHOLOGY

## 2018-12-11 LAB — BPAM RBC
Blood Product Expiration Date: 202006302359
Blood Product Expiration Date: 202007062359
Unit Type and Rh: 9500
Unit Type and Rh: 9500

## 2018-12-11 NOTE — Anesthesia Postprocedure Evaluation (Addendum)
Anesthesia Post Note  Patient: Desarie Lazarz  Procedure(s) Performed: DILATATION AND CURETTAGE SUCTION (N/A )  Patient location during evaluation: PACU Anesthesia Type: General Level of consciousness: awake and alert Pain management: pain level controlled Vital Signs Assessment: post-procedure vital signs reviewed and stable Respiratory status: spontaneous breathing, nonlabored ventilation, respiratory function stable and patient connected to nasal cannula oxygen Cardiovascular status: blood pressure returned to baseline and stable Postop Assessment: no apparent nausea or vomiting Anesthetic complications: yes Anesthetic complication details: PONVComments: Vomited on emergence, nauseated in PACU     Last Vitals:  Vitals:   12/10/18 1424 12/10/18 1447  BP: (!) 103/53 103/65  Pulse: 87 88  Resp: 20 18  Temp: 37.3 C 37.2 C  SpO2: 100% 100%    Last Pain:  Vitals:   12/10/18 1447  TempSrc: Tympanic  PainSc:                  Durenda Hurt

## 2018-12-18 ENCOUNTER — Encounter: Payer: Self-pay | Admitting: Obstetrics and Gynecology

## 2018-12-18 ENCOUNTER — Telehealth: Payer: Self-pay

## 2018-12-18 NOTE — Telephone Encounter (Signed)
Please advise. Thank you

## 2018-12-18 NOTE — Progress Notes (Signed)
Reports heavier bleeding yesterday and some cramping, has since slowed down again.  Told to monitor, if pick back up ultrasound at time of postoperative visit tomorrow

## 2018-12-18 NOTE — Telephone Encounter (Signed)
Pt calling c/o for the past few days has had bad cramping, sharp;  At work last night she had the worst pain so she went home to try to lie down; early this am she started passing bid blood clots and bleeding.  What's wrong? Pls call as soon as possible.  [phone]

## 2018-12-19 ENCOUNTER — Encounter: Payer: Self-pay | Admitting: Obstetrics and Gynecology

## 2018-12-19 ENCOUNTER — Ambulatory Visit (INDEPENDENT_AMBULATORY_CARE_PROVIDER_SITE_OTHER): Payer: Self-pay | Admitting: Obstetrics and Gynecology

## 2018-12-19 ENCOUNTER — Other Ambulatory Visit: Payer: Self-pay

## 2018-12-19 VITALS — BP 124/82 | HR 102 | Wt 245.0 lb

## 2018-12-19 DIAGNOSIS — Z4889 Encounter for other specified surgical aftercare: Secondary | ICD-10-CM

## 2018-12-19 DIAGNOSIS — Z30011 Encounter for initial prescription of contraceptive pills: Secondary | ICD-10-CM

## 2018-12-19 NOTE — Progress Notes (Signed)
Postoperative Follow-up Patient presents post op from suction D&C 1weeks ago for missed abortion failing medical managment.  Subjective: Patient reports some improvement in her preop symptoms. Eating a regular diet without difficulty. The patient is not having any pain.  Activity: normal activities of daily living.  She did have some heavier bleeding and cramping yesterday which has since subsided again  Objective: Blood pressure 124/82, pulse (!) 102, weight 245 lb (111.1 kg), last menstrual period 09/18/2018, unknown if currently breastfeeding.  General: NAD Pulmonary: no increased work of breathing Abdomen: soft, non-tender, non-distended, incision(s) D/C/I GU: normal external female genitalia, beside TVUS shows thin 41mm endometrial striped without evidence of retained POC Extremities: no edema Neurologic: normal gait    Admission on 12/10/2018, Discharged on 12/10/2018  Component Date Value Ref Range Status  . ABO/RH(D) 12/10/2018 O NEG   Final  . Antibody Screen 12/10/2018 POS   Final  . Sample Expiration 12/10/2018 12/13/2018,2359   Final  . Antibody Identification 10/62/6948 PASSIVELY ACQUIRED ANTI-D   Final  . Unit Number 12/10/2018 [id number]   Final  . Blood Component Type 12/10/2018 RED CELLS,LR   Final  . Unit division 12/10/2018 00   Final  . Status of Unit 12/10/2018 REL FROM [facility]   Final  . Transfusion Status 12/10/2018 OK TO TRANSFUSE   Final  . Crossmatch Result 12/10/2018    Final                   Value:COMPATIBLE Performed at [facility], [address] [place], [place] [address]   . Unit Number 12/10/2018 [id number]   Final  . Blood Component Type 12/10/2018 RED CELLS,LR   Final  . Unit division 12/10/2018 00   Final  . Status of Unit 12/10/2018 REL FROM [facility]   Final  . Transfusion Status 12/10/2018 OK TO TRANSFUSE   Final  . Crossmatch Result 12/10/2018 COMPATIBLE   Final  . SURGICAL PATHOLOGY 12/10/2018    Final        Value:Surgical Pathology CASE: [id number] PATIENT: Alexis Berg Surgical Pathology Report     SPECIMEN SUBMITTED: A. Products of conception  CLINICAL HISTORY: None provided  PRE-OPERATIVE DIAGNOSIS: Missed abortion (failed medical management)  POST-OPERATIVE DIAGNOSIS: Same as pre-op     DIAGNOSIS: A. UTERUS, PRODUCTS OF CONCEPTION; DEGRADATION AND CURETTAGE: - DEGENERATING IMMATURE CHORIONIC VILLI, DECIDUA AND HEMORRHAGE CONSISTENT WITH PRODUCTS OF CONCEPTION.   GROSS DESCRIPTION: A. Labeled: Products of conception Received: Formalin Tissue fragment(s): Multiple Size: Aggregate, 4.0 x 2.1 x 0.6 cm Villous tissue: Possible villous tissue is identified. Fetal tissue: None grossly identified Comment: Received are irregular fragments of tan-pink, membranous and spongy soft tissue admixed with minimal, hemorrhagic material.  No abnormalities are grossly identified.  Block summary: 1-4 - entire specimen   Final Diagnosis performed by Jaquelyn Bitter An                         dree, MD.   Electronically signed 12/11/2018 8:20:53AM The electronic signature indicates that the named Attending Pathologist has evaluated the specimen  Technical component performed at [facility], [address] [place],  [address] Lab: [phone] Dir: Rush Farmer, MD, MMM  Professional component performed at [facility], [facility], [place], [place],  [address] Lab: [phone] Dir: Dellia Nims. Reuel Derby, MD   . Blood Product Unit Number 12/10/2018 [id number]   Final  . PRODUCT CODE 12/10/2018 [id number]   Final  . Unit Type and  Rh 12/10/2018 9500   Final  . Blood Product Expiration Date 12/10/2018 [id number]   Final  . Blood Product Unit Number 12/10/2018 [id number]   Final  . PRODUCT CODE 12/10/2018 [id number]   Final  . Unit Type and Rh 12/10/2018 9500   Final  . Blood Product Expiration Date 12/10/2018 [id number]   Final     Assessment: 28 y.o. s/p suction D&C stable  Plan: Patient has done well after surgery with no apparent complications.  I have discussed the post-operative course to date, and the expected progress moving forward.  The patient understands what complications to be concerned about.  I will see the patient in routine follow up, or sooner if needed.    Activity plan: No restriction.  Contraception - trial of slynd given 1 month sample  Discussed signs of depression to be aware of as well as normal grieving process  EMS today 5mm  Vena AustriaAndreas Sharissa Brierley, MD, Merlinda FrederickFACOG [facility], [facility] 12/20/2018, 1:02 PM

## 2018-12-26 ENCOUNTER — Ambulatory Visit: Payer: BLUE CROSS/BLUE SHIELD | Admitting: Obstetrics and Gynecology

## 2019-01-22 ENCOUNTER — Other Ambulatory Visit: Payer: Self-pay

## 2019-01-22 ENCOUNTER — Encounter: Payer: Self-pay | Admitting: Obstetrics and Gynecology

## 2019-01-22 ENCOUNTER — Ambulatory Visit (INDEPENDENT_AMBULATORY_CARE_PROVIDER_SITE_OTHER): Payer: Self-pay | Admitting: Obstetrics and Gynecology

## 2019-01-22 VITALS — BP 127/76 | HR 102 | Wt 238.0 lb

## 2019-01-22 DIAGNOSIS — Z4889 Encounter for other specified surgical aftercare: Secondary | ICD-10-CM

## 2019-01-22 MED ORDER — SLYND 4 MG PO TABS: 1.0000 | ORAL_TABLET | Freq: Every day | ORAL | 11 refills | Status: DC

## 2019-01-22 NOTE — Progress Notes (Signed)
Postoperative Follow-up Patient presents post op from suction D&C 6weeks ago for missed abortion.  Subjective: Patient reports marked improvement in her preop symptoms. Eating a regular diet without difficulty. The patient is not having any pain.  Activity: normal activities of daily living.  Objective: Blood pressure 127/76, pulse (!) 102, weight 238 lb (108 kg), unknown if currently breastfeeding.  General: NAD Pulmonary: no increased work of breathing GU: normal external female genitalia normal cervix, no CMT, uterus normal in shape and contour, no adnexal tenderness or masses Extremities: no edema Neurologic: normal gait    Admission on 12/10/2018, Discharged on 12/10/2018  Component Date Value Ref Range Status  . ABO/RH(D) 12/10/2018 O NEG   Final  . Antibody Screen 12/10/2018 POS   Final  . Sample Expiration 12/10/2018 12/13/2018,2359   Final  . Antibody Identification 12/10/2018 PASSIVELY ACQUIRED ANTI-D   Final  . Unit Number 12/10/2018 [id number]   Final  . Blood Component Type 12/10/2018 RED CELLS,LR   Final  . Unit division 12/10/2018 00   Final  . Status of Unit 12/10/2018 [facility]   Final  . Transfusion Status 12/10/2018 OK TO TRANSFUSE   Final  . Crossmatch Result 12/10/2018    Final                   Value:COMPATIBLE Performed at [facility], [address] [place], [place] [address]   . Unit Number 12/10/2018 [id number]   Final  . Blood Component Type 12/10/2018 RED CELLS,LR   Final  . Unit division 12/10/2018 00   Final  . Status of Unit 12/10/2018 [facility]   Final  . Transfusion Status 12/10/2018 OK TO TRANSFUSE   Final  . Crossmatch Result 12/10/2018 COMPATIBLE   Final  . SURGICAL PATHOLOGY 12/10/2018    Final                   Value:Surgical Pathology CASE: [id number] PATIENT: Fanny DanceJOHNELE Kleeman Surgical Pathology Report     SPECIMEN SUBMITTED: A. Products of conception  CLINICAL HISTORY: None provided   PRE-OPERATIVE DIAGNOSIS: Missed abortion (failed medical management)  POST-OPERATIVE DIAGNOSIS: Same as pre-op     DIAGNOSIS: A. UTERUS, PRODUCTS OF CONCEPTION; DEGRADATION AND CURETTAGE: - DEGENERATING IMMATURE CHORIONIC VILLI, DECIDUA AND HEMORRHAGE CONSISTENT WITH PRODUCTS OF CONCEPTION.   GROSS DESCRIPTION: A. Labeled: Products of conception Received: Formalin Tissue fragment(s): Multiple Size: Aggregate, 4.0 x 2.1 x 0.6 cm Villous tissue: Possible villous tissue is identified. Fetal tissue: None grossly identified Comment: Received are irregular fragments of tan-pink, membranous and spongy soft tissue admixed with minimal, hemorrhagic material.  No abnormalities are grossly identified.  Block summary: 1-4 - entire specimen   Final Diagnosis performed by Alden ServerErnest An                         dree, MD.   Electronically signed 12/11/2018 8:20:53AM The electronic signature indicates that the named Attending Pathologist has evaluated the specimen  Technical component performed at [facility], [address], [place], [place] [address] Lab: [phone] Dir: Jolene SchimkeSanjai Nagendra, MD, MMM  Professional component performed at [facility], [facility], [address], [place], [place] [address] Lab: [phone] Dir: Georgiann Cockerara C. Oneita Krasubinas, MD   . Blood Product Unit Number 12/10/2018 [id number]   Final  . PRODUCT CODE 12/10/2018 [id number]   Final  . Unit Type and Rh 12/10/2018 9500   Final  . Blood Product Expiration Date 12/10/2018  [id number]   Final  . Blood Product Unit Number 12/10/2018 [id number]   Final  . PRODUCT CODE 12/10/2018 [id number]   Final  . Unit Type and Rh 12/10/2018 9500   Final  . Blood Product Expiration Date 12/10/2018 [id number]   Final    Assessment: 28 y.o. s/p suction D&C stable  Plan: Patient has done well after surgery with no apparent complications.  I have discussed the post-operative course to date, and the expected progress moving  forward.  The patient understands what complications to be concerned about.  I will see the patient in routine follow up, or sooner if needed.     Patient appropriately covered with rhogam 11/20/2018 for Rh negative status  Activity plan: No restriction.  Monitor for appropriate withdrawal bleed on next slynd pack   Malachy Mood, MD, [place], [facility] 01/22/2019, 1:56 PM

## 2019-01-23 ENCOUNTER — Ambulatory Visit: Payer: Medicaid Other | Admitting: Obstetrics and Gynecology

## 2019-01-31 ENCOUNTER — Telehealth: Payer: Self-pay

## 2019-01-31 NOTE — Telephone Encounter (Signed)
Pt requesting call from K Martinique, [place]. VV#[phone]

## 2019-01-31 NOTE — Telephone Encounter (Signed)
LMVM to notify K Martinique, CMA is with a provider. Asked pt to return call with specific questions so that I can assist if able or relay message to [facility].

## 2019-01-31 NOTE — Telephone Encounter (Signed)
Pt has scheduled an appointment with [facility]MS in [place] on 8/6

## 2019-03-05 ENCOUNTER — Ambulatory Visit (INDEPENDENT_AMBULATORY_CARE_PROVIDER_SITE_OTHER): Payer: Self-pay | Admitting: Obstetrics and Gynecology

## 2019-03-05 ENCOUNTER — Encounter: Payer: Self-pay | Admitting: Obstetrics and Gynecology

## 2019-03-05 ENCOUNTER — Other Ambulatory Visit: Payer: Self-pay

## 2019-03-05 VITALS — BP 123/68 | HR 101 | Wt 230.0 lb

## 2019-03-05 DIAGNOSIS — Z131 Encounter for screening for diabetes mellitus: Secondary | ICD-10-CM

## 2019-03-05 DIAGNOSIS — Z1329 Encounter for screening for other suspected endocrine disorder: Secondary | ICD-10-CM

## 2019-03-05 DIAGNOSIS — N912 Amenorrhea, unspecified: Secondary | ICD-10-CM

## 2019-03-05 DIAGNOSIS — Z6841 Body Mass Index (BMI) 40.0 and over, adult: Secondary | ICD-10-CM

## 2019-03-05 DIAGNOSIS — Z3202 Encounter for pregnancy test, result negative: Secondary | ICD-10-CM

## 2019-03-05 DIAGNOSIS — N911 Secondary amenorrhea: Secondary | ICD-10-CM

## 2019-03-05 LAB — POCT URINE PREGNANCY: Preg Test, Ur: NEGATIVE

## 2019-03-05 NOTE — Progress Notes (Signed)
Obstetrics & Gynecology Office Visit   Chief Complaint:  Chief Complaint  Patient presents with  . Amenorrhea    No period since miscarriage    History of Present Illness: 28 y.o. [id number] presenting for the evaluation of absent menstruation.  The patient report Patient's last menstrual period was 12/10/2018.Marland Kitchen.  Preceding the current episode of amenorrhea, the patient's menstrual cycles had been irregular.  The patient is currently sexually active and is not using hormonal contraception.  She does not have any contributing past medical history.  The patient has not started any new medications coinciding with the onset of her symptoms.  The patient is interested in conceiving in the near future.  She does not exercise excessively.  PCOS/[place] Wt Change: no Hirsutism: no Acne: yes Balding: no Lipodystrophy:  no Acanthosis nigricans:  no Striae:  no  Hyperprolactinemia Galactorrhea: no Headaches: yes Vision Changes:  no Prior obstetric hemorrhage: no  Thyroid Temperature Intolerance: yes Constipation or Diarrhea: no Hair Thinning:  no Palpitations:  no  Outlet Obstruction: Prior D&C: yes Prior myomectomy: no Prior LEEP or CKC: no  Review of Systems: Review of Systems  All other systems reviewed and are negative.   Past Medical History:  Past Medical History:  Diagnosis Date  . Anxiety   . GERD (gastroesophageal reflux disease)   . Heartburn   . Morbid obesity (HCC)   . MRSA (methicillin resistant staph aureus) culture positive    2013/2014-on right leg/ thigh  . Pruritus     Past Surgical History:  Past Surgical History:  Procedure Laterality Date  . DILATION AND CURETTAGE OF UTERUS    . DILATION AND CURETTAGE OF UTERUS N/A 12/10/2018   Procedure: DILATATION AND CURETTAGE SUCTION;  Surgeon: Vena AustriaStaebler, Gracilyn Gunia, MD;  Location: [facility] ORS;  Service: Gynecology;  Laterality: N/A;  . INCISION AND DRAINAGE      Gynecologic History: Patient's last menstrual  period was 12/10/2018.  Obstetric History: [id number]  Family History:  Family History  Problem Relation Age of Onset  . Breast cancer Mother 6975  . Diabetes Mother   . Thyroid disease Mother   . Heart attack Father 7978  . Parkinson's disease Father   . Sickle cell anemia Nephew        brother's son    Social History:  Social History   Socioeconomic History  . Marital status: Single    Spouse name: Not on file  . Number of children: 3  . Years of education: Not on file  . Highest education level: Not on file  Occupational History  . Not on file  Social Needs  . Financial resource strain: Not on file  . Food insecurity    Worry: Not on file    Inability: Not on file  . Transportation needs    Medical: Not on file    Non-medical: Not on file  Tobacco Use  . Smoking status: Former Smoker    Packs/day: 0.25    Years: 5.00    Pack years: 1.25    Types: Cigarettes    Quit date: 08/13/2016    Years since quitting: 2.5  . Smokeless tobacco: Never Used  Substance and Sexual Activity  . Alcohol use: Not Currently    Comment: last alcohol 20 April-2 beer  . Drug use: No  . Sexual activity: Yes    Partners: Male  Lifestyle  . Physical activity    Days per week: Not on file    Minutes per session:  Not on file  . Stress: Not on file  Relationships  . Social Musician on phone: Not on file    Gets together: Not on file    Attends religious service: Not on file    Active member of club or organization: Not on file    Attends meetings of clubs or organizations: Not on file    Relationship status: Not on file  . Intimate partner violence    Fear of current or ex partner: Not on file    Emotionally abused: Not on file    Physically abused: Not on file    Forced sexual activity: Not on file  Other Topics Concern  . Not on file  Social History Narrative  . Not on file    Allergies:  No Known Allergies  Medications: Prior to Admission medications    Medication Sig Start Date End Date Taking? Authorizing Provider  clotrimazole (LOTRIMIN) 1 % cream Apply 1 application topically 2 (two) times daily. 12/08/18  Yes Cuthriell, Delorise Royals, PA-C  pantoprazole (PROTONIX) 20 MG tablet Take 20 mg by mouth daily as needed for heartburn.    Yes [provider]  Probiotic CAPS Take 1 capsule by mouth daily.   Yes [provider]    Physical Exam Blood pressure 123/68, pulse (!) 101, weight 230 lb (104.3 kg), last menstrual period 12/10/2018, unknown if currently breastfeeding. Patient's last menstrual period was 12/10/2018. Body mass index is 44.92 kg/m.  General: NAD, well nourished, appears stated age HEENT: normocephalic, anicteric Thyroid: no enlargement, no palpable nodules Pulmonary: No increased work of breathing Neurologic: Grossly intact Psychiatric: mood appropriate, affect full  Female chaperone present for pelvic portion of the physical exam  Assessment: 28 y.o. [id number] presenting with secondary amenorrhea  Plan: Problem List Items Addressed This Visit    None    Visit Diagnoses    Amenorrhea    -  Primary   Relevant Orders   POCT urine pregnancy (Completed)   Secondary amenorrhea       Relevant Orders   [place] Sonohysterogram   TSH+Prl+FSH+TestT+LH+DHEA S...   Estradiol   Hemoglobin A1c   Class 3 severe obesity without serious comorbidity with body mass index (BMI) of 40.0 to 44.9 in adult, unspecified obesity type (HCC)       Relevant Orders   Hemoglobin A1c   Thyroid disorder screening       Relevant Orders   TSH+Prl+FSH+TestT+LH+DHEA S...   Screening for diabetes mellitus       Relevant Orders   Hemoglobin A1c      1) The risk long term risk of amenorrhea were discussed with the patient.  The role of unopposed estrogen in causing amenorrhea and the possible development of endometrial hyperplasia or carcinoma is discussed.  The risk of endometrial hyperplasia is linearly correlated with increasing  BMI given the production of estrone by adipose tissue.    2) Although amenorrhea is the patients presenting complaint, we discussed that rather than simply addressing her symptom the goal of our work up would be aimed at identifying the underlying cause.  Disruption in the axis of any number of endocrine systems may evidence itself in the form of amenorrhea. - PCOS panel obtained  3) Given preceding D&C earlier this year will obtain SIS to rule out Asherman's Syndrome or outlet obstruction as contributing cause   4) Ultrasound ordered for endometrial strip assessment and evaluation of ovaries  5) A total of 20 minutes were spent  in face-to-face contact with the patient during this encounter with over half of that time devoted to counseling and coordination of care.  6) Return in about 1 week (around 03/12/2019) for GYN follow and and sonohysterogram ultrasound.   Malachy Mood, MD, [facility], [facility] 03/05/2019, 2:28 PM

## 2019-03-07 ENCOUNTER — Telehealth: Payer: Self-pay | Admitting: Obstetrics and Gynecology

## 2019-03-07 NOTE — Telephone Encounter (Signed)
Patient is calling for labs results. Please advise. 

## 2019-03-09 LAB — TSH+PRL+FSH+TESTT+LH+DHEA S...
17-Hydroxyprogesterone: <10 ng/dL
Androstenedione: 48 ng/dL (ref 41–262)
DHEA-SO4: 202 ug/dL (ref 84.8–378.0)
FSH: 10.7 m[IU]/mL
LH: 10.1 m[IU]/mL
Prolactin: 8.9 ng/mL (ref 4.8–23.3)
TSH: 1.58 u[IU]/mL (ref 0.450–4.500)
Testosterone, Free: 1.9 pg/mL (ref 0.0–4.2)
Testosterone: 11 ng/dL (ref 8–48)

## 2019-03-09 LAB — HEMOGLOBIN A1C
Est. average glucose Bld gHb Est-mCnc: 105 mg/dL
Hgb A1c MFr Bld: 5.3 % (ref 4.8–5.6)

## 2019-03-09 LAB — ESTRADIOL: Estradiol: 15.8 pg/mL

## 2019-03-11 NOTE — Telephone Encounter (Signed)
Patient Alexis Berg calling to see if her test results have come in. Please advise

## 2019-03-11 NOTE — Telephone Encounter (Signed)
Results are in. Please advise

## 2019-03-18 ENCOUNTER — Other Ambulatory Visit: Payer: Medicaid Other

## 2019-03-18 ENCOUNTER — Encounter: Payer: Medicaid Other | Admitting: Obstetrics and Gynecology

## 2019-03-25 ENCOUNTER — Other Ambulatory Visit: Payer: Self-pay | Admitting: Obstetrics and Gynecology

## 2019-03-25 ENCOUNTER — Other Ambulatory Visit: Payer: Self-pay

## 2019-03-25 ENCOUNTER — Ambulatory Visit
Admission: EM | Admit: 2019-03-25 | Discharge: 2019-03-25 | Disposition: A | Discharge status: Home / Self Care | Payer: BC Managed Care – PPO | Attending: Family Medicine | Admitting: Family Medicine

## 2019-03-25 DIAGNOSIS — N911 Secondary amenorrhea: Secondary | ICD-10-CM

## 2019-03-25 DIAGNOSIS — B373 Candidiasis of vulva and vagina: Secondary | ICD-10-CM | POA: Diagnosis not present

## 2019-03-25 DIAGNOSIS — B3731 Acute candidiasis of vulva and vagina: Secondary | ICD-10-CM

## 2019-03-25 DIAGNOSIS — R1032 Left lower quadrant pain: Secondary | ICD-10-CM

## 2019-03-25 LAB — WET PREP, GENITAL
Clue Cells Wet Prep HPF POC: NONE SEEN
Sperm: NONE SEEN
Trich, Wet Prep: NONE SEEN

## 2019-03-25 MED ORDER — FLUCONAZOLE 150 MG PO TABS: 150.0000 mg | ORAL_TABLET | Freq: Once | ORAL | 0 refills | Status: AC

## 2019-03-25 MED ORDER — MELOXICAM 15 MG PO TABS: 15.0000 mg | ORAL_TABLET | Freq: Every day | ORAL | 0 refills | Status: DC | PRN

## 2019-03-25 NOTE — ED Triage Notes (Signed)
Patient states she is experiencing lower stomach pain, it started 2 days ago also experiencing nausea.

## 2019-03-25 NOTE — Discharge Instructions (Signed)
Pain medication as directed.  Diflucan as directed.  Take care  Dr. Lacinda Axon

## 2019-03-25 NOTE — ED Provider Notes (Signed)
[facility]    CSN: [id number] Arrival date & time: 03/25/19  1050  History   Chief Complaint Chief Complaint  Patient presents with  . Abdominal Pain    lower    HPI  28 year old female presents with lower abdominal pain.  Patient also reports vaginal itching.  Patient reports ongoing lower abdominal pain.  She states that she has had this since she had a D&C in June.  Patient reports that her pain has been worse over the past 1 month and worse over the past 2 days.  She localizes the pain to the left lower quadrant.  She is still not having any menstrual cycles.  She states she has upcoming follow-up with her OB/GYN on Monday and she has a scheduled ultrasound.  Patient rates her pain is 8/10 in severity.  She states that she has had no improvement with over-the-counter analgesics.  No known exacerbating or relieving factors.  Additionally, patient states that she has had vaginal itching for the past few days.  She reports mild discharge.  She is concerned that she has yeast vaginitis.  No other associated symptoms.  No other complaints.  PMH, Surgical Hx, Family Hx, Social History reviewed and updated as below.  Past Medical History:  Diagnosis Date  . Anxiety   . GERD (gastroesophageal reflux disease)   . Heartburn   . Morbid obesity (HCC)   . MRSA (methicillin resistant staph aureus) culture positive    2013/2014-on right leg/ thigh  . Pruritus     Patient Active Problem List   Diagnosis Date Noted  . Anxiety 11/09/2018  . Heartburn 11/09/2018  . Morbid obesity (HCC)   . MRSA (methicillin resistant staph aureus) culture positive     Past Surgical History:  Procedure Laterality Date  . DILATION AND CURETTAGE OF UTERUS    . DILATION AND CURETTAGE OF UTERUS N/A 12/10/2018   Procedure: DILATATION AND CURETTAGE SUCTION;  Surgeon: Vena Austria, MD;  Location: [facility] ORS;  Service: Gynecology;  Laterality: N/A;  . INCISION AND DRAINAGE      OB History    Gravida  5   Para  3   Term  3   Preterm      AB  1   Living  3     SAB  0   TAB  1   Ectopic      Multiple      Live Births  3            Home Medications    Prior to Admission medications   Medication Sig Start Date End Date Taking? Authorizing Provider  clotrimazole (LOTRIMIN) 1 % cream Apply 1 application topically 2 (two) times daily. 12/08/18  Yes Cuthriell, Delorise Royals, PA-C  pantoprazole (PROTONIX) 20 MG tablet Take 20 mg by mouth daily as needed for heartburn.    Yes [provider]  Probiotic CAPS Take 1 capsule by mouth daily.   Yes [provider]  fluconazole (DIFLUCAN) 150 MG tablet Take 1 tablet (150 mg total) by mouth once for 1 dose. Repeat dose in 72 hours. 03/25/19 03/25/19  Tommie Sams, DO  meloxicam (MOBIC) 15 MG tablet Take 1 tablet (15 mg total) by mouth daily as needed for pain. 03/25/19   Tommie Sams, DO    Family History Family History  Problem Relation Age of Onset  . Breast cancer Mother 3  . Diabetes Mother   . Thyroid disease Mother   . Heart attack  Father 75  . Parkinson's disease Father   . Sickle cell anemia Nephew        brother's son    Social History Social History   Tobacco Use  . Smoking status: Former Smoker    Packs/day: 0.25    Years: 5.00    Pack years: 1.25    Types: Cigarettes    Quit date: 08/13/2016    Years since quitting: 2.6  . Smokeless tobacco: Never Used  Substance Use Topics  . Alcohol use: Not Currently    Comment: last alcohol 20 April-2 beer  . Drug use: No     Allergies   Patient has no known allergies.   Review of Systems Review of Systems  Gastrointestinal: Positive for abdominal pain.  Genitourinary: Positive for vaginal discharge.   Physical Exam Triage Vital Signs ED Triage Vitals [03/25/19 1112]  Enc Vitals Group     BP 126/85     Pulse Rate 98     Resp 15     Temp 98.3 F (36.8 C)     Temp Source Oral     SpO2 100 %     Weight 225 lb (102.1 kg)      Height 5' (1.524 m)     Head Circumference      Peak Flow      Pain Score 8     Pain Loc      Pain Edu?      Excl. in [place]?    Updated Vital Signs BP 126/85 (BP Location: Left Arm)   Pulse 98   Temp 98.3 F (36.8 C) (Oral)   Resp 15   Ht 5' (1.524 m)   Wt 102.1 kg   SpO2 100%   Breastfeeding No   BMI 43.94 kg/m   Visual Acuity Right Eye Distance:   Left Eye Distance:   Bilateral Distance:    Right Eye Near:   Left Eye Near:    Bilateral Near:     Physical Exam Vitals signs and nursing note reviewed.  Constitutional:      General: She is not in acute distress.    Appearance: Normal appearance. She is obese. She is not ill-appearing.  HENT:     Head: Normocephalic and atraumatic.  Cardiovascular:     Rate and Rhythm: Normal rate and regular rhythm.  Pulmonary:     Effort: Pulmonary effort is normal.     Breath sounds: Normal breath sounds. No wheezing, rhonchi or rales.  Abdominal:     Palpations: Abdomen is soft.     Comments: Tenderness to palpation in the left lower quadrant.  Genitourinary:    Comments: Pelvic Exam: External: normal female genitalia without lesions or masses Vagina: white discharge noted. Wet prep obtained. Neurological:     Mental Status: She is alert.  Psychiatric:        Behavior: Behavior normal.     Comments: Flat affect.    UC Treatments / Results  Labs (all labs ordered are listed, but only abnormal results are displayed) Labs Reviewed  WET PREP, GENITAL - Abnormal; Notable for the following components:      Result Value   Yeast Wet Prep HPF POC PRESENT (*)    WBC, Wet Prep HPF POC RARE (*)    All other components within normal limits    EKG   Radiology No results found.  Procedures Procedures (including critical care time)  Medications Ordered in UC Medications - No data to display  Initial Impression /  Assessment and Plan / UC Course  I have reviewed the triage vital signs and the nursing notes.  Pertinent  labs & imaging results that were available during my care of the patient were reviewed by me and considered in my medical decision making (see chart for details).    28 year old female presents with left lower quadrant pain as well as yeast vaginitis.  Left lower quadrant pain appears to be a chronic issue.  Advised follow-up with OB/GYN.  She has scheduled follow-up on Monday.  Needs ultrasound.  Treating yeast vaginitis with Diflucan.  Mobic for pain.  Final Clinical Impressions(s) / UC Diagnoses   Final diagnoses:  LLQ pain  Yeast vaginitis     Discharge Instructions     Pain medication as directed.  Diflucan as directed.  Take care  Dr. Adriana Simas    ED Prescriptions    Medication Sig Dispense Auth. Provider   fluconazole (DIFLUCAN) 150 MG tablet Take 1 tablet (150 mg total) by mouth once for 1 dose. Repeat dose in 72 hours. 2 tablet Morayma Godown G, DO   meloxicam (MOBIC) 15 MG tablet Take 1 tablet (15 mg total) by mouth daily as needed for pain. 30 tablet Tommie Sams, DO     PDMP not reviewed this encounter.   Tommie Sams, [place] 03/25/19 1223

## 2019-03-28 ENCOUNTER — Encounter: Payer: Self-pay | Admitting: Obstetrics and Gynecology

## 2019-03-28 ENCOUNTER — Other Ambulatory Visit: Payer: Self-pay

## 2019-03-28 ENCOUNTER — Ambulatory Visit (INDEPENDENT_AMBULATORY_CARE_PROVIDER_SITE_OTHER): Payer: BC Managed Care – PPO | Admitting: Obstetrics and Gynecology

## 2019-03-28 ENCOUNTER — Ambulatory Visit (INDEPENDENT_AMBULATORY_CARE_PROVIDER_SITE_OTHER): Payer: BC Managed Care – PPO

## 2019-03-28 VITALS — BP 108/78 | Wt 227.0 lb

## 2019-03-28 DIAGNOSIS — N911 Secondary amenorrhea: Secondary | ICD-10-CM | POA: Diagnosis not present

## 2019-03-28 MED ORDER — ESTROGENS CONJUGATED 1.25 MG PO TABS: 1.2500 mg | ORAL_TABLET | Freq: Every day | ORAL | 0 refills | Status: DC

## 2019-03-28 MED ORDER — IBUPROFEN 600 MG PO TABS: 600.0000 mg | ORAL_TABLET | Freq: Four times a day (QID) | ORAL | 3 refills | Status: DC | PRN

## 2019-03-28 MED ORDER — MEDROXYPROGESTERONE ACETATE 10 MG PO TABS: 10.0000 mg | ORAL_TABLET | Freq: Every day | ORAL | 0 refills | Status: DC

## 2019-03-28 NOTE — Progress Notes (Signed)
Gynecology Ultrasound Follow Up  Chief Complaint:  Chief Complaint  Patient presents with  . SHGM     History of Present Illness: Patient is a 28 y.o. female who presents today for ultrasound evaluation of secondary amenorrhea .  Ultrasound demonstrates the following findgins Adnexa: no masses seen Uterus: Non-enlarged, no fibroids with endometrial stripe  1.79mm Additional: no free fluid, no filling defects on SIS  Patient finished provera challenge which did not elicit a withdrawal bleed  Review of Systems: Review of Systems  Constitutional: Negative.   Genitourinary: Negative.   Skin: Negative.     Past Medical History:  Past Medical History:  Diagnosis Date  . Anxiety   . GERD (gastroesophageal reflux disease)   . Heartburn   . Morbid obesity (HCC)   . MRSA (methicillin resistant staph aureus) culture positive    2013/2014-on right leg/ thigh  . Pruritus     Past Surgical History:  Past Surgical History:  Procedure Laterality Date  . DILATION AND CURETTAGE OF UTERUS    . DILATION AND CURETTAGE OF UTERUS N/A 12/10/2018   Procedure: DILATATION AND CURETTAGE SUCTION;  Surgeon: Vena Austria, MD;  Location: [facility] ORS;  Service: Gynecology;  Laterality: N/A;  . INCISION AND DRAINAGE      Gynecologic History:  No LMP recorded. (Menstrual status: Irregular Periods).  Family History:  Family History  Problem Relation Age of Onset  . Breast cancer Mother 37  . Diabetes Mother   . Thyroid disease Mother   . Heart attack Father 81  . Parkinson's disease Father   . Sickle cell anemia Nephew        brother's son    Social History:  Social History   Socioeconomic History  . Marital status: Single    Spouse name: Not on file  . Number of children: 3  . Years of education: Not on file  . Highest education level: Not on file  Occupational History  . Not on file  Social Needs  . Financial resource strain: Not on file  . Food insecurity    Worry: Not on  file    Inability: Not on file  . Transportation needs    Medical: Not on file    Non-medical: Not on file  Tobacco Use  . Smoking status: Former Smoker    Packs/day: 0.25    Years: 5.00    Pack years: 1.25    Types: Cigarettes    Quit date: 08/13/2016    Years since quitting: 2.6  . Smokeless tobacco: Never Used  Substance and Sexual Activity  . Alcohol use: Not Currently    Comment: last alcohol 20 April-2 beer  . Drug use: No  . Sexual activity: Yes    Partners: Male  Lifestyle  . Physical activity    Days per week: Not on file    Minutes per session: Not on file  . Stress: Not on file  Relationships  . Social Musician on phone: Not on file    Gets together: Not on file    Attends religious service: Not on file    Active member of club or organization: Not on file    Attends meetings of clubs or organizations: Not on file    Relationship status: Not on file  . Intimate partner violence    Fear of current or ex partner: Not on file    Emotionally abused: Not on file    Physically abused: Not on file  Forced sexual activity: Not on file  Other Topics Concern  . Not on file  Social History Narrative  . Not on file    Allergies:  No Known Allergies  Medications: Prior to Admission medications   Medication Sig Start Date End Date Taking? Authorizing Provider  clotrimazole (LOTRIMIN) 1 % cream Apply 1 application topically 2 (two) times daily. 12/08/18  Yes Cuthriell, Charline Bills, PA-C  meloxicam (MOBIC) 15 MG tablet Take 1 tablet (15 mg total) by mouth daily as needed for pain. 03/25/19  Yes Cook, Jayce G, DO  pantoprazole (PROTONIX) 20 MG tablet Take 20 mg by mouth daily as needed for heartburn.    Yes [provider]  Probiotic CAPS Take 1 capsule by mouth daily.   Yes [provider]  estrogens, conjugated, (PREMARIN) 1.25 MG tablet Take 1 tablet (1.25 mg total) by mouth daily for 14 days. 03/28/19 04/11/19  Malachy Mood, MD   ibuprofen (ADVIL) 600 MG tablet Take 1 tablet (600 mg total) by mouth every 6 (six) hours as needed. 03/28/19   Malachy Mood, MD  medroxyPROGESTERone (PROVERA) 10 MG tablet Take 1 tablet (10 mg total) by mouth daily for 10 days. Start after first 14 days of premarin 03/28/19 04/07/19  Malachy Mood, MD    Physical Exam Vitals: Blood pressure 108/78, weight 227 lb (103 kg).  General: NAD HEENT: normocephalic, anicteric Pulmonary: No increased work of breathing Extremities: no edema, erythema, or tenderness Neurologic: Grossly intact, normal gait Psychiatric: mood appropriate, affect full  [place] Sonohysterogram  Result Date: 03/28/2019 Patient Name: Alexis Berg DOB: 12/31/1990 MRN: [id number] ULTRASOUND REPORT Location: [facility] Date of Service: 03/28/2019 Indications:amenorrhea and SIS Findings: The uterus is anteverted and measures 6.5 x 4.4 x 4.1 cm. Echo texture is homogenous without evidence of focal masses. The Endometrium measures 1.7 mm. Right Ovary measures 2.9 x 1.5 x 1.3 cm. It is normal in appearance. Left Ovary measures 2.7 x 1.3 x 1.4 cm. It is normal in appearance. Survey of the adnexa demonstrates no adnexal masses. There is no free fluid in the cul de sac. Impression: 1. Normal pelvic ultrasound. 2. Thin endometrium. 3. An SIS procedure was performed today. Recommendations: 1.Clinical correlation with the patient's History and Physical Exam. Gweneth Dimitri, RT Images reviewed.  Normal GYN study without visualized pathology.  On SIS no resistance or scarring noted at the level of the cervix on catheter insertion.  Good cavity distention with saline without any filling defects of synechia.  The endometrial lining appeared thin for a premenopausal patient. Malachy Mood, MD, [facility], [facility] 03/28/2019, 1:47 PM    Assessment: 28 y.o. [phone] with secondary amenorrhea Plan: Problem List Items Addressed This Visit    None    Visit  Diagnoses    Secondary amenorrhea    -  Primary      1) Secondary amenorrhea - laboratory evaluation normal - [place] today without evidence of uterine synichia or outlet obstruction - Thin endometrium on ultrasound.  Will prime with estrogen 14 days then provera challenge.  Discussed if successful withdrawal bleed OCP for cycle control vs letrozole cycles if trying to conceive - If failure to achieve withdrawal bleed after estrogen priming of endometrium, endometrial biopsy or hysteroscopy   Malachy Mood, MD, [place], [place]

## 2019-04-17 ENCOUNTER — Other Ambulatory Visit: Payer: Self-pay | Admitting: Family Medicine

## 2019-04-29 ENCOUNTER — Encounter: Payer: Self-pay | Admitting: Obstetrics and Gynecology

## 2019-04-29 ENCOUNTER — Other Ambulatory Visit: Payer: Self-pay

## 2019-04-29 ENCOUNTER — Ambulatory Visit (INDEPENDENT_AMBULATORY_CARE_PROVIDER_SITE_OTHER): Payer: BC Managed Care – PPO | Admitting: Obstetrics and Gynecology

## 2019-04-29 VITALS — BP 110/72 | HR 117 | Wt 217.0 lb

## 2019-04-29 DIAGNOSIS — B373 Candidiasis of vulva and vagina: Secondary | ICD-10-CM

## 2019-04-29 DIAGNOSIS — N912 Amenorrhea, unspecified: Secondary | ICD-10-CM

## 2019-04-29 DIAGNOSIS — B3731 Acute candidiasis of vulva and vagina: Secondary | ICD-10-CM

## 2019-04-29 MED ORDER — FLUCONAZOLE 150 MG PO TABS: 150.0000 mg | ORAL_TABLET | Freq: Once | ORAL | 0 refills | Status: AC

## 2019-04-29 NOTE — Progress Notes (Signed)
Gynecology Ultrasound Follow Up  Chief Complaint:  Chief Complaint  Alexis Berg presents with  . Follow-up    medication provera     History of Present Illness: Alexis Berg is a 28 y.o. [id number] who presents today for follow up of secondary amenorrhea since her suction D&C 12/10/2018 for [redacted]w[redacted]d missed abortion.  Work up has included negative PCOS panel, negative SIS, and failed provera challenge.  Last month we started the Alexis Berg on a 14 day course of premarin 1.25mg  po for 14 days followed by a 10 day course of premarin, this also failed to result in menstrual cycle.  She presents today to discuss next steps, is interested in conceiving in the future.  Review of Systems: Review of Systems  All other systems reviewed and are negative.   Past Medical History:  Past Medical History:  Diagnosis Date  . Anxiety   . GERD (gastroesophageal reflux disease)   . Heartburn   . Morbid obesity (HCC)   . MRSA (methicillin resistant staph aureus) culture positive    2013/2014-on right leg/ thigh  . Pruritus     Past Surgical History:  Past Surgical History:  Procedure Laterality Date  . DILATION AND CURETTAGE OF UTERUS    . DILATION AND CURETTAGE OF UTERUS N/A 12/10/2018   Procedure: DILATATION AND CURETTAGE SUCTION;  Surgeon: Vena Austria, MD;  Location: [facility] OR[facility];  Service: Gynecology;  Laterality: N/A;  . INCISION AND DRAINAGE      Gynecologic History:  No LMP recorded. (Menstrual status: Irregular Periods). Contraception: none Last Pap: 1/15/2020Results were: .no abnormalities  Family History:  Family History  Problem Relation Age of Onset  . Breast cancer Mother 75  . Diabetes Mother   . Thyroid disease Mother   . Heart attack Father 40  . Parkinson's disease Father   . Sickle cell anemia Nephew        brother's son    Social History:  Social History   Socioeconomic History  . Marital status: Single    Spouse name: Not on file  . Number of children: 3  . Years of  education: Not on file  . Highest education level: Not on file  Occupational History  . Not on file  Social Needs  . Financial resource strain: Not on file  . Food insecurity    Worry: Not on file    Inability: Not on file  . Transportation needs    Medical: Not on file    Non-medical: Not on file  Tobacco Use  . Smoking status: Former Smoker    Packs/day: 0.25    Years: 5.00    Pack years: 1.25    Types: Cigarettes    Quit date: 08/13/2016    Years since quitting: 2.7  . Smokeless tobacco: Never Used  Substance and Sexual Activity  . Alcohol use: Not Currently    Comment: last alcohol 20 April-2 beer  . Drug use: No  . Sexual activity: Yes    Partners: Male  Lifestyle  . Physical activity    Days per week: Not on file    Minutes per session: Not on file  . Stress: Not on file  Relationships  . Social Musician on phone: Not on file    Gets together: Not on file    Attends religious service: Not on file    Active member of club or organization: Not on file    Attends meetings of clubs or organizations: Not on file  Relationship status: Not on file  . Intimate partner violence    Fear of current or ex partner: Not on file    Emotionally abused: Not on file    Physically abused: Not on file    Forced sexual activity: Not on file  Other Topics Concern  . Not on file  Social History Narrative  . Not on file    Allergies:  No Known Allergies  Medications: Prior to Admission medications   Medication Sig Start Date End Date Taking? Authorizing Provider  ibuprofen (ADVIL) 600 MG tablet Take 1 tablet (600 mg total) by mouth every 6 (six) hours as needed. 03/28/19  Yes Malachy Mood, MD  medroxyPROGESTERone (PROVERA) 10 MG tablet Take 1 tablet (10 mg total) by mouth daily for 10 days. Start after first 14 days of premarin 03/28/19 04/29/19 Yes Malachy Mood, MD  meloxicam (MOBIC) 15 MG tablet Take 1 tablet (15 mg total) by mouth daily as needed for  pain. 03/25/19  Yes Cook, Jayce G, DO  pantoprazole (PROTONIX) 20 MG tablet Take 20 mg by mouth daily as needed for heartburn.    Yes [provider]  Probiotic CAPS Take 1 capsule by mouth daily.   Yes [provider]  clotrimazole (LOTRIMIN) 1 % cream Apply 1 application topically 2 (two) times daily. Alexis Berg not taking: Reported on 04/29/2019 12/08/18   Cuthriell, Charline Bills, PA-C  estrogens, conjugated, (PREMARIN) 1.25 MG tablet Take 1 tablet (1.25 mg total) by mouth daily for 14 days. 03/28/19 04/11/19  Malachy Mood, MD    Physical Exam Vitals: Blood pressure 110/72, pulse (!) 117, weight 217 lb (98.4 kg).  General: NAD HEENT: normocephalic, anicteric Pulmonary: No increased work of breathing Extremities: no edema, erythema, or tenderness Neurologic: Grossly intact, normal gait Psychiatric: mood appropriate, affect full   Assessment: 28 y.o. [id number] No problem-specific Assessment & Plan notes found for this encounter.   Plan: Problem List Items Addressed This Visit    None    Visit Diagnoses    Amenorrhea    -  Primary   Vaginal candida          1) Secondary amenorrhea - failure to achieve withdrawal bleed even after 2 week course of estrogen priming on endometrium.  Mild cramping noted but no bleeding.  The concern at this point is for some sort of outlet obstruction or uterine synechia although SIS 03/28/2019 revealed a normal shaped uterine cavity and no difficulty encountered during catheterization of the cervix..  Only risk factor would be obstetric D&C 12/10/2018 hwoing degenerating immature chorionic villi and decidua. PCOS labs have likewise been normal.   - Referral to [facility] - I discussed possibility of hysteroscopy to evaluate uterine lining visually   2) Vaginal candida - Rx diflucan   Malachy Mood, MD, [place], [place]

## 2019-05-05 ENCOUNTER — Telehealth: Payer: Self-pay

## 2019-05-05 NOTE — Telephone Encounter (Signed)
Pt calling for next steps from last visit.  [phone]

## 2019-05-05 NOTE — Telephone Encounter (Signed)
The reproductive endocrinologists office will call her the referral has been sent but it may be a week or so before she hears from them

## 2019-05-05 NOTE — Telephone Encounter (Signed)
Please advise 

## 2019-05-06 NOTE — Telephone Encounter (Signed)
Pt aware.

## 2019-05-14 ENCOUNTER — Encounter: Payer: Self-pay | Admitting: Obstetrics and Gynecology

## 2019-05-14 ENCOUNTER — Other Ambulatory Visit: Payer: Self-pay

## 2019-05-14 ENCOUNTER — Ambulatory Visit (INDEPENDENT_AMBULATORY_CARE_PROVIDER_SITE_OTHER): Payer: BC Managed Care – PPO | Admitting: Obstetrics and Gynecology

## 2019-05-14 VITALS — BP 121/78 | HR 102 | Ht 60.0 in | Wt 215.0 lb

## 2019-05-14 DIAGNOSIS — N911 Secondary amenorrhea: Secondary | ICD-10-CM

## 2019-05-14 DIAGNOSIS — N644 Mastodynia: Secondary | ICD-10-CM

## 2019-05-14 DIAGNOSIS — N760 Acute vaginitis: Secondary | ICD-10-CM

## 2019-05-14 DIAGNOSIS — L732 Hidradenitis suppurativa: Secondary | ICD-10-CM

## 2019-05-14 DIAGNOSIS — Z113 Encounter for screening for infections with a predominantly sexual mode of transmission: Secondary | ICD-10-CM

## 2019-05-14 MED ORDER — SULFAMETHOXAZOLE-TRIMETHOPRIM 800-160 MG PO TABS: 1.0000 | ORAL_TABLET | Freq: Two times a day (BID) | ORAL | 0 refills | Status: AC

## 2019-05-14 MED ORDER — FLUCONAZOLE 150 MG PO TABS: 150.0000 mg | ORAL_TABLET | Freq: Once | ORAL | 0 refills | Status: DC

## 2019-05-14 NOTE — Progress Notes (Signed)
Obstetrics & Gynecology Office Visit   Chief Complaint:  Chief Complaint  Patient presents with  . Pelvic Pain    x 4 days  . Vaginal Discharge    wight and thick x 4 days  . Breast Pain    bilateral     History of Present Illness: Ms. Alexis Berg is a 28 y.o. [phone] who LMP was No LMP recorded. (Menstrual status: Irregular Periods)., presents today for a problem visit.   Patient complains of an abnormal vaginal discharge for 4 days. Discharge described as: white and thick. Vaginal symptoms include local irritation.   Other associated symptoms: none.Menstrual pattern: She continues to have amenorrhea. Contraception: none.  She denies recent antibiotic exposure, denies changes in soaps, detergents coinciding with the onset of her symptoms.  She has not previously self treated or been under treatment by another provider for these symptoms.   Also reports boils in the groin and one on her abdomen.  She has had these in that past.  Does have a history of MRSA in the past as well.    Breast tenderness is bilateral, no other associated breast changes or masses.    Review of Systems: 10 point review of systems negative unless otherwise noted in HPI  Past Medical History:  Past Medical History:  Diagnosis Date  . Anxiety   . GERD (gastroesophageal reflux disease)   . Heartburn   . Morbid obesity (HCC)   . MRSA (methicillin resistant staph aureus) culture positive    2013/2014-on right leg/ thigh  . Pruritus     Past Surgical History:  Past Surgical History:  Procedure Laterality Date  . DILATION AND CURETTAGE OF UTERUS    . DILATION AND CURETTAGE OF UTERUS N/A 12/10/2018   Procedure: DILATATION AND CURETTAGE SUCTION;  Surgeon: Vena Austria, MD;  Location: [facility] ORS;  Service: Gynecology;  Laterality: N/A;  . INCISION AND DRAINAGE      Gynecologic History: No LMP recorded. (Menstrual status: Irregular Periods).  Obstetric History: [id number]  Family History:   Family History  Problem Relation Age of Onset  . Breast cancer Mother 7  . Diabetes Mother   . Thyroid disease Mother   . Heart attack Father 39  . Parkinson's disease Father   . Sickle cell anemia Nephew        brother's son    Social History:  Social History   Socioeconomic History  . Marital status: Single    Spouse name: Not on file  . Number of children: 3  . Years of education: Not on file  . Highest education level: Not on file  Occupational History  . Not on file  Social Needs  . Financial resource strain: Not on file  . Food insecurity    Worry: Not on file    Inability: Not on file  . Transportation needs    Medical: Not on file    Non-medical: Not on file  Tobacco Use  . Smoking status: Former Smoker    Packs/day: 0.25    Years: 5.00    Pack years: 1.25    Types: Cigarettes    Quit date: 08/13/2016    Years since quitting: 2.7  . Smokeless tobacco: Never Used  Substance and Sexual Activity  . Alcohol use: Not Currently    Comment: last alcohol 20 April-2 beer  . Drug use: No  . Sexual activity: Yes    Partners: Male  Lifestyle  . Physical activity    Days  per week: Not on file    Minutes per session: Not on file  . Stress: Not on file  Relationships  . Social Musicianconnections    Talks on phone: Not on file    Gets together: Not on file    Attends religious service: Not on file    Active member of club or organization: Not on file    Attends meetings of clubs or organizations: Not on file    Relationship status: Not on file  . Intimate partner violence    Fear of current or ex partner: Not on file    Emotionally abused: Not on file    Physically abused: Not on file    Forced sexual activity: Not on file  Other Topics Concern  . Not on file  Social History Narrative  . Not on file    Allergies:  No Known Allergies  Medications: Prior to Admission medications   Medication Sig Start Date End Date Taking? Authorizing Provider  clotrimazole  (LOTRIMIN) 1 % cream Apply 1 application topically 2 (two) times daily. Patient not taking: Reported on 04/29/2019 12/08/18   Cuthriell, Delorise RoyalsJonathan D, PA-C  estrogens, conjugated, (PREMARIN) 1.25 MG tablet Take 1 tablet (1.25 mg total) by mouth daily for 14 days. 03/28/19 04/11/19  Vena AustriaStaebler, Haileyann Staiger, MD  ibuprofen (ADVIL) 600 MG tablet Take 1 tablet (600 mg total) by mouth every 6 (six) hours as needed. 03/28/19   Vena AustriaStaebler, Sundra Haddix, MD  medroxyPROGESTERone (PROVERA) 10 MG tablet Take 1 tablet (10 mg total) by mouth daily for 10 days. Start after first 14 days of premarin 03/28/19 04/29/19  Vena AustriaStaebler, Joselynne Killam, MD  meloxicam (MOBIC) 15 MG tablet Take 1 tablet (15 mg total) by mouth daily as needed for pain. 03/25/19   Tommie Samsook, Jayce G, DO  pantoprazole (PROTONIX) 20 MG tablet Take 20 mg by mouth daily as needed for heartburn.     [provider]  Probiotic CAPS Take 1 capsule by mouth daily.    [provider]    Physical Exam Vitals:  Vitals:   05/14/19 1006  BP: 121/78  Pulse: (!) 102   No LMP recorded. (Menstrual status: Irregular Periods).  General: NAD, well nourished, appears stated age HEENT: normocephalic, anicteric Pulmonary: No increased work of breathing Breast symmetrical, no tenderness, no palpable nodules or masses, no skin or nipple retraction present, no nipple discharge.  No axillary or supraclavicular lymphadenopathy. Abdomen: Soft, non-tender, non-distended.  Small boild RLQ Genitourinary:  External: Normal external female genitalia.  Normal urethral meatus, normal Bartholin's and Skene's glands.  Several small boils on mons and inner left thigh.  Vagina: Normal vaginal mucosa, no evidence of prolapse.    Cervix: Grossly normal in appearance, no bleeding  Uterus: Non-enlarged, mobile, normal contour.  No CMT  Adnexa: ovaries non-enlarged, no adnexal masses  Rectal: deferred  Lymphatic: no evidence of inguinal lymphadenopathy Extremities: no edema, erythema, or  tenderness Neurologic: Grossly intact Psychiatric: mood appropriate, affect full  Female chaperone present for pelvic  portions of the physical exam  Wet Prep: PH: 4.5 Clue Cells: Negative Fungal elements: Negative Trichomonas: Negative   Assessment: 28 y.o. [id number]   Plan: Problem List Items Addressed This Visit    None    Visit Diagnoses    Routine screening for STI (sexually transmitted infection)    -  Primary   Relevant Orders   NuSwab Vaginitis Plus (VG+)   HEP, RPR, HIV Panel   Acute vaginitis       Relevant Orders  NuSwab Vaginitis Plus (VG+)   Secondary amenorrhea       Relevant Orders   hCG, quantitative, pregnancy   Hydradenitis       Mastalgia         1) Hydradenitis/Boils - Rx bactrim DS given history of MRSA.    2) Vaginal discharge - normal under microscope, given treatement with bactrim rx diflucan if early candida infection   3) Mastalgia - Breast exam normal  4) STI screening offered and accepted  5) Secondary amenorrhea - discussed breast tenderness may be a moliminal sign.  Will recheck HCG as well.  Has follow up with REI scheduled for January  6)  Return in about 2 weeks (around 05/28/2019) for medication follow up.   Malachy Mood, MD, [facility], [employer] 05/14/2019, 10:37 AM

## 2019-05-14 NOTE — Patient Instructions (Signed)
 Hidradenitis Suppurativa Hidradenitis suppurativa is a long-term (chronic) skin disease. It is similar to a severe form of acne, but it affects areas of the body where acne would be unusual, especially areas of the body where skin rubs against skin and becomes moist. These include:  Underarms.  Groin.  Genital area.  Buttocks.  Upper thighs.  Breasts. Hidradenitis suppurativa may start out as small lumps or pimples caused by blocked sweat glands or hair follicles. Pimples may develop into deep sores that break open (rupture) and drain pus. Over time, affected areas of skin may thicken and become scarred. This condition is rare and does not spread from person to person (non-contagious). What are the causes? The exact cause of this condition is not known. It may be related to:  Female and female hormones.  An overactive disease-fighting system (immune system). The immune system may over-react to blocked hair follicles or sweat glands and cause swelling and pus-filled sores. What increases the risk? You are more likely to develop this condition if you:  Are female.  Are 11-55 years old.  Have a family history of hidradenitis suppurativa.  Have a personal history of acne.  Are overweight.  Smoke.  Take the medicine lithium. What are the signs or symptoms? The first symptoms are usually painful bumps in the skin, similar to pimples. The condition may get worse over time (progress), or it may only cause mild symptoms. If the disease progresses, symptoms may include:  Skin bumps getting bigger and growing deeper into the skin.  Bumps rupturing and draining pus.  Itchy, infected skin.  Skin getting thicker and scarred.  Tunnels under the skin (fistulas) where pus drains from a bump.  Pain during daily activities, such as pain during walking if your groin area is affected.  Emotional problems, such as stress or depression. This condition may affect your appearance and  your ability or willingness to wear certain clothes or do certain activities. How is this diagnosed? This condition is diagnosed by a health care provider who specializes in skin diseases (dermatologist). You may be diagnosed based on:  Your symptoms and medical history.  A physical exam.  Testing a pus sample for infection.  Blood tests. How is this treated? Your treatment will depend on how severe your symptoms are. The same treatment will not work for everybody with this condition. You may need to try several treatments to find what works best for you. Treatment may include:  Cleaning and bandaging (dressing) your wounds as needed.  Lifestyle changes, such as new skin care routines.  Taking medicines, such as: ? Antibiotics. ? Acne medicines. ? Medicines to reduce the activity of the immune system. ? A diabetes medicine (metformin). ? Birth control pills, for women. ? Steroids to reduce swelling and pain.  Working with a mental health care provider, if you experience emotional distress due to this condition. If you have severe symptoms that do not get better with medicine, you may need surgery. Surgery may involve:  Using a laser to clear the skin and remove hair follicles.  Opening and draining deep sores.  Removing the areas of skin that are diseased and scarred. Follow these instructions at home: Medicines   Take over-the-counter and prescription medicines only as told by your health care provider.  If you were prescribed an antibiotic medicine, take it as told by your health care provider. Do not stop taking the antibiotic even if your condition improves. Skin care  If you have open wounds,   cover them with a clean dressing as told by your health care provider. Keep wounds clean by washing them gently with soap and water when you bathe.  Do not shave the areas where you get hidradenitis suppurativa.  Do not wear deodorant.  Wear loose-fitting clothes.  Try to  avoid getting overheated or sweaty. If you get sweaty or wet, change into clean, dry clothes as soon as you can.  To help relieve pain and itchiness, cover sore areas with a warm, clean washcloth (warm compress) for 5-10 minutes as often as needed.  If told by your health care provider, take a bleach bath twice a week: ? Fill your bathtub halfway with water. ? Pour in  cup of unscented household bleach. ? Soak in the tub for 5-10 minutes. ? Only soak from the neck down. Avoid water on your face and hair. ? Shower to rinse off the bleach from your skin. General instructions  Learn as much as you can about your disease so that you have an active role in your treatment. Work closely with your health care provider to find treatments that work for you.  If you are overweight, work with your health care provider to lose weight as recommended.  Do not use any products that contain nicotine or tobacco, such as cigarettes and e-cigarettes. If you need help quitting, ask your health care provider.  If you struggle with living with this condition, talk with your health care provider or work with a mental health care provider as recommended.  Keep all follow-up visits as told by your health care provider. This is important. Where to find more information  Hidradenitis Suppurativa Foundation, Inc.: https://www.hs-foundation.org/ Contact a health care provider if you have:  A flare-up of hidradenitis suppurativa.  A fever or chills.  Trouble controlling your symptoms at home.  Trouble doing your daily activities because of your symptoms.  Trouble dealing with emotional problems related to your condition. Summary  Hidradenitis suppurativa is a long-term (chronic) skin disease. It is similar to a severe form of acne, but it affects areas of the body where acne would be unusual.  The first symptoms are usually painful bumps in the skin, similar to pimples. The condition may get worse over time  (progress), or it may only cause mild symptoms.  If you have open wounds, cover them with a clean dressing as told by your health care provider. Keep wounds clean by washing them gently with soap and water when you bathe.  Besides skin care, treatment may include medicines, laser treatment, and surgery. This information is not intended to replace advice given to you by your health care provider. Make sure you discuss any questions you have with your health care provider. Document Released: 02/01/2004 Document Revised: 06/27/2017 Document Reviewed: 06/27/2017 Elsevier Patient Education  2020 Elsevier Inc.  

## 2019-05-15 LAB — HEP, RPR, HIV PANEL
HIV Screen 4th Generation wRfx: NONREACTIVE
Hepatitis B Surface Ag: NEGATIVE
RPR Ser Ql: NONREACTIVE

## 2019-05-15 LAB — BETA HCG QUANT (REF LAB): hCG Quant: <1 m[IU]/mL

## 2019-05-19 LAB — NUSWAB VAGINITIS PLUS (VG+)
Candida albicans, NAA: NEGATIVE
Candida glabrata, NAA: NEGATIVE
Chlamydia trachomatis, NAA: NEGATIVE
Neisseria gonorrhoeae, NAA: NEGATIVE
Trich vag by NAA: NEGATIVE

## 2019-05-20 NOTE — Telephone Encounter (Signed)
Pt has been seen since

## 2019-06-05 ENCOUNTER — Other Ambulatory Visit: Payer: Self-pay

## 2019-06-05 ENCOUNTER — Encounter: Payer: Self-pay | Admitting: Obstetrics and Gynecology

## 2019-06-05 ENCOUNTER — Ambulatory Visit (INDEPENDENT_AMBULATORY_CARE_PROVIDER_SITE_OTHER): Payer: BC Managed Care – PPO | Admitting: Obstetrics and Gynecology

## 2019-06-05 VITALS — BP 127/79 | Wt 212.0 lb

## 2019-06-05 DIAGNOSIS — L732 Hidradenitis suppurativa: Secondary | ICD-10-CM

## 2019-06-05 DIAGNOSIS — N911 Secondary amenorrhea: Secondary | ICD-10-CM

## 2019-06-05 NOTE — Progress Notes (Signed)
Obstetrics & Gynecology Office Visit   Chief Complaint:  Chief Complaint  Patient presents with  . Follow-up    boils in groin    History of Present Illness: 28 y.o. [id number] presenting for medication follow up for a diagnosis of vuvlar boils.  She is currently being managed with antibiotics.   The patient reports improvement in symptoms but continued boils.  Previously noted areas have improved but new areas have developed.  The patient does report recent spontaneous menstruation.  Is awaiting REI work up for secondary amenorrhea.  Blood was dark brown.   She has not noted any side-effects or new symptoms.    Review of Systems: review of systems negative unless noted in HPI  Past Medical History:  Past Medical History:  Diagnosis Date  . Anxiety   . GERD (gastroesophageal reflux disease)   . Heartburn   . Morbid obesity ([facility])   . MRSA (methicillin resistant staph aureus) culture positive    2013/2014-on right leg/ thigh  . Pruritus     Past Surgical History:  Past Surgical History:  Procedure Laterality Date  . DILATION AND CURETTAGE OF UTERUS    . DILATION AND CURETTAGE OF UTERUS N/A 12/10/2018   Procedure: DILATATION AND CURETTAGE SUCTION;  Surgeon: Malachy Mood, MD;  Location: [facility]S;  Service: Gynecology;  Laterality: N/A;  . INCISION AND DRAINAGE      Gynecologic History: No LMP recorded. (Menstrual status: Irregular Periods).  Obstetric History: [id number]  Family History:  Family History  Problem Relation Age of Onset  . Breast cancer Mother 23  . Diabetes Mother   . Thyroid disease Mother   . Heart attack Father 73  . Parkinson's disease Father   . Sickle cell anemia Nephew        brother's son    Social History:  Social History   Socioeconomic History  . Marital status: Single    Spouse name: Not on file  . Number of children: 3  . Years of education: Not on file  . Highest education level: Not on file  Occupational History  . Not on file   Social Needs  . Financial resource strain: Not on file  . Food insecurity    Worry: Not on file    Inability: Not on file  . Transportation needs    Medical: Not on file    Non-medical: Not on file  Tobacco Use  . Smoking status: Former Smoker    Packs/day: 0.25    Years: 5.00    Pack years: 1.25    Types: Cigarettes    Quit date: 08/13/2016    Years since quitting: 2.8  . Smokeless tobacco: Never Used  Substance and Sexual Activity  . Alcohol use: Not Currently    Comment: last alcohol 20 April-2 beer  . Drug use: No  . Sexual activity: Yes    Partners: Male  Lifestyle  . Physical activity    Days per week: Not on file    Minutes per session: Not on file  . Stress: Not on file  Relationships  . Social Herbalist on phone: Not on file    Gets together: Not on file    Attends religious service: Not on file    Active member of club or organization: Not on file    Attends meetings of clubs or organizations: Not on file    Relationship status: Not on file  . Intimate partner violence  Fear of current or ex partner: Not on file    Emotionally abused: Not on file    Physically abused: Not on file    Forced sexual activity: Not on file  Other Topics Concern  . Not on file  Social History Narrative  . Not on file    Allergies:  No Known Allergies  Medications: Prior to Admission medications   Medication Sig Start Date End Date Taking? Authorizing Provider  clotrimazole (LOTRIMIN) 1 % cream Apply 1 application topically 2 (two) times daily. 12/08/18   Cuthriell, Delorise Royals, PA-C  estrogens, conjugated, (PREMARIN) 1.25 MG tablet Take 1 tablet (1.25 mg total) by mouth daily for 14 days. 03/28/19 04/11/19  Vena Austria, MD  ibuprofen (ADVIL) 600 MG tablet Take 1 tablet (600 mg total) by mouth every 6 (six) hours as needed. 03/28/19   Vena Austria, MD  medroxyPROGESTERone (PROVERA) 10 MG tablet Take 1 tablet (10 mg total) by mouth daily for 10 days. Start  after first 14 days of premarin 03/28/19 04/29/19  Vena Austria, MD  meloxicam (MOBIC) 15 MG tablet Take 1 tablet (15 mg total) by mouth daily as needed for pain. Patient not taking: Reported on 05/14/2019 03/25/19   Tommie Sams, DO  pantoprazole (PROTONIX) 20 MG tablet Take 20 mg by mouth daily as needed for heartburn.     [provider]  Probiotic CAPS Take 1 capsule by mouth daily.    [provider]    Physical Exam Vitals:  Vitals:   06/05/19 1031  BP: 127/79   No LMP recorded. (Menstrual status: Irregular Periods).  General: NAD HEENT: normocephalic, anicteric Thyroid: no enlargement, no palpable nodules Pulmonary: No increased work of breathing Cardiovascular: RRR, distal pulses 2+ Abdomen: NABS, soft, non-tender, non-distended.  Umbilicus without lesions.  No hepatomegaly, splenomegaly or masses palpable. No evidence of hernia  Genitourinary:  External: Normal external female genitalia.  Normal urethral meatus, normal  Bartholin's and Skene's glands.    Vagina: Normal vaginal mucosa, no evidence of prolapse.    Cervix: Grossly normal in appearance, no bleeding  Uterus: Non-enlarged, mobile, normal contour.  No CMT  Adnexa: ovaries non-enlarged, no adnexal masses  Rectal: deferred  Lymphatic: no evidence of inguinal lymphadenopathy Extremities: no edema, erythema, or tenderness Neurologic: Grossly intact Psychiatric: mood appropriate, affect full  Female chaperone present for pelvic  portions of the physical exam  Assessment: 28 y.o. [id number] follow up for what appear to be likely hydradenitis   Plan: Problem List Items Addressed This Visit    None    Visit Diagnoses    Hydradenitis    -  Primary   Relevant Orders   Ambulatory referral to Dermatology   Secondary amenorrhea         1) Secondary Amenorrhea - Had a cycle since last visit, was dark brown blood and shorter than usual. - monitor to see if cycles resume normally, keep REI  appointment January if no normal cycle in December  2) Boil seen last time improved/resolved but new boils have popped up.  Most consistent with hydradenitis will arrange dermatology referral .   3) A total of 15 minutes were spent in face-to-face contact with the patient during this encounter with over half of that time devoted to counseling and coordination of care.  4) Return for 3 months follow up cycles.   Vena Austria, MD, [facility], [facility]

## 2019-06-09 ENCOUNTER — Other Ambulatory Visit: Payer: Self-pay | Admitting: Obstetrics and Gynecology

## 2019-06-09 NOTE — Telephone Encounter (Signed)
Please advise 

## 2019-06-10 ENCOUNTER — Other Ambulatory Visit: Payer: Self-pay | Admitting: Obstetrics and Gynecology

## 2019-06-11 ENCOUNTER — Other Ambulatory Visit: Payer: Self-pay | Admitting: Obstetrics and Gynecology

## 2019-06-11 MED ORDER — TERCONAZOLE 0.4 % VA CREA: 1.0000 | TOPICAL_CREAM | Freq: Every day | VAGINAL | 1 refills | Status: DC

## 2019-06-12 ENCOUNTER — Telehealth: Payer: Self-pay | Admitting: Obstetrics and Gynecology

## 2019-06-12 NOTE — Telephone Encounter (Signed)
Called and left message for patient to contact [facility] for her referral. [phone].

## 2019-06-15 ENCOUNTER — Other Ambulatory Visit: Payer: Self-pay | Admitting: Obstetrics and Gynecology

## 2019-06-15 MED ORDER — SULFAMETHOXAZOLE-TRIMETHOPRIM 800-160 MG PO TABS: 1.0000 | ORAL_TABLET | Freq: Two times a day (BID) | ORAL | 1 refills | Status: AC

## 2019-06-17 DIAGNOSIS — L732 Hidradenitis suppurativa: Secondary | ICD-10-CM | POA: Diagnosis not present

## 2019-07-15 ENCOUNTER — Ambulatory Visit
Admission: EM | Admit: 2019-07-15 | Discharge: 2019-07-15 | Disposition: A | Discharge status: Home / Self Care | Payer: Self-pay | Attending: Family Medicine | Admitting: Family Medicine

## 2019-07-15 ENCOUNTER — Other Ambulatory Visit: Payer: Self-pay

## 2019-07-15 DIAGNOSIS — K0889 Other specified disorders of teeth and supporting structures: Secondary | ICD-10-CM

## 2019-07-15 DIAGNOSIS — H6506 Acute serous otitis media, recurrent, bilateral: Secondary | ICD-10-CM

## 2019-07-15 DIAGNOSIS — H6503 Acute serous otitis media, bilateral: Secondary | ICD-10-CM

## 2019-07-15 MED ORDER — FLUCONAZOLE 150 MG PO TABS: 150.0000 mg | ORAL_TABLET | Freq: Every day | ORAL | 0 refills | Status: DC

## 2019-07-15 MED ORDER — LIDOCAINE VISCOUS HCL 2 % MT SOLN: 10.0000 mL | Freq: Four times a day (QID) | OROMUCOSAL | 0 refills | Status: DC | PRN

## 2019-07-15 MED ORDER — AMOXICILLIN 875 MG PO TABS: 875.0000 mg | ORAL_TABLET | Freq: Two times a day (BID) | ORAL | 0 refills | Status: DC

## 2019-07-15 NOTE — ED Triage Notes (Signed)
Pt presents with c/o right ear pain. She reports some swelling to that side of her face. She denies any drainage from the ear or any decrease in hearing. She also denies fever, sore throat or nasal/sinus congestion.

## 2019-07-15 NOTE — ED Provider Notes (Signed)
[facility] ____________________________________________  Time seen: Approximately 9:21 AM  I have reviewed the triage vital signs and the nursing notes.   HISTORY  Chief Complaint Otalgia (right)   HPI Alexis Berg is a 29 y.o. female presenting for evaluation of right ear discomfort.  Reports is been gradual onset over the last several days.  States also some pain to right lower teeth, in which she reports she has multiple broken teeth that she is going to see the dentist about next month.  Denies recent cough, congestion, sore throat or fevers.  Denies any atypical shortness of breath or chest pain.  Continues to overall eat and drink well.  States her ears have been bothering her more at work and she has to wear earplugs when at work.  Has been rinsing her mouth with peroxide, denies other aggravating alleviating factors.  Reports otherwise doing well.  Department, [facility] : PCP    Past Medical History:  Diagnosis Date  . Anxiety   . GERD (gastroesophageal reflux disease)   . Heartburn   . Morbid obesity (HCC)   . MRSA (methicillin resistant staph aureus) culture positive    2013/2014-on right leg/ thigh  . Pruritus     Patient Active Problem List   Diagnosis Date Noted  . Anxiety 11/09/2018  . Heartburn 11/09/2018  . Morbid obesity (HCC)   . MRSA (methicillin resistant staph aureus) culture positive     Past Surgical History:  Procedure Laterality Date  . DILATION AND CURETTAGE OF UTERUS    . DILATION AND CURETTAGE OF UTERUS N/A 12/10/2018   Procedure: DILATATION AND CURETTAGE SUCTION;  Surgeon: Vena Austria, MD;  Location: [facility] ORS;  Service: Gynecology;  Laterality: N/A;  . INCISION AND DRAINAGE       No current facility-administered medications for this encounter.  Current Outpatient Medications:  .  clotrimazole (LOTRIMIN) 1 % cream, Apply 1 application topically 2 (two) times daily., Disp: 30 g, Rfl: 3 .  ibuprofen  (ADVIL) 600 MG tablet, Take 1 tablet (600 mg total) by mouth every 6 (six) hours as needed., Disp: 60 tablet, Rfl: 3 .  pantoprazole (PROTONIX) 20 MG tablet, Take 20 mg by mouth daily as needed for heartburn. , Disp: , Rfl:  .  Probiotic CAPS, Take 1 capsule by mouth daily., Disp: , Rfl:  .  amoxicillin (AMOXIL) 875 MG tablet, Take 1 tablet (875 mg total) by mouth 2 (two) times daily., Disp: 20 tablet, Rfl: 0 .  lidocaine (XYLOCAINE) 2 % solution, Use as directed 10 mLs in the mouth or throat every 6 (six) hours as needed for mouth pain., Disp: 100 mL, Rfl: 0  Allergies Patient has no known allergies.  Family History  Problem Relation Age of Onset  . Breast cancer Mother 25  . Diabetes Mother   . Thyroid disease Mother   . Heart attack Father 49  . Parkinson's disease Father   . Sickle cell anemia Nephew        brother's son    Social History Social History   Tobacco Use  . Smoking status: Former Smoker    Packs/day: 0.25    Years: 5.00    Pack years: 1.25    Types: Cigarettes    Quit date: 08/13/2016    Years since quitting: 2.9  . Smokeless tobacco: Never Used  Substance Use Topics  . Alcohol use: Not Currently    Comment: last alcohol 20 April-2 beer  . Drug use: No  Review of Systems Constitutional: No fever ENT: No sore throat. As above.  Cardiovascular: Denies chest pain. Respiratory: Denies shortness of breath. Gastrointestinal: No abdominal pain.  No nausea, no vomiting.  No diarrhea.  Musculoskeletal: Negative for back pain. Skin: Negative for rash.  ____________________________________________   PHYSICAL EXAM:  VITAL SIGNS: ED Triage Vitals  Enc Vitals Group     BP 07/15/19 0845 109/69     Pulse Rate 07/15/19 0845 98     Resp --      Temp 07/15/19 0845 98.6 F (37 C)     Temp Source 07/15/19 0845 Oral     SpO2 07/15/19 0845 100 %     Weight 07/15/19 0842 200 lb (90.7 kg)     Height 07/15/19 0842 5' (1.524 m)     Head Circumference --       Peak Flow --      Pain Score 07/15/19 0842 9     Pain Loc --      Pain Edu? --      Excl. in GC? --     Constitutional: Alert and oriented. Well appearing and in no acute distress. Eyes: Conjunctivae are normal.  Head: Atraumatic. No sinus tenderness to palpation. No swelling. No erythema.  Ears: Left: Nontender, normal canal, mild erythema, mild effusion, otherwise normal TM.  Right: Nontender, normal canal, mild erythema, mild effusion, otherwise normal TM.  Nose:No nasal congestion   Mouth/Throat: Mucous membranes are moist. No pharyngeal erythema. No tonsillar swelling or exudate.  Multiple dental fractures and decay.  Right lower dental fractures posterior molars with mild gumline erythema and edema, no palpated abscess, tenderness along gumline. Neck: No stridor.  No cervical spine tenderness to palpation. Hematological/Lymphatic/Immunilogical: No cervical lymphadenopathy. Cardiovascular: Normal rate, regular rhythm. Grossly normal heart sounds.  Good peripheral circulation. Respiratory: Normal respiratory effort.  No retractions. No wheezes, rales or rhonchi. Good air movement.  Musculoskeletal: Ambulatory with steady gait. Neurologic:  Normal speech and language. No gait instability. Skin:  Skin appears warm, dry and intact. No rash noted. Psychiatric: Mood and affect are normal. Speech and behavior are normal.  ___________________________________________   LABS (all labs ordered are listed, but only abnormal results are displayed)  Labs Reviewed - No data to display   PROCEDURES Procedures   INITIAL IMPRESSION / ASSESSMENT AND PLAN / ED COURSE  Pertinent labs & imaging results that were available during my care of the patient were reviewed by me and considered in my medical decision making (see chart for details).  Well-appearing patient.  No acute distress.  Bilateral serous otitis, recommend over-the-counter Claritin or Zyrtec.  Patient also with concern for dental  infection.  Will treat with oral amoxicillin..  Viscous lidocaine.  Over-the-counter Tylenol ibuprofen.  Follow-up with dentist.  Patient requests Rx Diflucan as needed, Rx given. Discussed indication, risks and benefits of medications with patient.  Discussed follow up and return parameters including no resolution or any worsening concerns. Patient verbalized understanding and agreed to plan.   ____________________________________________   FINAL CLINICAL IMPRESSION(S) / ED DIAGNOSES  Final diagnoses:  Pain, dental  Bilateral acute serous otitis media, recurrence not specified     ED Discharge Orders         Ordered    amoxicillin (AMOXIL) 875 MG tablet  2 times daily     07/15/19 0919    lidocaine (XYLOCAINE) 2 % solution  Every 6 hours PRN     07/15/19 0919  Note: This dictation was prepared with Dragon dictation along with smaller phrase technology. Any transcriptional errors that result from this process are unintentional.         Marylene Land, NP 07/15/19 [phone]

## 2019-07-15 NOTE — Discharge Instructions (Signed)
Take medication as prescribed. Rest. Drink plenty of fluids. Over the counter claritin or zyrtec. ° °Follow up with your primary care physician this week as needed. Return to Urgent care for new or worsening concerns.  ° °

## 2019-07-22 ENCOUNTER — Other Ambulatory Visit: Payer: Self-pay | Admitting: Obstetrics and Gynecology

## 2019-07-22 MED ORDER — ESCITALOPRAM OXALATE 10 MG PO TABS: 10.0000 mg | ORAL_TABLET | Freq: Every day | ORAL | 3 refills | Status: DC

## 2019-07-22 NOTE — Telephone Encounter (Signed)
Patient is schedule 08/08/19 with AMS via telephone

## 2019-07-22 NOTE — Telephone Encounter (Signed)
Needs medication follow up 2 weeks telephone or video visit

## 2019-07-22 NOTE — Progress Notes (Signed)
GAD-7 is 14, PHQ-9 is 12 start lexapro 10mg  po daily

## 2019-08-08 ENCOUNTER — Other Ambulatory Visit: Payer: Self-pay

## 2019-08-08 ENCOUNTER — Ambulatory Visit: Payer: Medicaid Other | Admitting: Obstetrics and Gynecology

## 2019-09-04 ENCOUNTER — Other Ambulatory Visit: Payer: Self-pay

## 2019-09-04 ENCOUNTER — Encounter: Payer: Self-pay | Admitting: Obstetrics and Gynecology

## 2019-09-04 ENCOUNTER — Ambulatory Visit (INDEPENDENT_AMBULATORY_CARE_PROVIDER_SITE_OTHER): Payer: Self-pay | Admitting: Obstetrics and Gynecology

## 2019-09-04 VITALS — BP 131/73 | HR 101 | Wt 214.0 lb

## 2019-09-04 DIAGNOSIS — F419 Anxiety disorder, unspecified: Secondary | ICD-10-CM

## 2019-09-04 DIAGNOSIS — F329 Major depressive disorder, single episode, unspecified: Secondary | ICD-10-CM

## 2019-09-04 DIAGNOSIS — N911 Secondary amenorrhea: Secondary | ICD-10-CM

## 2019-09-04 DIAGNOSIS — F32A Depression, unspecified: Secondary | ICD-10-CM

## 2019-09-04 MED ORDER — ESCITALOPRAM OXALATE 10 MG PO TABS: 10.0000 mg | ORAL_TABLET | Freq: Every day | ORAL | 3 refills | Status: DC

## 2019-09-04 NOTE — Progress Notes (Signed)
Obstetrics & Gynecology Office Visit   Chief Complaint:  Chief Complaint  Patient presents with  . Follow-up    irregular cycles    History of Present Illness: 29 y.o. [id number] presenting for medication follow up and discussion of additional management option for a diagnosis of secondary amenorrhea, anxiety and depression.  She is currently being managed with lexapro 10mg  daily.   The patient reports good control of symptoms on her current regimen.  Menstrual cycles have remained regular monthly, normal flow, + moliminal symptoms.   She has not noted any side-effects or new symptoms.    Review of Systems: Review of Systems  Constitutional: Negative.   Gastrointestinal: Negative for nausea.  Genitourinary: Negative.   Neurological: Negative for headaches.  Psychiatric/Behavioral: Negative.     Past Medical History:  Past Medical History:  Diagnosis Date  . Anxiety   . GERD (gastroesophageal reflux disease)   . Heartburn   . Morbid obesity (HCC)   . MRSA (methicillin resistant staph aureus) culture positive    2013/2014-on right leg/ thigh  . Pruritus     Past Surgical History:  Past Surgical History:  Procedure Laterality Date  . DILATION AND CURETTAGE OF UTERUS    . DILATION AND CURETTAGE OF UTERUS N/A 12/10/2018   Procedure: DILATATION AND CURETTAGE SUCTION;  Surgeon: 02/09/2019, MD;  Location: [facility] ORS;  Service: Gynecology;  Laterality: N/A;  . INCISION AND DRAINAGE      Gynecologic History: Patient's last menstrual period was 08/10/2019.  Obstetric History: 10/08/2019  Family History:  Family History  Problem Relation Age of Onset  . Breast cancer Mother 5  . Diabetes Mother   . Thyroid disease Mother   . Heart attack Father 15  . Parkinson's disease Father   . Sickle cell anemia Nephew        brother's son    Social History:  Social History   Socioeconomic History  . Marital status: Single    Spouse name: Not on file  . Number of children:  3  . Years of education: Not on file  . Highest education level: Not on file  Occupational History  . Not on file  Tobacco Use  . Smoking status: Former Smoker    Packs/day: 0.25    Years: 5.00    Pack years: 1.25    Types: Cigarettes    Quit date: 08/13/2016    Years since quitting: 3.0  . Smokeless tobacco: Never Used  Substance and Sexual Activity  . Alcohol use: Not Currently    Comment: last alcohol 20 April-2 beer  . Drug use: No  . Sexual activity: Yes    Partners: Male  Other Topics Concern  . Not on file  Social History Narrative  . Not on file   Social Determinants of Health   Financial Resource Strain:   . Difficulty of Paying Living Expenses: Not on file  Food Insecurity:   . Worried About 10/11/2016 in the Last Year: Not on file  . Ran Out of Food in the Last Year: Not on file  Transportation Needs:   . Lack of Transportation (Medical): Not on file  . Lack of Transportation (Non-Medical): Not on file  Physical Activity:   . Days of Exercise per Week: Not on file  . Minutes of Exercise per Session: Not on file  Stress:   . Feeling of Stress : Not on file  Social Connections:   . Frequency of Communication with  Friends and Family: Not on file  . Frequency of Social Gatherings with Friends and Family: Not on file  . Attends Religious Services: Not on file  . Active Member of Clubs or Organizations: Not on file  . Attends Archivist Meetings: Not on file  . Marital Status: Not on file  Intimate Partner Violence:   . Fear of Current or Ex-Partner: Not on file  . Emotionally Abused: Not on file  . Physically Abused: Not on file  . Sexually Abused: Not on file    Allergies:  No Known Allergies  Medications: Prior to Admission medications   Medication Sig Start Date End Date Taking? Authorizing Provider  amoxicillin (AMOXIL) 875 MG tablet Take 1 tablet (875 mg total) by mouth 2 (two) times daily. 07/15/19   Marylene Land, NP    clotrimazole (LOTRIMIN) 1 % cream Apply 1 application topically 2 (two) times daily. 12/08/18   Cuthriell, Charline Bills, PA-C  escitalopram (LEXAPRO) 10 MG tablet Take 1 tablet (10 mg total) by mouth daily. 09/04/19   Malachy Mood, MD  fluconazole (DIFLUCAN) 150 MG tablet Take 1 tablet (150 mg total) by mouth daily. Take one pill orally as needed. 07/15/19   Marylene Land, NP  ibuprofen (ADVIL) 600 MG tablet Take 1 tablet (600 mg total) by mouth every 6 (six) hours as needed. 03/28/19   Malachy Mood, MD  lidocaine (XYLOCAINE) 2 % solution Use as directed 10 mLs in the mouth or throat every 6 (six) hours as needed for mouth pain. 07/15/19   Marylene Land, NP  pantoprazole (PROTONIX) 20 MG tablet Take 20 mg by mouth daily as needed for heartburn.     [provider]  Probiotic CAPS Take 1 capsule by mouth daily.    [provider]  estrogens, conjugated, (PREMARIN) 1.25 MG tablet Take 1 tablet (1.25 mg total) by mouth daily for 14 days. 03/28/19 07/15/19  Malachy Mood, MD  medroxyPROGESTERone (PROVERA) 10 MG tablet Take 1 tablet (10 mg total) by mouth daily for 10 days. Start after first 14 days of premarin 03/28/19 07/15/19  Malachy Mood, MD    Physical Exam Vitals:  Vitals:   09/04/19 1019  BP: 131/73  Pulse: (!) 101   Patient's last menstrual period was 08/10/2019.  General: NAD, well nourished, appears stated age 10: normocephalic, anicteric Pulmonary: No increased work of breathing Neurologic: Grossly intact Psychiatric: mood appropriate, affect full  Female chaperone present for pelvic  portions of the physical exam  Assessment: 29 y.o. [id number] follow up anxiety/depression, secondary amenorrhea  Plan: Problem List Items Addressed This Visit    None    Visit Diagnoses    Secondary amenorrhea    -  Primary   Anxiety and depression       Relevant Medications   escitalopram (LEXAPRO) 10 MG tablet     1) Secondary amenorrhea - menses have  remained regular monthly.  - Discussed TLC care with next pregnancy - Discussed OPK  And day 21 - - - Discussed checking pogesterone level to ensure ovulatory cycles  2) Anxiety/Depression - Doing well with lexapro continue present dose  3) A total of 15 minutes were spent in face-to-face contact with the patient during this encounter with over half of that time devoted to counseling and coordination of care.   4) Return in about 1 year (around 09/03/2020) for annual.    Malachy Mood, MD, [place], [place]

## 2019-10-09 ENCOUNTER — Telehealth: Payer: Medicaid Other | Admitting: Physician Assistant

## 2019-10-09 DIAGNOSIS — R300 Dysuria: Secondary | ICD-10-CM

## 2019-10-09 MED ORDER — CEPHALEXIN 500 MG PO CAPS: 500.0000 mg | ORAL_CAPSULE | Freq: Two times a day (BID) | ORAL | 0 refills | Status: AC

## 2019-10-09 NOTE — Progress Notes (Signed)

## 2019-10-16 ENCOUNTER — Other Ambulatory Visit: Payer: Self-pay | Admitting: Obstetrics and Gynecology

## 2019-10-16 DIAGNOSIS — B373 Candidiasis of vulva and vagina: Secondary | ICD-10-CM

## 2019-10-16 DIAGNOSIS — B3731 Acute candidiasis of vulva and vagina: Secondary | ICD-10-CM

## 2019-10-16 MED ORDER — FLUCONAZOLE 150 MG PO TABS: 150.0000 mg | ORAL_TABLET | Freq: Once | ORAL | 0 refills | Status: AC

## 2019-10-16 NOTE — Telephone Encounter (Signed)
Rx sent to pharmacy of record. 

## 2019-10-17 ENCOUNTER — Other Ambulatory Visit: Payer: Self-pay | Admitting: Obstetrics and Gynecology

## 2019-10-18 ENCOUNTER — Other Ambulatory Visit: Payer: Self-pay | Admitting: Obstetrics and Gynecology

## 2019-10-18 MED ORDER — FLUCONAZOLE 150 MG PO TABS: 150.0000 mg | ORAL_TABLET | Freq: Every day | ORAL | 0 refills | Status: DC | PRN

## 2019-10-19 ENCOUNTER — Other Ambulatory Visit: Payer: Self-pay

## 2019-10-19 ENCOUNTER — Ambulatory Visit
Admission: EM | Admit: 2019-10-19 | Discharge: 2019-10-19 | Disposition: A | Discharge status: Home / Self Care | Payer: Medicaid Other | Attending: Urgent Care | Admitting: Urgent Care

## 2019-10-19 ENCOUNTER — Encounter: Payer: Self-pay | Admitting: Emergency Medicine

## 2019-10-19 DIAGNOSIS — Z113 Encounter for screening for infections with a predominantly sexual mode of transmission: Secondary | ICD-10-CM

## 2019-10-19 DIAGNOSIS — R102 Pelvic and perineal pain: Secondary | ICD-10-CM | POA: Insufficient documentation

## 2019-10-19 LAB — CHLAMYDIA/NGC RT PCR (ARMC ONLY)
Chlamydia Tr: NOT DETECTED
N gonorrhoeae: NOT DETECTED

## 2019-10-19 LAB — URINALYSIS, COMPLETE (UACMP) WITH MICROSCOPIC
Bilirubin Urine: NEGATIVE
Glucose, UA: NEGATIVE mg/dL
Ketones, ur: NEGATIVE mg/dL
Leukocytes,Ua: NEGATIVE
Nitrite: NEGATIVE
RBC / HPF: >50 RBC/hpf (ref 0–5)
Specific Gravity, Urine: 1.025 (ref 1.005–1.030)
pH: 5.5 (ref 5.0–8.0)

## 2019-10-19 LAB — WET PREP, GENITAL
Clue Cells Wet Prep HPF POC: NONE SEEN
Sperm: NONE SEEN
Trich, Wet Prep: NONE SEEN
Yeast Wet Prep HPF POC: NONE SEEN

## 2019-10-19 LAB — PREGNANCY, URINE: Preg Test, Ur: NEGATIVE

## 2019-10-19 NOTE — ED Triage Notes (Signed)
Pt c/o vaginal pain. Started yesterday. She states she had a normal period on 10/04/19 and she started bleeding this morning. She denies dysuria or abdominal pain.

## 2019-10-19 NOTE — Discharge Instructions (Signed)
It was very nice seeing you today in clinic. Thank you for entrusting me with your care.   STD testing pending. Everything else has been negative.   Make arrangements to follow up with Dr. Bonney Aid for further evaluation. If your symptoms/condition worsens, please seek follow up care either here or in the ER. Please remember, our [facility] providers are "right here with you" when you need [place].   Again, it was my pleasure to take care of you today. Thank you for choosing our clinic. I hope that you start to feel better quickly.   Quentin Mulling, MSN, APRN, FNP-C, CEN Advanced Practice Provider [facility]

## 2019-10-21 ENCOUNTER — Ambulatory Visit (INDEPENDENT_AMBULATORY_CARE_PROVIDER_SITE_OTHER): Payer: Self-pay | Admitting: Obstetrics and Gynecology

## 2019-10-21 ENCOUNTER — Encounter: Payer: Self-pay | Admitting: Obstetrics and Gynecology

## 2019-10-21 ENCOUNTER — Other Ambulatory Visit: Payer: Self-pay

## 2019-10-21 VITALS — BP 122/74 | Ht 60.0 in | Wt 215.0 lb

## 2019-10-21 DIAGNOSIS — N923 Ovulation bleeding: Secondary | ICD-10-CM

## 2019-10-21 LAB — URINE CULTURE

## 2019-10-21 NOTE — ED Provider Notes (Signed)
[place], [place]   Name: Alexis Berg DOB: 1991-05-29 MRN: [id number] CSN: [id number] PCP: [facility], [place] date and time:  10/19/19 1104  Chief Complaint:  Vaginal Pain  NOTE: Prior to seeing the patient today, I have reviewed the triage nursing documentation and vital signs. Clinical staff has updated patient's PMH/PSHx, current medication list, and drug allergies/intolerances to ensure comprehensive history available to assist in medical decision making.   History:   HPI: Alexis Berg is a 29 y.o. female who presents today with complaints of vaginal pain that started yesterday. Patient describes the pain as a "throbbing pressure".  She denies external irritation. Patient denies any vaginal bleeding or discharge. She has not had any nausea, vomiting, bowel changes, or abdominal pain. Patient's last menstrual period was 10/04/2019 (approximate). She has no concerns about pregnant. Patient reports that she was recently treated with antibiotics for a UTI, however she is unable to recall what she took.  Patient has been in contact with her GYN office to make them aware of her current complaints.  A prescription for oral Diflucan has been called and, however patient notes that she has not took this prescription yet.  Patient endorses unprotected sexual activity, however notes that she is not concerned about STIs.  In efforts to conservatively manage her symptoms at home, patient has been taking ibuprofen.  She notes that this intervention has been not effective in helping with her pain.  Past Medical History:  Diagnosis Date  . Anxiety   . GERD (gastroesophageal reflux disease)   . Heartburn   . Morbid obesity ([place])   . MRSA (methicillin resistant staph aureus) culture positive    2013/2014-on right leg/ thigh  . Pruritus     Past Surgical History:  Procedure Laterality Date  . DILATION AND CURETTAGE OF UTERUS    . DILATION AND CURETTAGE OF UTERUS N/A  12/10/2018   Procedure: DILATATION AND CURETTAGE SUCTION;  Surgeon: Malachy Mood, MD;  Location: [facility] ORS;  Service: Gynecology;  Laterality: N/A;  . INCISION AND DRAINAGE      Family History  Problem Relation Age of Onset  . Breast cancer Mother 83  . Diabetes Mother   . Thyroid disease Mother   . Heart attack Father 32  . Parkinson's disease Father   . Sickle cell anemia Nephew        brother's son    Social History   Tobacco Use  . Smoking status: Former Smoker    Packs/day: 0.25    Years: 5.00    Pack years: 1.25    Types: Cigarettes    Quit date: 08/13/2016    Years since quitting: 3.1  . Smokeless tobacco: Never Used  Substance Use Topics  . Alcohol use: Not Currently    Comment: last alcohol 20 April-2 beer  . Drug use: No    Patient Active Problem List   Diagnosis Date Noted  . Anxiety 11/09/2018  . Heartburn 11/09/2018  . Morbid obesity ([place])   . MRSA (methicillin resistant staph aureus) culture positive     Home Medications:    Current Meds  Medication Sig  . escitalopram (LEXAPRO) 10 MG tablet Take 1 tablet (10 mg total) by mouth daily.  Marland Kitchen ibuprofen (ADVIL) 600 MG tablet Take 1 tablet (600 mg total) by mouth every 6 (six) hours as needed.  . pantoprazole (PROTONIX) 20 MG tablet Take 20 mg by mouth daily as needed for heartburn.   . Probiotic CAPS Take 1 capsule by  mouth daily.    Allergies:   Patient has no known allergies.  Review of Systems (ROS):  Review of systems NEGATIVE unless otherwise noted in narrative H&P section.   Vital Signs: Today's Vitals   10/19/19 1133 10/19/19 1134 10/19/19 1138 10/19/19 1235  BP:   115/81   Pulse:   85   Resp:   18   Temp:   98.3 F (36.8 C)   TempSrc:   Oral   SpO2:   100%   Weight:  210 lb (95.3 kg)    Height:  5' (1.524 m)    PainSc: 9    9     Physical Exam: Physical Exam  Constitutional: She is oriented to person, place, and time and well-developed, well-nourished, and in no distress.    HENT:  Head: Normocephalic and atraumatic.  Eyes: Pupils are equal, round, and reactive to light.  Cardiovascular: Normal rate, regular rhythm, normal heart sounds and intact distal pulses.  Pulmonary/Chest: Effort normal and breath sounds normal.  Abdominal: Soft. Normal appearance and bowel sounds are normal. She exhibits no distension. There is no abdominal tenderness. There is no CVA tenderness.  Genitourinary:    Genitourinary Comments: Exam deferred. No vaginal/pelvic pain or bleeding. Patient is not currently pregnant. She has elected to self collect specimen swab for wet prep and DNA probe for GC.   Neurological: She is alert and oriented to person, place, and time. Gait normal.  Skin: Skin is warm and dry. No rash noted. She is not diaphoretic.  Psychiatric: Memory, affect and judgment normal. Her mood appears anxious.  Nursing note and vitals reviewed.   Urgent Care Treatments / Results:   Orders Placed This Encounter  Procedures  . Chlamydia/NGC rt PCR ([facility] only)  . Wet prep, genital  . Urine culture  . Urinalysis, Complete w Microscopic  . Pregnancy, urine    LABS: PLEASE NOTE: all labs that were ordered this encounter are listed, however only abnormal results are displayed. Labs Reviewed  WET PREP, GENITAL - Abnormal; Notable for the following components:      Result Value   WBC, Wet Prep HPF POC FEW (*)    All other components within normal limits  URINE CULTURE - Abnormal; Notable for the following components:   Culture MULTIPLE SPECIES PRESENT, SUGGEST RECOLLECTION (*)    All other components within normal limits  URINALYSIS, COMPLETE (UACMP) WITH MICROSCOPIC - Abnormal; Notable for the following components:   Color, Urine AMBER (*)    APPearance CLOUDY (*)    Hgb urine dipstick MODERATE (*)    Protein, ur TRACE (*)    Bacteria, UA FEW (*)    All other components within normal limits  CHLAMYDIA/NGC RT PCR ([facility] ONLY)  PREGNANCY, URINE     EKG: -None  RADIOLOGY: No results found.  PROCEDURES: Procedures  MEDICATIONS RECEIVED THIS VISIT: Medications - No data to display  PERTINENT CLINICAL COURSE NOTES/UPDATES:   Initial Impression / Assessment and Plan / Urgent Care Course:  Pertinent labs & imaging results that were available during my care of the patient were personally reviewed by me and considered in my medical decision making (see lab/imaging section of note for values and interpretations).  Alexis Berg is a 29 y.o. female who presents to [facility] today with complaints of Vaginal Pain  Patient is well appearing overall in clinic today. She does not appear to be in any acute distress. Presenting symptoms (see HPI) and exam as documented above.  Patient  presents with symptoms that started with acute onset yesterday.  She denies any nausea, vomiting, diarrhea, or fevers.  She is not experiencing any abdominal pain, urinary symptoms, or vaginal discharge.  Patient recently treated for UTI.  Will pursue work-up as follows:   UA today negative for infection; 6-10 WBC/hpf, >50 RBC/hpf, and few bacteria; no nitrites or LE. Will send reflex C*S.    Urine hCG -) for pregnancy.    Wet prep swab did not reveal any candida, clue cells, or trichomonas.   STI testing discussed with patient. She was advised that testing would be performed vaginal swabs. Discussed that testing will result over the course of the next few days. Patient asked to abstain for sexual activity until all testing has result as negative. She was advised that she would be contacted with any POSITIVE results only and that all results could be view on myChart. Unless there is a change in the plan of care that deviates from what we discussed today in clinic, patient will not be contacted (negative results). In the event that there any positive results, patient encouraged to notify all of her sexual partners so that they may have the chance  to get tested and be treated to prevent further transmission to others.She verbalizes understanding and wishes to proceed.   DNA probe for GC self collected by patient and sent for testing.  Results from work-up reviewed with patient.  There is no clear etiology of her vaginal pain at this time.  Patient was encouraged to fill the prescription for the oral fluconazole as prescribed by GYN.  Discussed that there is the potential that Candida did not show up on swab that she collected.  Patient advised to increase fluid intake.  Patient advised to contact GYN office tomorrow to discuss being seen for further specialist evaluation. I have reviewed the follow up and strict return precautions for any new or worsening symptoms. Patient is aware of symptoms that would be deemed urgent/emergent, and would thus require further evaluation either here or in the emergency department. At the time of discharge, she verbalized understanding and consent with the discharge plan as it was reviewed with her. All questions were fielded by provider and/or clinic staff prior to patient discharge.    Final Clinical Impressions / Urgent Care Diagnoses:   Final diagnoses:  Vaginal pain  Screen for STD (sexually transmitted disease)    New Prescriptions:  [place] Controlled Substance Registry consulted? Not Applicable  No orders of the defined types were placed in this encounter.   Recommended Follow up Care:  Patient encouraged to follow up with the following provider within the specified time frame, or sooner as dictated by the severity of her symptoms. As always, she was instructed that for any urgent/emergent care needs, she should seek care either here or in the emergency department for more immediate evaluation.  Follow-up Information    Department, [facility] In 1 week.   Why: General reassessment of symptoms if not improving Contact information: [address] [place]  [address] [phone]         NOTE: This note was prepared using Dragon dictation software along with smaller phrase technology. Despite my best ability to proofread, there is the potential that transcriptional errors may still occur from this process, and are completely unintentional.     Verlee Monte, NP 10/21/19 [phone]

## 2019-10-21 NOTE — Progress Notes (Signed)
Obstetrics & Gynecology Office Visit   Chief Complaint  Patient presents with  . Menstrual Problem   History of Present Illness: 29 y.o. [id number] female  Saturday evening (3 days ago) she started having pain (vaginal). Sunday morning when she woke up she was having bleeding. So, she went to Urgent Care. She had her last period 4/3-4/4.  She had some testing done at Urgent Care and everything was negative.  She is still cramping and bleeding.  This bleeding is dark and without clots.  The bleeding is not heavy. She has to change a pad 1-2 times per day.  She was given no answer as to what was going on and was instructed to follow up.  She is having spontaneous menses and these have been regular for a few months. She previously went six months with no menses.  She was given medication to force a period and then she started having regular periods.  The other difference with this bleeding is that normally her breasts would hurt just before her period starts.  Her more recent period her breasts were tender the entire time. The soreness of her breasts was over after her period stopped.  She denies vaginal symptoms of itching, burning, irritation. She has no urinary symptoms. She denies bowel symptoms apart from feeling like she has gas. She has not changed medications any time recently.  Last pap smear was 07/17/2018.  She has no new stressors. Over the past few months she has lost 30 pounds.  She denies fevers and chills.    Work up at Urgent Care UPT: negative Urine culture: inconclusive. UA: not suggestive of infection GC/Chlamydia: negative  Past Medical History:  Diagnosis Date  . Anxiety   . GERD (gastroesophageal reflux disease)   . Heartburn   . Morbid obesity ([facility])   . MRSA (methicillin resistant staph aureus) culture positive    2013/2014-on right leg/ thigh  . Pruritus     Past Surgical History:  Procedure Laterality Date  . DILATION AND CURETTAGE OF UTERUS    . DILATION AND  CURETTAGE OF UTERUS N/A 12/10/2018   Procedure: DILATATION AND CURETTAGE SUCTION;  Surgeon: Vena Austria, MD;  Location: [facility] ORS;  Service: Gynecology;  Laterality: N/A;  . INCISION AND DRAINAGE      Gynecologic History: Patient's last menstrual period was 10/04/2019 (approximate).  Obstetric History: [id number]  Family History  Problem Relation Age of Onset  . Breast cancer Mother 65  . Diabetes Mother   . Thyroid disease Mother   . Heart attack Father 40  . Parkinson's disease Father   . Sickle cell anemia Nephew        brother's son    Social History   Socioeconomic History  . Marital status: Single    Spouse name: Not on file  . Number of children: 3  . Years of education: Not on file  . Highest education level: Not on file  Occupational History  . Not on file  Tobacco Use  . Smoking status: Former Smoker    Packs/day: 0.25    Years: 5.00    Pack years: 1.25    Types: Cigarettes    Quit date: 08/13/2016    Years since quitting: 3.1  . Smokeless tobacco: Never Used  Substance and Sexual Activity  . Alcohol use: Not Currently    Comment: last alcohol 20 April-2 beer  . Drug use: No  . Sexual activity: Yes    Partners: Male  Other Topics Concern  .  Not on file  Social History Narrative  . Not on file   Social Determinants of Health   Financial Resource Strain:   . Difficulty of Paying Living Expenses:   Food Insecurity:   . Worried About Programme researcher, broadcasting/film/video in the Last Year:   . Barista in the Last Year:   Transportation Needs:   . Freight forwarder (Medical):   Marland Kitchen Lack of Transportation (Non-Medical):   Physical Activity:   . Days of Exercise per Week:   . Minutes of Exercise per Session:   Stress:   . Feeling of Stress :   Social Connections:   . Frequency of Communication with Friends and Family:   . Frequency of Social Gatherings with Friends and Family:   . Attends Religious Services:   . Active Member of Clubs or Organizations:     . Attends Banker Meetings:   Marland Kitchen Marital Status:   Intimate Partner Violence:   . Fear of Current or Ex-Partner:   . Emotionally Abused:   Marland Kitchen Physically Abused:   . Sexually Abused:     No Known Allergies  Prior to Admission medications   Medication Sig Start Date End Date Taking? Authorizing Provider  escitalopram (LEXAPRO) 10 MG tablet Take 1 tablet (10 mg total) by mouth daily. 09/04/19   Vena Austria, MD  ibuprofen (ADVIL) 600 MG tablet Take 1 tablet (600 mg total) by mouth every 6 (six) hours as needed. 03/28/19   Vena Austria, MD  pantoprazole (PROTONIX) 20 MG tablet Take 20 mg by mouth daily as needed for heartburn.     [provider]  Probiotic CAPS Take 1 capsule by mouth daily.    [provider]  estrogens, conjugated, (PREMARIN) 1.25 MG tablet Take 1 tablet (1.25 mg total) by mouth daily for 14 days. 03/28/19 07/15/19  Vena Austria, MD  medroxyPROGESTERone (PROVERA) 10 MG tablet Take 1 tablet (10 mg total) by mouth daily for 10 days. Start after first 14 days of premarin 03/28/19 07/15/19  Vena Austria, MD    Review of Systems  Constitutional: Negative.   HENT: Negative.   Eyes: Negative.   Respiratory: Negative.   Cardiovascular: Negative.   Gastrointestinal: Negative.   Genitourinary: Negative.   Musculoskeletal: Negative.   Skin: Negative.   Neurological: Negative.   Psychiatric/Behavioral: Negative.      Physical Exam BP 122/74   Ht 5' (1.524 m)   Wt 215 lb (97.5 kg)   LMP 10/04/2019 (Approximate)   BMI 41.99 kg/m  Patient's last menstrual period was 10/04/2019 (approximate). Physical Exam Constitutional:      General: She is not in acute distress.    Appearance: Normal appearance. She is well-developed.  Genitourinary:     Pelvic exam was performed with patient in the lithotomy position.     Vulva, inguinal canal, urethra, bladder, vagina, uterus, right adnexa and left adnexa normal.     No posterior  fourchette tenderness, injury or lesion present.     No cervical friability, lesion, bleeding or polyp.     Genitourinary Comments: Scant bleeding in vaginal vault. Cervix without lesions.  HENT:     Head: Normocephalic and atraumatic.  Eyes:     General: No scleral icterus.    Conjunctiva/sclera: Conjunctivae normal.  Cardiovascular:     Rate and Rhythm: Normal rate and regular rhythm.     Heart sounds: No murmur. No friction rub. No gallop.   Pulmonary:     Effort: Pulmonary  effort is normal. No respiratory distress.     Breath sounds: Normal breath sounds. No wheezing or rales.  Abdominal:     General: Bowel sounds are normal. There is no distension.     Palpations: Abdomen is soft. There is no mass.     Tenderness: There is no abdominal tenderness. There is no guarding or rebound.  Musculoskeletal:        General: Normal range of motion.     Cervical back: Normal range of motion and neck supple.  Neurological:     General: No focal deficit present.     Mental Status: She is alert and oriented to person, place, and time.     Cranial Nerves: No cranial nerve deficit.  Skin:    General: Skin is warm and dry.     Findings: No erythema.  Psychiatric:        Mood and Affect: Mood normal.        Behavior: Behavior normal.        Judgment: Judgment normal.    Female chaperone present for pelvic and breast  portions of the physical exam  Assessment: Alexis Berg female here for  1. Intermenstrual bleeding     a Plan: Problem List Items Addressed This Visit    None    Visit Diagnoses    Intermenstrual bleeding    -  Primary     Discussed various etiologies of bleeding.  Range potential etiologies is wide.  We will continue to monitor.  Discussed that further work-up could be initiated,If bleeding continues.  She is actively trying to conceive.  If her menses become irregular, may need to consider regulating menses and assessing for ovulation.  After discussing the  work-up, if bleeding persists abnormally, may obtain a pelvic ultrasound, though a pelvic work-up with [facility] was performed recently.  She agrees to continuing to monitor the situation for now.  A total of 30 minutes were spent face-to-face with the patient as well as preparation, review, communication, and documentation during this encounter.    Prentice Docker, MD 10/21/2019 8:47 AM

## 2020-01-08 ENCOUNTER — Ambulatory Visit
Admission: EM | Admit: 2020-01-08 | Discharge: 2020-01-08 | Disposition: A | Discharge status: Home / Self Care | Payer: BC Managed Care – PPO | Attending: Internal Medicine | Admitting: Internal Medicine

## 2020-01-08 ENCOUNTER — Other Ambulatory Visit: Payer: Self-pay

## 2020-01-08 DIAGNOSIS — S40022A Contusion of left upper arm, initial encounter: Secondary | ICD-10-CM | POA: Diagnosis not present

## 2020-01-08 MED ORDER — IBUPROFEN 600 MG PO TABS: 600.0000 mg | ORAL_TABLET | Freq: Three times a day (TID) | ORAL | 0 refills | Status: DC | PRN

## 2020-01-08 NOTE — ED Triage Notes (Signed)
Patient states that last night while at work she bumped in to a machine and hit her left arm. Patient with bruising and swelling to her upper left arm.

## 2020-01-09 NOTE — ED Provider Notes (Signed)
[facility]    CSN: [id number] Arrival date & time: 01/08/20  1710      History   Chief Complaint Chief Complaint  Patient presents with  . Arm Pain    HPI Alexis Berg is a 29 y.o. female comes to the urgent care with complaints of pain over the left upper arm.  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  2 days ago she ran into one of the heavy duty machines hitting her left forearm.  Since then patient has experienced moderate intensity throbbing left upper arm pain.  Pain is aggravated by movement.  No known relieving factors.  No numbness or tingling in the hands.  No weakness. Past Medical History:  Diagnosis Date  . Anxiety   . GERD (gastroesophageal reflux disease)   . Heartburn   . Morbid obesity (HCC)   . MRSA (methicillin resistant staph aureus) culture positive    2013/2014-on right leg/ thigh  . Pruritus     Patient Active Problem List   Diagnosis Date Noted  . Anxiety 11/09/2018  . Heartburn 11/09/2018  . Morbid obesity (HCC)   . MRSA (methicillin resistant staph aureus) culture positive     Past Surgical History:  Procedure Laterality Date  . DILATION AND CURETTAGE OF UTERUS    . DILATION AND CURETTAGE OF UTERUS N/A 12/10/2018   Procedure: DILATATION AND CURETTAGE SUCTION;  Surgeon: Vena Austria, MD;  Location: [facility] ORS;  Service: Gynecology;  Laterality: N/A;  . INCISION AND DRAINAGE      OB History    Gravida  5   Para  3   Term  3   Preterm      AB  1   Living  3     SAB  0   TAB  1   Ectopic      Multiple      Live Births  3            Home Medications    Prior to Admission medications   Medication Sig Start Date End Date Taking? Authorizing Provider  escitalopram (LEXAPRO) 10 MG tablet Take 1 tablet (10 mg total) by mouth daily. 09/04/19  Yes Vena Austria, MD  pantoprazole (PROTONIX) 20 MG tablet Take 20 mg by mouth daily as needed for heartburn.    Yes [provider]  Probiotic CAPS Take 1 capsule by mouth daily.   Yes [provider]  ibuprofen (ADVIL) 600 MG tablet Take 1 tablet (600 mg total) by mouth every 8 (eight) hours as needed for moderate pain. 01/08/20   LampteyBritta Mccreedy, MD  estrogens, conjugated, (PREMARIN) 1.25 MG tablet Take 1 tablet (1.25 mg total) by mouth daily for 14 days. 03/28/19 07/15/19  Vena Austria, MD  medroxyPROGESTERone (PROVERA) 10 MG tablet Take 1 tablet (10 mg total) by mouth daily for 10 days. Start after first 14 days of premarin 03/28/19 07/15/19  Vena Austria, MD    Family History Family History  Problem Relation Age of Onset  . Breast cancer Mother 55  . Diabetes Mother   . Thyroid disease Mother   . Heart attack Father 1  . Parkinson's disease Father   . Sickle cell anemia Nephew        brother's son    Social History Social History   Tobacco Use  . Smoking status: Former Smoker    Packs/day: 0.25    Years: 5.00    Pack years: 1.25  Types: Cigarettes    Quit date: 08/13/2016    Years since quitting: 3.4  . Smokeless tobacco: Never Used  Vaping Use  . Vaping Use: Never used  Substance Use Topics  . Alcohol use: Not Currently    Comment: last alcohol 20 April-2 beer  . Drug use: No     Allergies   Patient has no known allergies.   Review of Systems Review of Systems  Respiratory: Negative.   Gastrointestinal: Negative.   Genitourinary: Negative.   Musculoskeletal: Positive for myalgias. Negative for arthralgias, gait problem and joint swelling.     Physical Exam Triage Vital Signs ED Triage Vitals  Enc Vitals Group     BP 01/08/20 1731 104/70     Pulse Rate 01/08/20 1731 89     Resp 01/08/20 1731 18     Temp 01/08/20 1731 98.2 F (36.8 C)     Temp Source 01/08/20 1731 Oral     SpO2 01/08/20 1731 100 %     Weight 01/08/20 1729 215 lb (97.5 kg)     Height 01/08/20 1729 5' (1.524 m)     Head Circumference --      Peak Flow --      Pain Score 01/08/20  1729 8     Pain Loc --      Pain Edu? --      Excl. in GC? --    No data found.  Updated Vital Signs BP 104/70 (BP Location: Right Arm)   Pulse 89   Temp 98.2 F (36.8 C) (Oral)   Resp 18   Ht 5' (1.524 m)   Wt 97.5 kg   LMP 12/24/2019   SpO2 100%   BMI 41.99 kg/m   Visual Acuity Right Eye Distance:   Left Eye Distance:   Bilateral Distance:    Right Eye Near:   Left Eye Near:    Bilateral Near:     Physical Exam Vitals and nursing note reviewed.  Constitutional:      General: She is not in acute distress.    Appearance: Normal appearance. She is not ill-appearing.  Cardiovascular:     Rate and Rhythm: Normal rate and regular rhythm.     Pulses: Normal pulses.     Heart sounds: Normal heart sounds.  Pulmonary:     Effort: Pulmonary effort is normal.     Breath sounds: Normal breath sounds.  Musculoskeletal:        General: Swelling, tenderness and signs of injury present. No deformity. Normal range of motion.  Skin:    General: Skin is warm.     Capillary Refill: Capillary refill takes less than 2 seconds.     Findings: Bruising present. No erythema, lesion or rash.  Neurological:     Mental Status: She is alert.      UC Treatments / Results  Labs (all labs ordered are listed, but only abnormal results are displayed) Labs Reviewed - No data to display  EKG   Radiology No results found.  Procedures Procedures (including critical care time)  Medications Ordered in UC Medications - No data to display  Initial Impression / Assessment and Plan / UC Course  I have reviewed the triage vital signs and the nursing notes.  Pertinent labs & imaging results that were available during my care of the patient were reviewed by me and considered in my medical decision making (see chart for details).     1.  Contusion of the left upper arm:  Warm compress Ibuprofen 600 every 6 hours as needed for pain Return to urgent care if symptoms worsen  Final  Clinical Impressions(s) / UC Diagnoses   Final diagnoses:  Contusion of right upper arm, initial encounter   Discharge Instructions   None    ED Prescriptions    Medication Sig Dispense Auth. Provider   ibuprofen (ADVIL) 600 MG tablet Take 1 tablet (600 mg total) by mouth every 8 (eight) hours as needed for moderate pain. 30 tablet Jammi Morrissette, Britta Mccreedy, MD     PDMP not reviewed this encounter.   Merrilee Jansky, MD 01/09/20 [phone]

## 2020-01-21 ENCOUNTER — Telehealth: Payer: BC Managed Care – PPO | Admitting: Family

## 2020-01-21 DIAGNOSIS — N898 Other specified noninflammatory disorders of vagina: Secondary | ICD-10-CM | POA: Diagnosis not present

## 2020-01-21 MED ORDER — FLUCONAZOLE 150 MG PO TABS: 150.0000 mg | ORAL_TABLET | ORAL | 0 refills | Status: DC | PRN

## 2020-01-21 NOTE — Progress Notes (Signed)
We are sorry that you are not feeling well. Here is how we plan to help! Based on what you shared with me it looks like you: May have a yeast vaginosis  Vaginosis is an inflammation of the vagina that can result in discharge, itching and pain. The cause is usually a change in the normal balance of vaginal bacteria or an infection. Vaginosis can also result from reduced estrogen levels after menopause.  The most common causes of vaginosis are:   Bacterial vaginosis which results from an overgrowth of one on several organisms that are normally present in your vagina.   Yeast infections which are caused by a naturally occurring fungus called candida.   Vaginal atrophy (atrophic vaginosis) which results from the thinning of the vagina from reduced estrogen levels after menopause.   Trichomoniasis which is caused by a parasite and is commonly transmitted by sexual intercourse.  Factors that increase your risk of developing vaginosis include:  Medications, such as antibiotics and steroids  Uncontrolled diabetes  Use of hygiene products such as bubble bath, vaginal spray or vaginal deodorant  Douching  Wearing damp or tight-fitting clothing  Using an intrauterine device (IUD) for birth control  Hormonal changes, such as those associated with pregnancy, birth control pills or menopause  Sexual activity  Having a sexually transmitted infection  Your treatment plan is A single Diflucan (fluconazole) 150mg  tablet once.  I have electronically sent this prescription into the pharmacy that you have chosen.   If you continue to have UTI symptoms, you will need to be seen face to face for urine testing.   Be sure to take all of the medication as directed. Stop taking any medication if you develop a rash, tongue swelling or shortness of breath. Mothers who are breast feeding should consider pumping and discarding their breast milk while on these antibiotics. However, there is no consensus that  infant exposure at these doses would be harmful.  Remember that medication creams can weaken latex condoms.   HOME CARE:  Good hygiene may prevent some types of vaginosis from recurring and may relieve some symptoms:   Avoid baths, hot tubs and whirlpool spas. Rinse soap from your outer genital area after a shower, and dry the area well to prevent irritation. Don't use scented or harsh soaps, such as those with deodorant or antibacterial action.  Avoid irritants. These include scented tampons and pads.  Wipe from front to back after using the toilet. Doing so avoids spreading fecal bacteria to your vagina.  Other things that may help prevent vaginosis include:   Don't douche. Your vagina doesn't require cleansing other than normal bathing. Repetitive douching disrupts the normal organisms that reside in the vagina and can actually increase your risk of vaginal infection. Douching won't clear up a vaginal infection.  Use a latex condom. Both female and female latex condoms may help you avoid infections spread by sexual contact.  Wear cotton underwear. Also wear pantyhose with a cotton crotch. If you feel comfortable without it, skip wearing underwear to bed. Yeast thrives in [place] Your symptoms should improve in the next day or two.  GET HELP RIGHT AWAY IF:   You have pain in your lower abdomen ( pelvic area or over your ovaries)  You develop nausea or vomiting  You develop a fever  Your discharge changes or worsens  You have persistent pain with intercourse  You develop shortness of breath, a rapid pulse, or you faint.  These symptoms could  be signs of problems or infections that need to be evaluated by a medical provider now.  MAKE SURE YOU    Understand these instructions.  Will watch your condition.  Will get help right away if you are not doing well or get worse.  Your e-visit answers were reviewed by a board certified advanced clinical  practitioner to complete your personal care plan. Depending upon the condition, your plan could have included both over the counter or prescription medications. Please review your pharmacy choice to make sure that you have choses a pharmacy that is open for you to pick up any needed prescription, Your safety is important to [place]. If you have drug allergies check your prescription carefully.   You can use MyChart to ask questions about todays visit, request a non-urgent call back, or ask for a work or school excuse for 24 hours related to this e-Visit. If it has been greater than 24 hours you will need to follow up with your provider, or enter a new e-Visit to address those concerns. You will get a MyChart message within the next two days asking about your experience. I hope that your e-visit has been valuable and will speed your recovery.  Approximately 5 minutes was spent documenting and reviewing patient's chart.

## 2020-02-01 ENCOUNTER — Emergency Department: Payer: BC Managed Care – PPO

## 2020-02-01 ENCOUNTER — Other Ambulatory Visit: Payer: Self-pay

## 2020-02-01 ENCOUNTER — Emergency Department
Admission: EM | Admit: 2020-02-01 | Discharge: 2020-02-01 | Disposition: A | Discharge status: Home / Self Care | Payer: BC Managed Care – PPO | Attending: Emergency Medicine | Admitting: Emergency Medicine

## 2020-02-01 DIAGNOSIS — K599 Functional intestinal disorder, unspecified: Secondary | ICD-10-CM | POA: Insufficient documentation

## 2020-02-01 DIAGNOSIS — Z87891 Personal history of nicotine dependence: Secondary | ICD-10-CM | POA: Diagnosis not present

## 2020-02-01 DIAGNOSIS — R109 Unspecified abdominal pain: Secondary | ICD-10-CM | POA: Diagnosis not present

## 2020-02-01 DIAGNOSIS — K219 Gastro-esophageal reflux disease without esophagitis: Secondary | ICD-10-CM | POA: Diagnosis not present

## 2020-02-01 DIAGNOSIS — R1032 Left lower quadrant pain: Secondary | ICD-10-CM | POA: Diagnosis not present

## 2020-02-01 LAB — BASIC METABOLIC PANEL
Anion gap: 10 (ref 5–15)
BUN: 13 mg/dL (ref 6–20)
CO2: 25 mmol/L (ref 22–32)
Calcium: 8.9 mg/dL (ref 8.9–10.3)
Chloride: 106 mmol/L (ref 98–111)
Creatinine, Ser: 0.58 mg/dL (ref 0.44–1.00)
GFR calc Af Amer: >60 mL/min (ref >60)
GFR calc non Af Amer: >60 mL/min (ref >60)
Glucose, Bld: 95 mg/dL (ref 70–99)
Potassium: 3.9 mmol/L (ref 3.5–5.1)
Sodium: 141 mmol/L (ref 135–145)

## 2020-02-01 LAB — URINALYSIS, COMPLETE (UACMP) WITH MICROSCOPIC
Bacteria, UA: NONE SEEN
Bilirubin Urine: NEGATIVE
Glucose, UA: NEGATIVE mg/dL
Hgb urine dipstick: NEGATIVE
Ketones, ur: NEGATIVE mg/dL
Leukocytes,Ua: NEGATIVE
Nitrite: NEGATIVE
Protein, ur: NEGATIVE mg/dL
Specific Gravity, Urine: 1.018 (ref 1.005–1.030)
pH: 7 (ref 5.0–8.0)

## 2020-02-01 LAB — CBC
HCT: 38.3 % (ref 36.0–46.0)
Hemoglobin: 12.6 g/dL (ref 12.0–15.0)
MCH: 32.6 pg (ref 26.0–34.0)
MCHC: 32.9 g/dL (ref 30.0–36.0)
MCV: 99.2 fL (ref 80.0–100.0)
Platelets: 287 10*3/uL (ref 150–400)
RBC: 3.86 MIL/uL — ABNORMAL LOW (ref 3.87–5.11)
RDW: 12.1 % (ref 11.5–15.5)
WBC: 8.6 10*3/uL (ref 4.0–10.5)
nRBC: 0 % (ref 0.0–0.2)

## 2020-02-01 LAB — POCT PREGNANCY, URINE: Preg Test, Ur: NEGATIVE

## 2020-02-01 MED ORDER — DROPERIDOL 2.5 MG/ML IJ SOLN
2.5000 mg | Freq: Once | INTRAMUSCULAR | Status: AC
  Administered 2020-02-01: 2.5 mg via INTRAMUSCULAR
  Filled 2020-02-01: qty 2

## 2020-02-01 NOTE — Discharge Instructions (Addendum)
You were seen in the ED because of your left flank pain.  We did blood work, urine testing and a CT scan that did not show signs of any acute problems.  You may have passed a kidney stone already, or you are feeling soreness in the muscles in your side, or the something like functional abdominal pain where we cannot find a problem but you still have some pain.  Please continue your normal care at home.  Continue to take Tylenol and ibuprofen for your discomfort.  If you develop any fevers, overlying rashes to that area, difficulty urinating or chest pain, please return to the emergency room.

## 2020-02-01 NOTE — ED Notes (Signed)
Pt updated on CT order and verbalized understanding. Warm blanket and pillow provided and lights dimmed. Call bell in reach. NAD at this time.

## 2020-02-01 NOTE — ED Triage Notes (Signed)
Patient reports left side/flank pain since yesterday.

## 2020-02-01 NOTE — ED Provider Notes (Signed)
[facility] Emergency Department Provider Note ____________________________________________   First MD Initiated Contact with Patient 02/01/20 [phone]     (approximate)  I have reviewed the triage vital signs and the nursing notes.  HISTORY  Chief Complaint Flank Pain   HPI Alexis Berg is a 29 y.o. female who presents to the ED for evaluation of flank pain.  Chart review indicates history of depression on SSRI, GERD on PPI.  Obesity.  Patient reports about 12-15 hours of left-sided flank pain.  She reports pain rapidly progressing at work yesterday and has been constant left-sided flank sharp pain, 6/10 intensity since that time.  She reports associated nausea without vomiting.  She reports taking Tylenol and ibuprofen with minimal relief of symptoms.  She reports mild relief with laying on her right side.  Denies associated fevers, chest pain, cough, syncope, hematuria, dysuria, frontal abdominal pain, vaginal discharge or bleeding.   Past Medical History:  Diagnosis Date   Anxiety    GERD (gastroesophageal reflux disease)    Heartburn    Morbid obesity (HCC)    MRSA (methicillin resistant staph aureus) culture positive    2013/2014-on right leg/ thigh   Pruritus     Patient Active Problem List   Diagnosis Date Noted   Anxiety 11/09/2018   Heartburn 11/09/2018   Morbid obesity (HCC)    MRSA (methicillin resistant staph aureus) culture positive     Past Surgical History:  Procedure Laterality Date   DILATION AND CURETTAGE OF UTERUS     DILATION AND CURETTAGE OF UTERUS N/A 12/10/2018   Procedure: DILATATION AND CURETTAGE SUCTION;  Surgeon: Vena Austria, MD;  Location: [facility] ORS;  Service: Gynecology;  Laterality: N/A;   INCISION AND DRAINAGE      Prior to Admission medications   Medication Sig Start Date End Date Taking? Authorizing Provider  escitalopram (LEXAPRO) 10 MG tablet Take 1 tablet (10 mg total) by mouth daily.  09/04/19   Vena Austria, MD  fluconazole (DIFLUCAN) 150 MG tablet Take 1 tablet (150 mg total) by mouth every three (3) days as needed. 01/21/20   Jannifer Rodney A, FNP  ibuprofen (ADVIL) 600 MG tablet Take 1 tablet (600 mg total) by mouth every 8 (eight) hours as needed for moderate pain. 01/08/20   Merrilee Jansky, MD  pantoprazole (PROTONIX) 20 MG tablet Take 20 mg by mouth daily as needed for heartburn.     [provider]  Probiotic CAPS Take 1 capsule by mouth daily.    [provider]  estrogens, conjugated, (PREMARIN) 1.25 MG tablet Take 1 tablet (1.25 mg total) by mouth daily for 14 days. 03/28/19 07/15/19  Vena Austria, MD  medroxyPROGESTERone (PROVERA) 10 MG tablet Take 1 tablet (10 mg total) by mouth daily for 10 days. Start after first 14 days of premarin 03/28/19 07/15/19  Vena Austria, MD    Allergies Patient has no known allergies.  Family History  Problem Relation Age of Onset   Breast cancer Mother 3   Diabetes Mother    Thyroid disease Mother    Heart attack Father 16   Parkinson's disease Father    Sickle cell anemia Nephew        brother's son    Social History Social History   Tobacco Use   Smoking status: Former Smoker    Packs/day: 0.25    Years: 5.00    Pack years: 1.25    Types: Cigarettes    Quit date: 08/13/2016    Years  since quitting: 3.4   Smokeless tobacco: Never Used  Vaping Use   Vaping Use: Never used  Substance Use Topics   Alcohol use: Not Currently    Comment: last alcohol 20 April-2 beer   Drug use: No    Review of Systems  Constitutional: No fever/chills Eyes: No visual changes. ENT: No sore throat. Cardiovascular: Denies chest pain. Respiratory: Denies shortness of breath. Gastrointestinal: no vomiting.  No diarrhea.  No constipation. Positive for nausea and left flank pain Genitourinary: Negative for dysuria. Musculoskeletal: Negative for back pain. Skin: Negative for  rash. Neurological: Negative for headaches, focal weakness or numbness.   ____________________________________________   PHYSICAL EXAM:  VITAL SIGNS: Vitals:   02/01/20 0256  BP: 116/76  Pulse: 86  Resp: 18  Temp: 98.8 F (37.1 C)  SpO2: 99%      Constitutional: Alert and oriented.  Obese.  Lying on her right side.  Conversational in full sentences. Eyes: Conjunctivae are normal. PERRL. EOMI. Head: Atraumatic. Nose: No congestion/rhinnorhea. Mouth/Throat: Mucous membranes are moist.  Oropharynx non-erythematous. Neck: No stridor. No cervical spine tenderness to palpation. Cardiovascular: Normal rate, regular rhythm. Grossly normal heart sounds.  Good peripheral circulation. Respiratory: Normal respiratory effort.  No retractions. Lungs CTAB. Gastrointestinal: Soft , nondistended, nontender to palpation. No abdominal bruits.  Mild left CVA tenderness without overlying skin changes or signs of trauma.  Benign abdomen anteriorly.. Musculoskeletal: No lower extremity tenderness nor edema.  No joint effusions. No signs of acute trauma. Neurologic:  Normal speech and language. No gross focal neurologic deficits are appreciated. No gait instability noted. Skin:  Skin is warm, dry and intact. No rash noted. Psychiatric: Mood and affect are normal. Speech and behavior are normal.  ____________________________________________   LABS (all labs ordered are listed, but only abnormal results are displayed)  Labs Reviewed  URINALYSIS, COMPLETE (UACMP) WITH MICROSCOPIC - Abnormal; Notable for the following components:      Result Value   Color, Urine YELLOW (*)    APPearance HAZY (*)    All other components within normal limits  CBC - Abnormal; Notable for the following components:   RBC 3.86 (*)    All other components within normal limits  BASIC METABOLIC PANEL  POC URINE PREG, ED  POCT PREGNANCY, URINE   ____________________________________________  12 Lead  EKG   ____________________________________________  RADIOLOGY  ED MD interpretation: CT reviewed and without evidence of intrarenal or ureteral stones  Official radiology report(s): CT Renal Stone Study  Result Date: 02/01/2020 CLINICAL DATA:  Left-sided flank pain since yesterday. EXAM: CT ABDOMEN AND PELVIS WITHOUT CONTRAST TECHNIQUE: Multidetector CT imaging of the abdomen and pelvis was performed following the standard protocol without IV contrast. COMPARISON:  None. FINDINGS: Lower chest: Clear lung bases.  Heart normal in size. Hepatobiliary: No focal liver abnormality is seen. No gallstones, gallbladder wall thickening, or biliary dilatation. Pancreas: Unremarkable. No pancreatic ductal dilatation or surrounding inflammatory changes. Spleen: Normal in size without focal abnormality. Adrenals/Urinary Tract: Adrenal glands are unremarkable. Kidneys are normal, without renal calculi, focal lesion, or hydronephrosis. Bladder is unremarkable. Stomach/Bowel: Stomach is within normal limits. Appendix appears normal. No evidence of bowel wall thickening, distention, or inflammatory changes. Vascular/Lymphatic: No significant vascular findings are present. No enlarged abdominal or pelvic lymph nodes. Reproductive: Uterus and bilateral adnexa are unremarkable. Other: No abdominal wall hernia or abnormality. No abdominopelvic ascites. Musculoskeletal: No acute or significant osseous findings. IMPRESSION: 1. Normal exam. No findings to account for left-sided flank pain. No renal or ureteral  stones or obstructive uropathy. Electronically Signed   By: Amie Portland M.D.   On: 02/01/2020 07:36    ____________________________________________   PROCEDURES and INTERVENTIONS  Procedure(s) performed (including Critical Care):  Procedures  Medications  droperidol (INAPSINE) 2.5 MG/ML injection 2.5 mg (2.5 mg Intramuscular Given 02/01/20 0755)    ____________________________________________   INITIAL  IMPRESSION / ASSESSMENT AND PLAN / ED COURSE  Obese 29 year old female presenting with acute left flank pain without evidence of acute pathology and amenable to outpatient management.  Normal vital signs on room air.  Exam with mild left flank/CVA tenderness without overlying signs of skin change or trauma.  She has a benign abdomen and no other evidence of acute pathology on my examination.  Blood work without acute derangements: No leukocytosis and she has normal renal function.  Not pregnant.  CT imaging with stone protocol without evidence of ureteral or nephrolithiasis.  Provided single dose of droperidol with improvement of symptoms.  Patient tolerated p.o. intake without complication.  We discussed outpatient management and return precautions to the ED were discussed, advised outpatient follow-up with her PCP within the next week to discuss her continued symptoms.  Patient medically stable for discharge home.  Clinical Course as of Jan 31 802  Wynelle Link Feb 01, 2020  [id number] Provided patient with cup of ice water.  Educated patient on reassuring work-up without evidence of acute pathology.  CT imaging without evidence of stone.  Advised patient she may have passed a stone already.  We also discussed functional abdominal pain. she expresses understanding agreement with plan for p.o. challenge and single dose of IM droperidol, with likely discharge home afterwards.   [DS]    Clinical Course User Index [DS] Delton Prairie, MD     ____________________________________________   FINAL CLINICAL IMPRESSION(S) / ED DIAGNOSES  Final diagnoses:  Left flank pain  Functional abdominal pain syndrome     ED Discharge Orders    None       Twilla Khouri Katrinka Blazing   Note:  This document was prepared using Dragon voice recognition software and may include unintentional dictation errors.   Delton Prairie, MD 02/01/20 [phone]

## 2020-02-12 ENCOUNTER — Telehealth: Payer: BC Managed Care – PPO | Admitting: Emergency Medicine

## 2020-02-12 DIAGNOSIS — K0889 Other specified disorders of teeth and supporting structures: Secondary | ICD-10-CM

## 2020-02-12 MED ORDER — FLUCONAZOLE 150 MG PO TABS: 150.0000 mg | ORAL_TABLET | Freq: Once | ORAL | 0 refills | Status: AC

## 2020-02-12 MED ORDER — AMOXICILLIN 500 MG PO CAPS: 500.0000 mg | ORAL_CAPSULE | Freq: Two times a day (BID) | ORAL | 0 refills | Status: DC

## 2020-02-12 MED ORDER — NAPROXEN 375 MG PO TABS: 375.0000 mg | ORAL_TABLET | Freq: Two times a day (BID) | ORAL | 0 refills | Status: DC

## 2020-02-12 NOTE — Progress Notes (Signed)
E-Visit for Mouth Ulcers  We are sorry that you are not feeling well.  Here is how we plan to help!  Based on what you have shared with me, it appears that you do have a dental infection     The following medications should decrease the discomfort and help with healing. Amoxicillin 500 mg twice daily for 7 days and diflucan 150 mg once for yeast vaginitis. I have also prescribed naproxen 375 mg twice a day for pain.  Mouth ulcers are painful areas in the mouth and gums. These are also known as "canker sores".  They can occur anywhere inside the mouth. While mostly harmless, mouth ulcers can be extremely uncomfortable and may make it difficult to eat, drink, and brush your teeth.  You may have more than 1 ulcer and they can vary and change in size. Mouth ulcers are not contagious and should not be confused with cold sores.  Cold sores appear on the lip or around the outside of the mouth and often begin with a tingling, burning or itching sensation.   While the exact causes are unknown, some common causes and factors that may aggravate mouth ulcers include: . Genetics - Sometimes mouth ulcers run in families . High alcohol intake . Acidic foods such as citrus fruits like pineapple, grapefruit, orange fruits/juices, may aggravate mouth ulcers . Other foods high in acidity or spice such as coffee, chocolate, chips, pretzels, eggs, nuts, cheese . Quitting smoking . Injury caused by biting the tongue or inside of the cheek . Diet lacking in B-12, zinc, folic acid or iron . Female hormone shifts with menstruation . Excessive fatigue, emotional stress or anxiety Prevention: . Talk to your doctor if you are taking meds that are known to cause mouth ulcers such as:   Anti-inflammatory drugs (for example Ibuprofen, Naproxen sodium), pain killers, Beta blockers, Oral nicotine replacement drugs, Some street drugs (heroin).   . Avoid allowing any tablets to dissolve in your mouth that are meant to  swallowed whole . Avoid foods/drinks that trigger or worsen symptoms . Keep your mouth clean with daily brushing and flossing  Home Care: . The goal with treatment is to ease the pain where ulcers occur and help them heal as quickly as possible.  There is no medical treatment to prevent mouth ulcers from coming back or recurring.  . Avoid spicy and acidic foods . Eat soft foods and avoid rough, crunchy foods . Avoid chewing gum . Do not use toothpaste that contains sodium lauryl sulphite . Use a straw to drink which helps avoid liquids toughing the ulcers near the front of your mouth . Use a very soft toothbrush . If you have dentures or dental hardware that you feel is not fitting well or contributing to his, please see your dentist. . Use saltwater mouthwash which helps healing. Dissolve a  teaspoon of salt in a glass of warm water. Swish around your mouth and spit it out. This can be used as needed if it is soothing.   GET HELP RIGHT AWAY IF: . Persistent ulcers require checking IN PERSON (face to face). Any mouth lesion lasting longer than a month should be seen by your DENTIST as soon as possible for evaluation for possible oral cancer. . If you have a non-painful ulcer in 1 or more areas of your mouth . Ulcers that are spreading, are very large or particularly painful . Ulcers last longer than one week without improving on treatment . If you develop  a fever, swollen glands and begin to feel unwell . Ulcers that developed after starting a new medication . You develop problems swallowing, neck swelling with a change in your voice. MAKE SURE YOU:  Understand these instructions.  Will watch your condition.  Will get help right away if you are not doing well or get worse.  Your e-visit answers were reviewed by a board certified advanced clinical practitioner to complete your personal care plan.  Depending upon the condition, your plan could have included both over the counter or  prescription medications.    Please review your pharmacy choice.  Be sure that the pharmacy you have chosen is open so that you can pick up your prescription now.  If there is a problem, you can message your provider in MyChart to have the prescription routed to another pharmacy.    Your safety is important to [place].  If you have drug allergies check our prescription carefully.  For the next 24 hours you can use MyChart to ask questions about today's visit, request a non-urgent call back, or ask for a work or school excuse from your e-visit provider.  You will get an email with a survey asking about your experience and to give [place] any feedback.  I hope that your e-visit has been valuable and will speed your recovery. **Please do not respond to this message unless you have follow up questions.** Greater than 5 but less than 10 minutes spent researching, coordinating, and implementing care for this patient today

## 2020-03-05 ENCOUNTER — Other Ambulatory Visit: Payer: Self-pay

## 2020-03-05 ENCOUNTER — Emergency Department
Admission: EM | Admit: 2020-03-05 | Discharge: 2020-03-05 | Disposition: A | Discharge status: Home / Self Care | Payer: BC Managed Care – PPO | Attending: Emergency Medicine | Admitting: Emergency Medicine

## 2020-03-05 DIAGNOSIS — O99891 Other specified diseases and conditions complicating pregnancy: Secondary | ICD-10-CM | POA: Insufficient documentation

## 2020-03-05 DIAGNOSIS — Z5321 Procedure and treatment not carried out due to patient leaving prior to being seen by health care provider: Secondary | ICD-10-CM | POA: Insufficient documentation

## 2020-03-05 DIAGNOSIS — R1030 Lower abdominal pain, unspecified: Secondary | ICD-10-CM | POA: Insufficient documentation

## 2020-03-05 DIAGNOSIS — Z3A08 8 weeks gestation of pregnancy: Secondary | ICD-10-CM | POA: Insufficient documentation

## 2020-03-05 LAB — COMPREHENSIVE METABOLIC PANEL
ALT: 25 U/L (ref 0–44)
AST: 29 U/L (ref 15–41)
Albumin: 4.2 g/dL (ref 3.5–5.0)
Alkaline Phosphatase: 69 U/L (ref 38–126)
Anion gap: 11 (ref 5–15)
BUN: 11 mg/dL (ref 6–20)
CO2: 25 mmol/L (ref 22–32)
Calcium: 9.1 mg/dL (ref 8.9–10.3)
Chloride: 102 mmol/L (ref 98–111)
Creatinine, Ser: 0.71 mg/dL (ref 0.44–1.00)
GFR calc Af Amer: >60 mL/min (ref >60)
GFR calc non Af Amer: >60 mL/min (ref >60)
Glucose, Bld: 99 mg/dL (ref 70–99)
Potassium: 3.4 mmol/L — ABNORMAL LOW (ref 3.5–5.1)
Sodium: 138 mmol/L (ref 135–145)
Total Bilirubin: 0.7 mg/dL (ref 0.3–1.2)
Total Protein: 7.6 g/dL (ref 6.5–8.1)

## 2020-03-05 LAB — CBC
HCT: 40.1 % (ref 36.0–46.0)
Hemoglobin: 13.4 g/dL (ref 12.0–15.0)
MCH: 32.5 pg (ref 26.0–34.0)
MCHC: 33.4 g/dL (ref 30.0–36.0)
MCV: 97.3 fL (ref 80.0–100.0)
Platelets: 296 10*3/uL (ref 150–400)
RBC: 4.12 MIL/uL (ref 3.87–5.11)
RDW: 12.3 % (ref 11.5–15.5)
WBC: 5.8 10*3/uL (ref 4.0–10.5)
nRBC: 0 % (ref 0.0–0.2)

## 2020-03-05 LAB — HCG, QUANTITATIVE, PREGNANCY: hCG, Beta Chain, Quant, S: 2 m[IU]/mL (ref <5)

## 2020-03-05 NOTE — ED Triage Notes (Signed)
Reports that she is [redacted] weeks pregnant states that she had a day last week of spotting all day, states this am she has been having a lot of lower abd pain/supra pubic pain, denies any bleeding at this time, states that the pain is intermittent and reminds her of when she had her last miscarriage. Pt denies pain with urination

## 2020-03-05 NOTE — ED Notes (Signed)
Pt reported was going to OB.

## 2020-04-20 ENCOUNTER — Telehealth: Payer: Self-pay | Admitting: Physician Assistant

## 2020-04-20 DIAGNOSIS — K0889 Other specified disorders of teeth and supporting structures: Secondary | ICD-10-CM

## 2020-04-20 MED ORDER — FLUCONAZOLE 150 MG PO TABS: 150.0000 mg | ORAL_TABLET | Freq: Once | ORAL | 0 refills | Status: AC

## 2020-04-20 MED ORDER — AMOXICILLIN 500 MG PO CAPS: 500.0000 mg | ORAL_CAPSULE | Freq: Three times a day (TID) | ORAL | 0 refills | Status: AC

## 2020-04-20 NOTE — Progress Notes (Signed)
E-Visit for Dental Pain  We are sorry that you are not feeling well.  Here is how we plan to help!  Based on what you have shared with me in the questionnaire, it sounds like you have a dental infection.   Amoxicillin 500mg  3 times per day for 10 days   I have also prescribed Fluconazole to help treat a yeast infection if you develop one. Do not take this medication unless you actually experience symptoms of a yeast infection. This will not prevent a yeast infection from happening but will treat one if you develop symptoms.  It is imperative that you see a dentist within 10 days of this eVisit to determine the cause of the dental pain and be sure it is adequately treated  A toothache or tooth pain is caused when the nerve in the root of a tooth or surrounding a tooth is irritated. Dental (tooth) infection, decay, injury, or loss of a tooth are the most common causes of dental pain. Pain may also occur after an extraction (tooth is pulled out). Pain sometimes originates from other areas and radiates to the jaw, thus appearing to be tooth pain.Bacteria growing inside your mouth can contribute to gum disease and dental decay, both of which can cause pain. A toothache occurs from inflammation of the central portion of the tooth called pulp. The pulp contains nerve endings that are very sensitive to pain. Inflammation to the pulp or pulpitis may be caused by dental cavities, trauma, and infection.    HOME CARE:   For toothaches: . Over-the-counter pain medications such as acetaminophen or ibuprofen may be used. Take these as directed on the package while you arrange for a dental appointment. . Avoid very cold or hot foods, because they may make the pain worse. . You may get relief from biting on a cotton ball soaked in oil of cloves. You can get oil of cloves at most drug stores.  For jaw pain: .  Aspirin may be helpful for problems in the joint of the jaw in adults. . If pain happens every time  you open your mouth widely, the temporomandibular joint (TMJ) may be the source of the pain. Yawning or taking a large bite of food may worsen the pain. An appointment with your doctor or dentist will help you find the cause.     GET HELP RIGHT AWAY IF:  . You have a high fever or chills . If you have had a recent head or face injury and develop headache, light headedness, nausea, vomiting, or other symptoms that concern you after an injury to your face or mouth, you could have a more serious injury in addition to your dental injury. . A facial rash associated with a toothache: This condition may improve with medication. Contact your doctor for them to decide what is appropriate. . Any jaw pain occurring with chest pain: Although jaw pain is most commonly caused by dental disease, it is sometimes referred pain from other areas. People with heart disease, especially people who have had stents placed, people with diabetes, or those who have had heart surgery may have jaw pain as a symptom of heart attack or angina. If your jaw or tooth pain is associated with lightheadedness, sweating, or shortness of breath, you should see a doctor as soon as possible. . Trouble swallowing or excessive pain or bleeding from gums: If you have a history of a weakened immune system, diabetes, or steroid use, you may be more susceptible  to infections. Infections can often be more severe and extensive or caused by unusual organisms. Dental and gum infections in people with these conditions may require more aggressive treatment. An abscess may need draining or IV antibiotics, for example.  MAKE SURE YOU    Understand these instructions.  Will watch your condition.  Will get help right away if you are not doing well or get worse.  Thank you for choosing an e-visit. Your e-visit answers were reviewed by a board certified advanced clinical practitioner to complete your personal care plan. Depending upon the condition,  your plan could have included both over the counter or prescription medications. Please review your pharmacy choice. Make sure the pharmacy is open so you can pick up prescription now. If there is a problem, you may contact your provider through [employer] and have the prescription routed to another pharmacy. Your safety is important to [place]. If you have drug allergies check your prescription carefully.  For the next 24 hours you can use MyChart to ask questions about today's visit, request a non-urgent call back, or ask for a work or school excuse. You will get an email in the next two days asking about your experience. I hope that your e-visit has been valuable and will speed your recovery.  Approximately 5 minutes was spent documenting and reviewing patient's chart.

## 2020-04-20 NOTE — Addendum Note (Signed)
Addended by: Karrie Meres on: 04/20/2020 11:57 AM   Modules accepted: Orders

## 2020-07-14 ENCOUNTER — Telehealth: Payer: Self-pay | Admitting: Nurse Practitioner

## 2020-07-14 DIAGNOSIS — N898 Other specified noninflammatory disorders of vagina: Secondary | ICD-10-CM

## 2020-07-14 MED ORDER — FLUCONAZOLE 150 MG PO TABS: 150.0000 mg | ORAL_TABLET | Freq: Once | ORAL | 0 refills | Status: AC

## 2020-07-14 NOTE — Progress Notes (Signed)

## 2020-08-01 ENCOUNTER — Encounter: Payer: Self-pay | Admitting: Physician Assistant

## 2020-08-01 ENCOUNTER — Telehealth: Payer: Self-pay | Admitting: Physician Assistant

## 2020-08-01 DIAGNOSIS — N898 Other specified noninflammatory disorders of vagina: Secondary | ICD-10-CM

## 2020-08-01 MED ORDER — METRONIDAZOLE 500 MG PO TABS: 500.0000 mg | ORAL_TABLET | Freq: Two times a day (BID) | ORAL | 0 refills | Status: DC

## 2020-08-01 NOTE — Progress Notes (Signed)

## 2020-09-22 IMAGING — US OBSTETRIC <14 WK ULTRASOUND
1 series · 14 of 28 positions shown · non-contrast
Comparison: None.

CLINICAL DATA: Vaginal bleeding with pelvic pain

EXAM:
OBSTETRIC <14 WK US AND TRANSVAGINAL OB US
TECHNIQUE: Both transabdominal and transvaginal ultrasound examinations were
performed for complete evaluation of the gestation as well as the
maternal uterus, adnexal regions, and pelvic cul-de-sac.
Transvaginal technique was performed to assess early pregnancy.

[Series 1: obstetric <14 wk ultrasound · 0.23mm/px · 99 acquisitions, 14 frames shown]
[im 4/99]
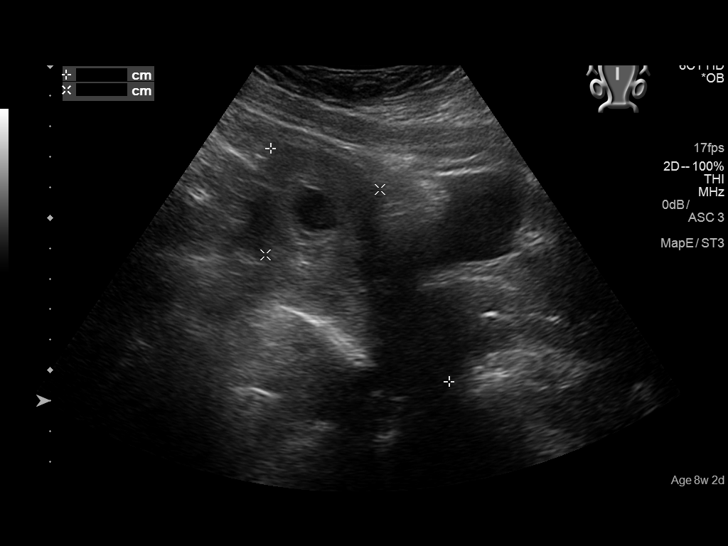
[im 11/99]
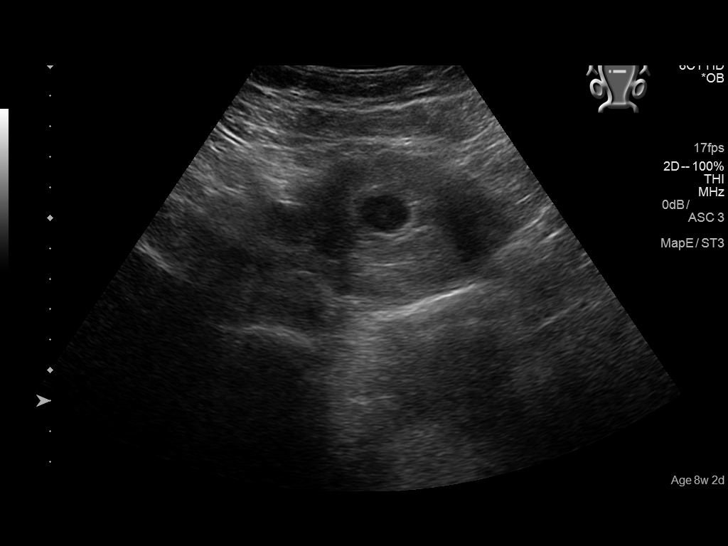
[im 19/99]
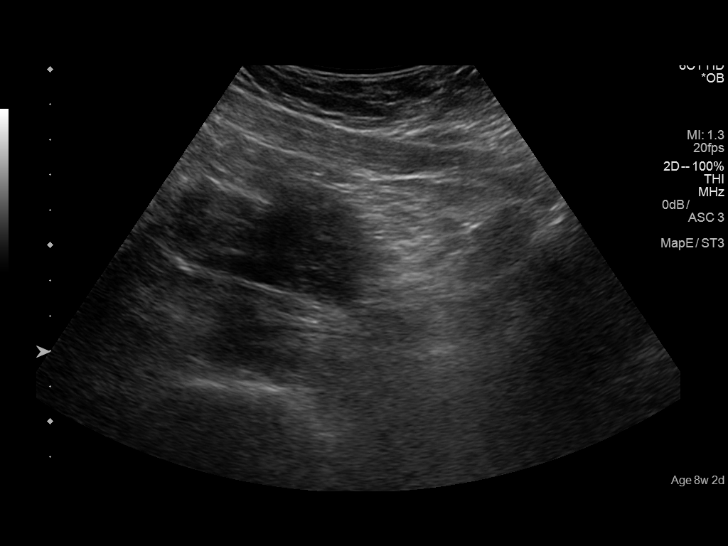
[im 26/99]
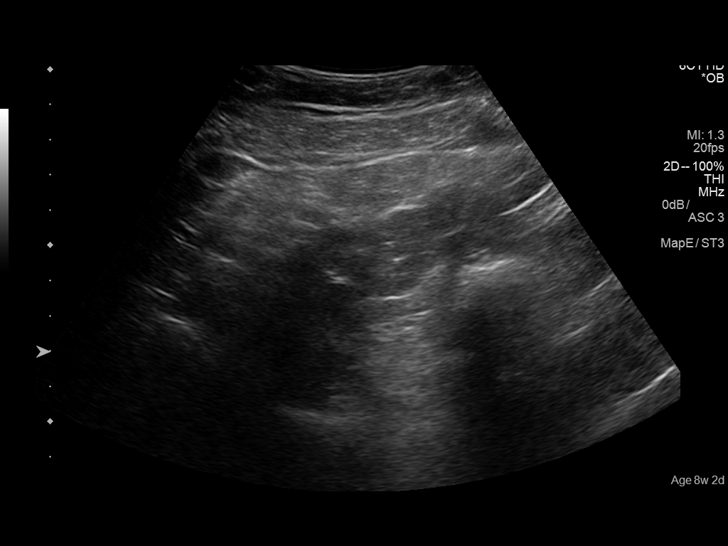
[im 33/99]
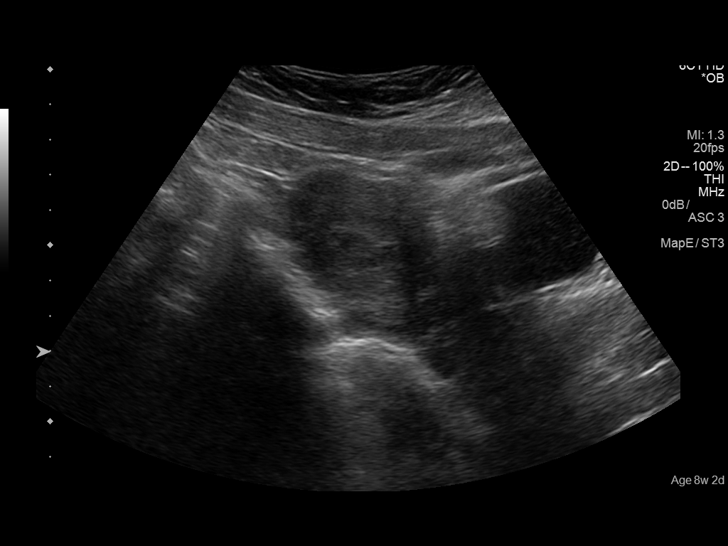
[im 40/99]
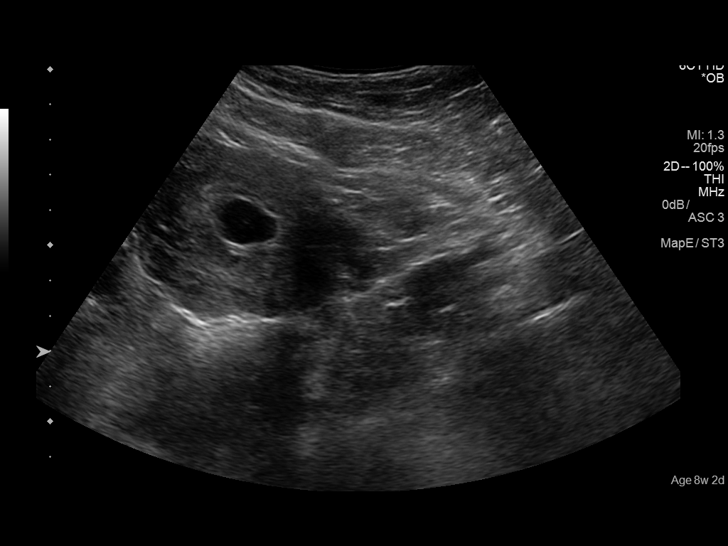
[im 48/99]
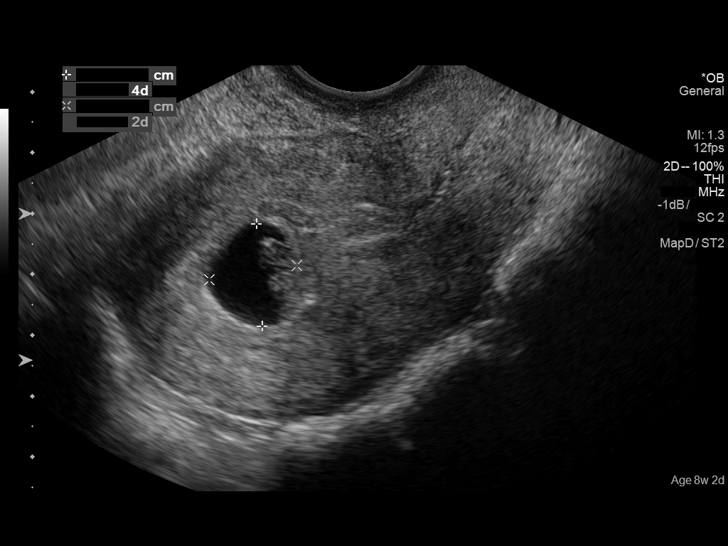
[im 55/99]
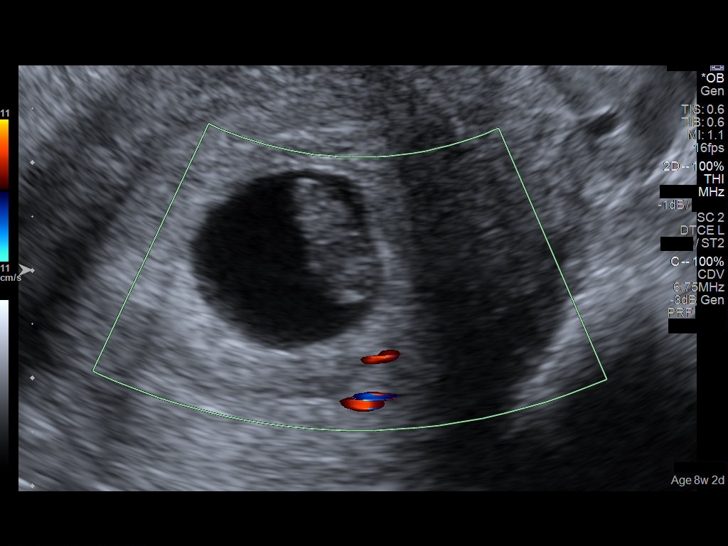
[im 62/99]
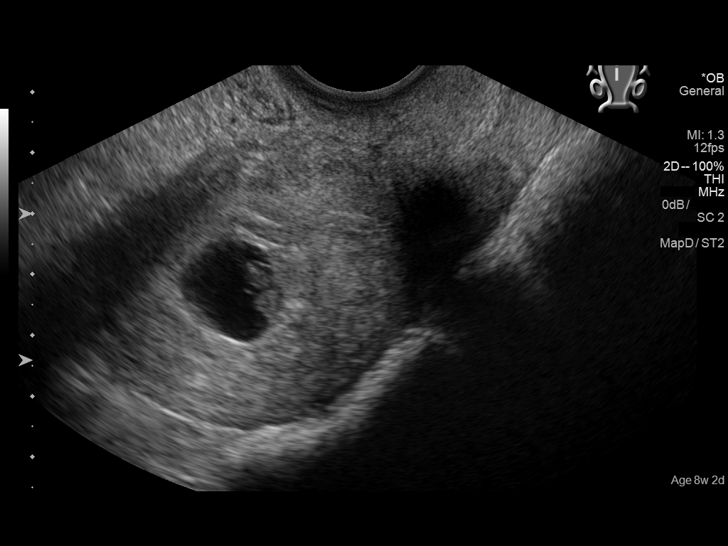
[im 69/99]
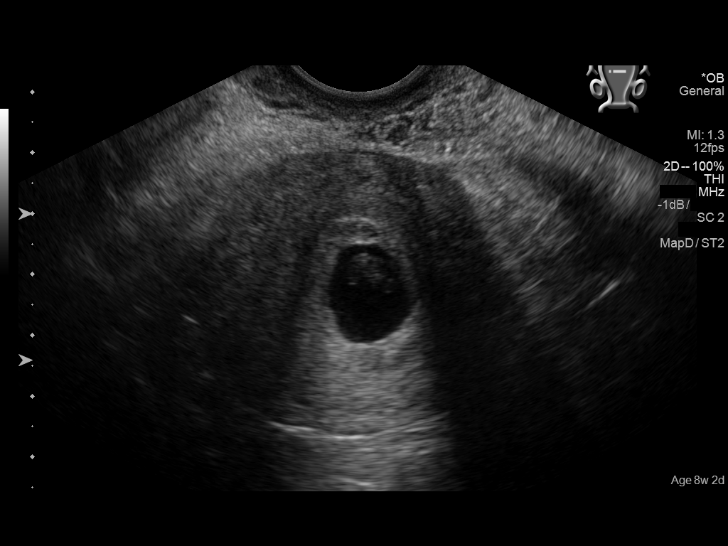
[im 77/99]
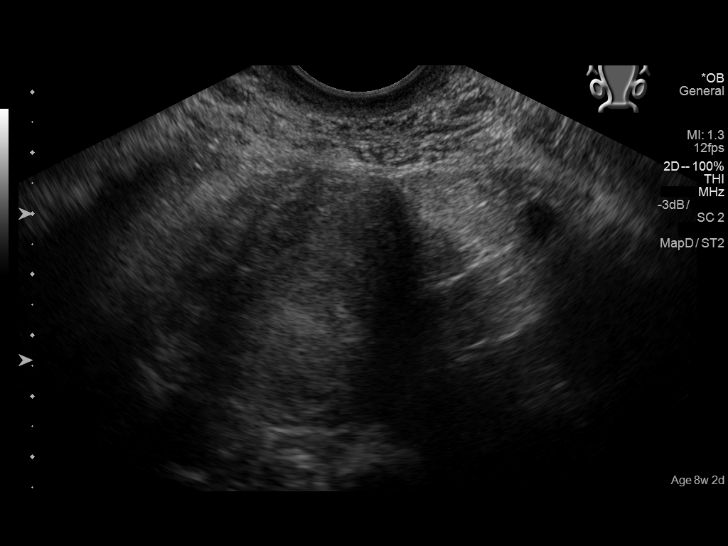
[im 84/99]
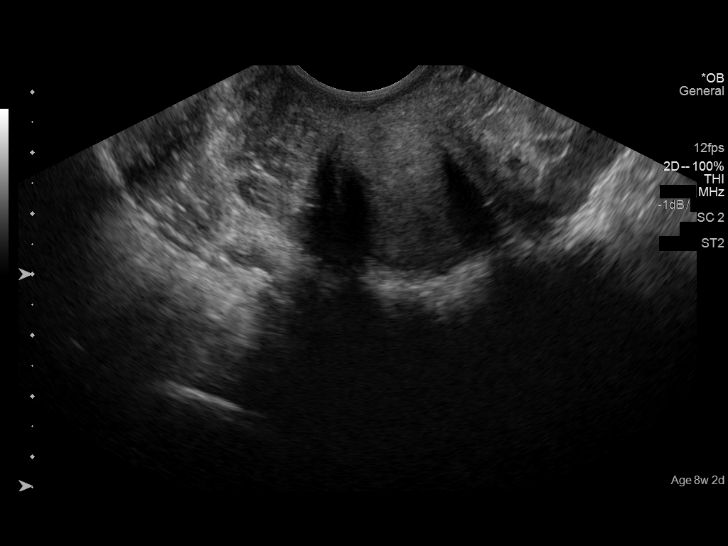
[im 91/99]
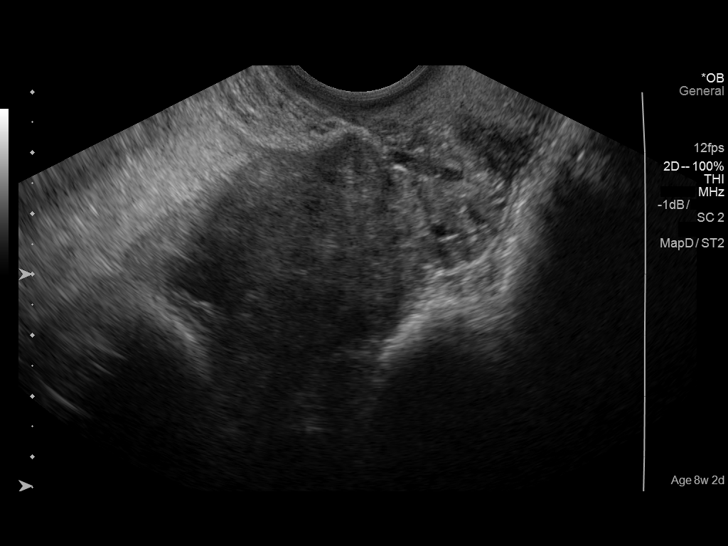
[im 99/99]
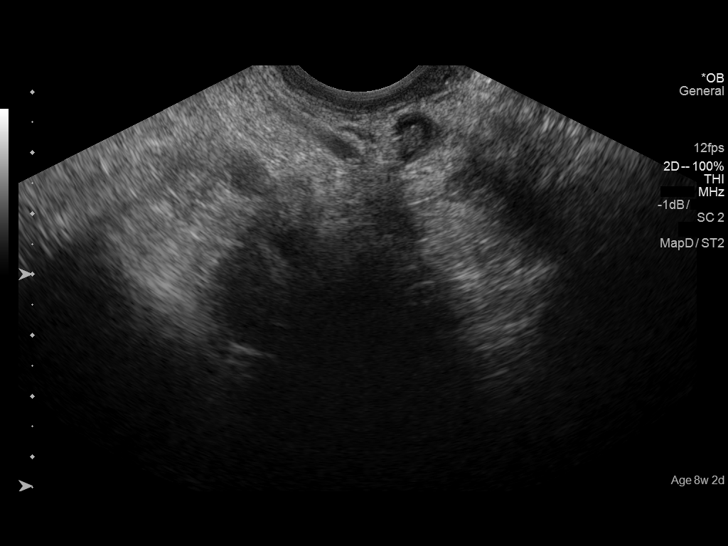

[14 of 28 positions shown; findings below may reference images not displayed]

FINDINGS: Intrauterine gestational sac: Visualized

Yolk sac:  Visualized

Embryo:  Visualized

Cardiac Activity: Not visualized

CRL:  13 mm   7 w   4 d

Subchorionic hemorrhage:  None visualized.

Maternal uterus/adnexae: Cervical os is closed. Right ovary measures
2.5 x 1.8 x 2.7 cm. Left ovary measures 3.1 x 1.8 x 2.2 cm. No
extrauterine pelvic or adnexal mass. No free pelvic fluid.
IMPRESSION: 13 mm in length fetal pole without demonstrable cardiac activity.
Findings meet definitive criteria for failed pregnancy. This follows
SRU consensus guidelines: Diagnostic Criteria for Nonviable
Pregnancy Early in the First Trimester. N Engl J Med

Study otherwise unremarkable.

## 2020-09-30 ENCOUNTER — Other Ambulatory Visit: Payer: Self-pay

## 2020-09-30 ENCOUNTER — Ambulatory Visit: Payer: Self-pay

## 2020-09-30 ENCOUNTER — Ambulatory Visit (LOCAL_COMMUNITY_HEALTH_CENTER): Payer: BC Managed Care – PPO | Admitting: Physician Assistant

## 2020-09-30 VITALS — BP 124/75 | Ht 60.0 in | Wt 250.8 lb

## 2020-09-30 DIAGNOSIS — Z0000 Encounter for general adult medical examination without abnormal findings: Secondary | ICD-10-CM

## 2020-09-30 DIAGNOSIS — G43909 Migraine, unspecified, not intractable, without status migrainosus: Secondary | ICD-10-CM | POA: Insufficient documentation

## 2020-09-30 DIAGNOSIS — Z3009 Encounter for other general counseling and advice on contraception: Secondary | ICD-10-CM

## 2020-09-30 DIAGNOSIS — Z3161 Procreative counseling and advice using natural family planning: Secondary | ICD-10-CM

## 2020-09-30 NOTE — Progress Notes (Signed)
Pt here for PE.  Pt c/o's multiple "boils" in groin area.  Berdie Ogren, RN

## 2020-10-01 ENCOUNTER — Encounter: Payer: Self-pay | Admitting: Physician Assistant

## 2020-10-01 MED ORDER — CEPHALEXIN 500 MG PO CAPS: 500.0000 mg | ORAL_CAPSULE | Freq: Two times a day (BID) | ORAL | 0 refills | Status: AC

## 2020-10-01 NOTE — Progress Notes (Signed)
[facility] DEPARTMENT [facility] [address] Main Number: [phone]    Family Planning Visit- Initial Visit  Subjective:  Alexis Berg is a 30 y.o.  [phone]   being seen today for an initial annual visit and to discuss contraceptive options.  The patient is currently using an app to monitor cycle for pregnancy prevention. Patient reports she does want a pregnancy in the next year.  Patient has the following medical conditions has Morbid obesity (HCC); MRSA (methicillin resistant staph aureus) culture positive; Anxiety; Heartburn; and Migraines on their problem list.  Chief Complaint  Patient presents with  . Contraception    Initial annual exam and discuss BCM.    Patient reports that she would like to get pregnant and that she was previously on Depo with her last shot approximately 2 years ago.  Reports that she has had regular periods for almost 1 year since d/c Depo.  Reports that she was being seen for irregular periods and infertility at [facility] in the past year or so.  Reports history of migraines that occasionally happen and are relieved by OTC analgesic and caffeine.  Denies aura, vision changes and neuro symptoms.   Reports that she has a history of anxiety and depression for which she takes medicines.  Reports also that she has a history of "boils" and that she currently has 3 very painful ones in pubic and genital area.  Patient states that she is currently on her period and is bleeding heavily.  Per chart review pap is due in 2023 and will do CBE today.  Patient requests Rx for antibiotic, Keflex, for her boils.  Patient denies other concerns today.    Body mass index is 48.98 kg/m. - Patient is eligible for diabetes screening based on BMI and age >8?  not applicable HA1C ordered? not applicable  Patient reports 1  partner/s in last year. Desires STI screening?  No - pt declines today due to menses.  Has patient been screened  once for HCV in the past?  No  No results found for: HCVAB  Does the patient have current drug use (including MJ), have a partner with drug use, and/or has been incarcerated since last result? No  If yes-- Screen for HCV through [facility]   Does the patient meet criteria for HBV testing? No  Criteria:  -Household, sexual or needle sharing contact with HBV -History of drug use -HIV positive -Those with known Hep C   Health Maintenance Due  Topic Date Due  . Hepatitis C Screening  Never done  . COVID-19 Vaccine (1) Never done  . TETANUS/TDAP  Never done    Review of Systems  All other systems reviewed and are negative.   The following portions of the patient's history were reviewed and updated as appropriate: allergies, current medications, past family history, past medical history, past social history, past surgical history and problem list. Problem list updated.   See flowsheet for other program required questions.  Objective:   Vitals:   09/30/20 1011  BP: 124/75  Weight: 250 lb 12.8 oz (113.8 kg)  Height: 5' (1.524 m)    Physical Exam Vitals and nursing note reviewed.  Constitutional:      General: She is not in acute distress.    Appearance: Normal appearance.  HENT:     Head: Normocephalic and atraumatic.     Mouth/Throat:     Mouth: Mucous membranes are moist.     Pharynx:  Oropharynx is clear. No oropharyngeal exudate or posterior oropharyngeal erythema.  Eyes:     Conjunctiva/sclera: Conjunctivae normal.  Neck:     Thyroid: No thyroid mass, thyromegaly or thyroid tenderness.  Cardiovascular:     Rate and Rhythm: Normal rate and regular rhythm.  Pulmonary:     Effort: Pulmonary effort is normal.     Breath sounds: Normal breath sounds.  Chest:  Breasts:     Right: Normal. No mass, nipple discharge, skin change, tenderness, axillary adenopathy or supraclavicular adenopathy.     Left: Normal. No mass, nipple discharge, skin change, tenderness,  axillary adenopathy or supraclavicular adenopathy.    Abdominal:     Palpations: Abdomen is soft. There is no mass.     Tenderness: There is no abdominal tenderness. There is no guarding or rebound.  Musculoskeletal:     Cervical back: Neck supple. No tenderness.  Lymphadenopathy:     Cervical: No cervical adenopathy.     Upper Body:     Right upper body: No supraclavicular, axillary or pectoral adenopathy.     Left upper body: No supraclavicular, axillary or pectoral adenopathy.  Skin:    General: Skin is warm and dry.     Findings: No bruising, erythema, lesion or rash.  Neurological:     Mental Status: She is alert and oriented to person, place, and time.  Psychiatric:        Mood and Affect: Mood normal.        Behavior: Behavior normal.        Thought Content: Thought content normal.        Judgment: Judgment normal.       Assessment and Plan:  Alexis Berg is a 30 y.o. female presenting to the [facility] Department for an initial annual wellness/contraceptive visit  Contraception counseling: Reviewed all forms of birth control options in the tiered based approach. available including abstinence; over the counter/barrier methods; hormonal contraceptive medication including pill, patch, ring, injection,contraceptive implant, ECP; hormonal and nonhormonal IUDs; permanent sterilization options including vasectomy and the various tubal sterilization modalities. Risks, benefits, and typical effectiveness rates were reviewed.  Questions were answered.  Written information was also given to the patient to review.  Patient desires to achieve pregnancy. She will follow up in  1 year and prn for surveillance.  She was told to call with any further questions, or with any concerns about this method of contraception.  Emphasized use of condoms 100% of the time for STI prevention.  Patient was not a candidate for ECP today.  1. Encounter for counseling regarding  contraception Reviewed with patient BCM options as above. Enc condoms with all sex if changes mind re: achieving pregnancy. RTC if decides to use hormonal BCM.  2. Well woman exam (no gynecological exam) Reviewed with patient healthy habits to maintain general health. Reviewed with patient healthy habits for self and partner to achieve pregnancy. Enc MVI 1 po daily.  Rx for Keflex 500 mg #14 1 po BID for 7 days, no refills written and given to patient to use for helping resolve boils and counseled patient to follow up with PCP or Derm. Enc to establish with/ follow up with PCP for primary care concerns, age appropriate screenings and illness.  3. Procreative counseling and advice using natural family planning Reviewed with patient and written info on Natural Family Planning given to patient to use/review. Enc patient to follow up with GYN if she has not achieved pregnancy in 6-9 months.  Return in about 1 year (around 09/30/2021) for RP and prn.  No future appointments.  Matt Holmes, PA

## 2020-10-07 IMAGING — US OBSTETRIC <14 WK US AND TRANSVAGINAL OB US
1 series · 13 of 28 positions shown · non-contrast
Comparison: 11/22/2018

CLINICAL DATA: Follow-up viability, continued vaginal bleeding

EXAM:
OBSTETRIC <14 WK US AND TRANSVAGINAL OB US
TECHNIQUE: Both transabdominal and transvaginal ultrasound examinations were
performed for complete evaluation of the gestation as well as the
maternal uterus, adnexal regions, and pelvic cul-de-sac.
Transvaginal technique was performed to assess early pregnancy.

[Series 1: obstetric <14 wk us and transvaginal ob us · 13 of 154 slices shown]
[im 6/154]
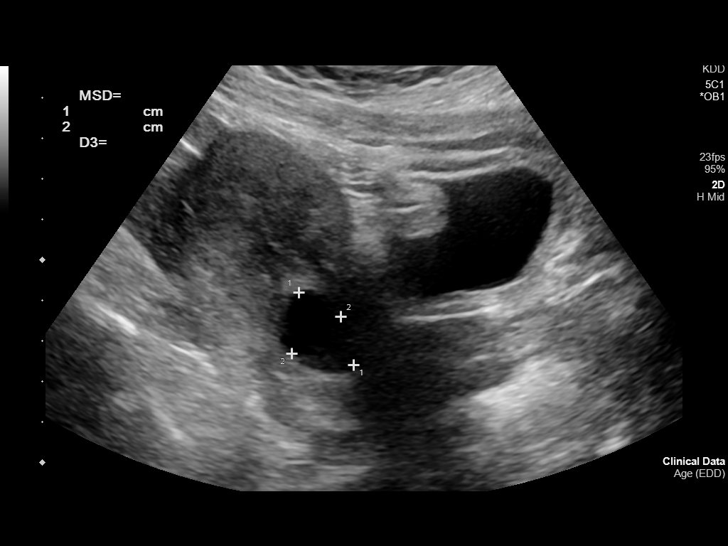
[im 18/154]
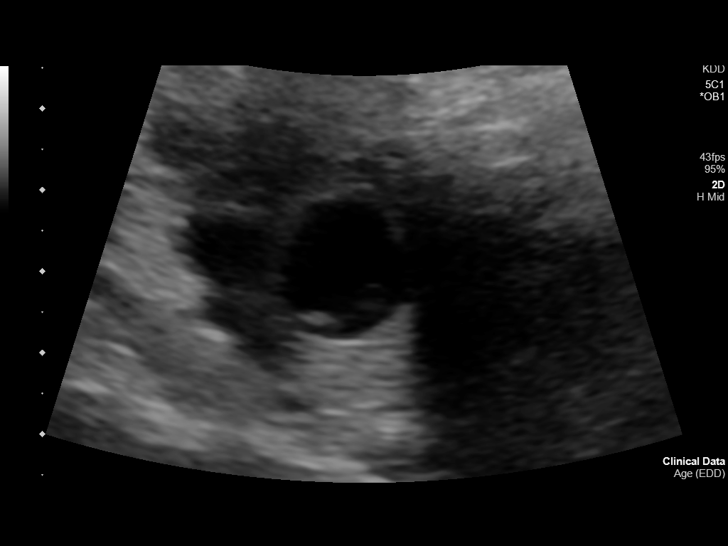
[im 29/154]
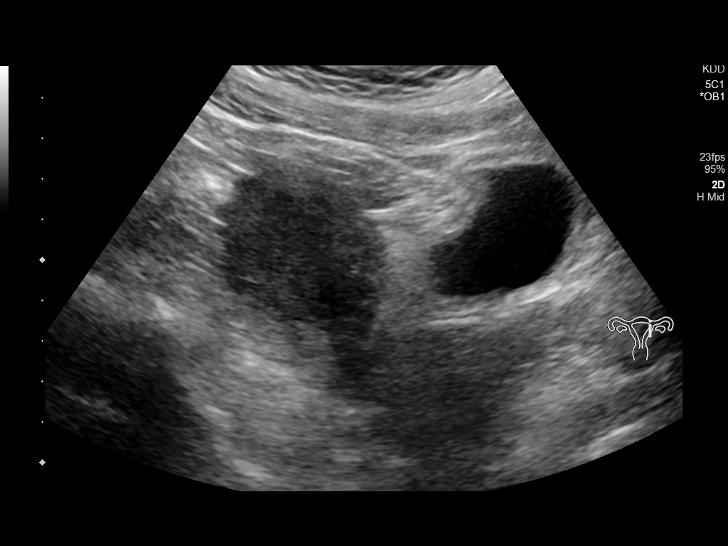
[im 40/154]
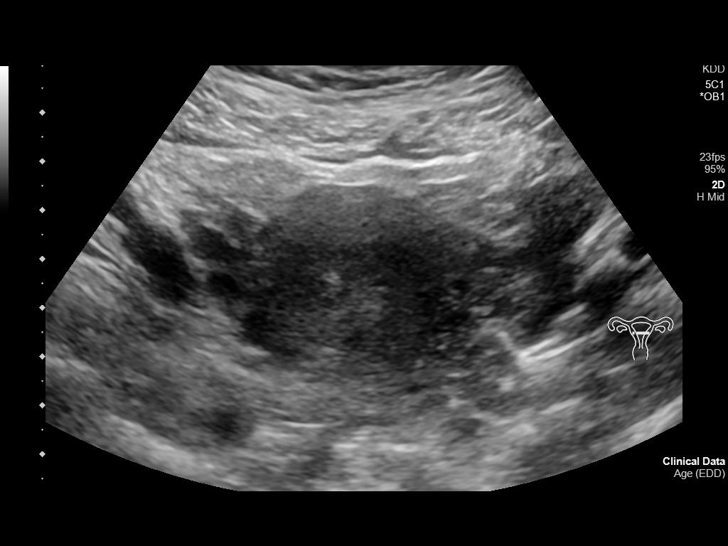
[im 52/154]
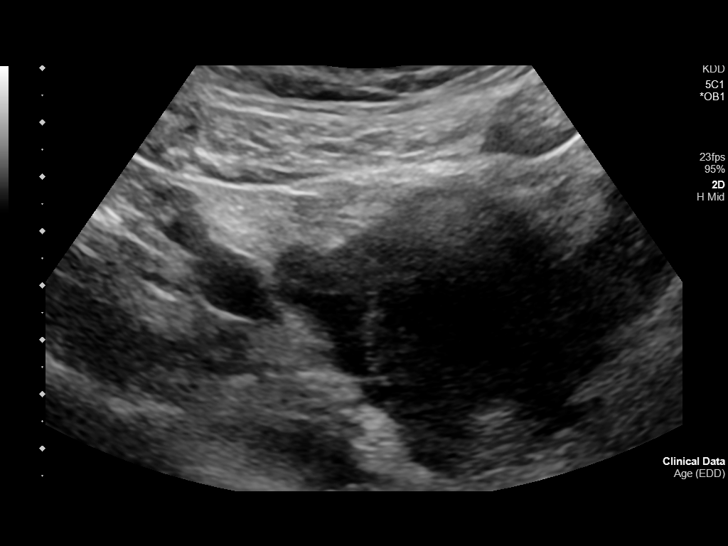
[im 63/154]
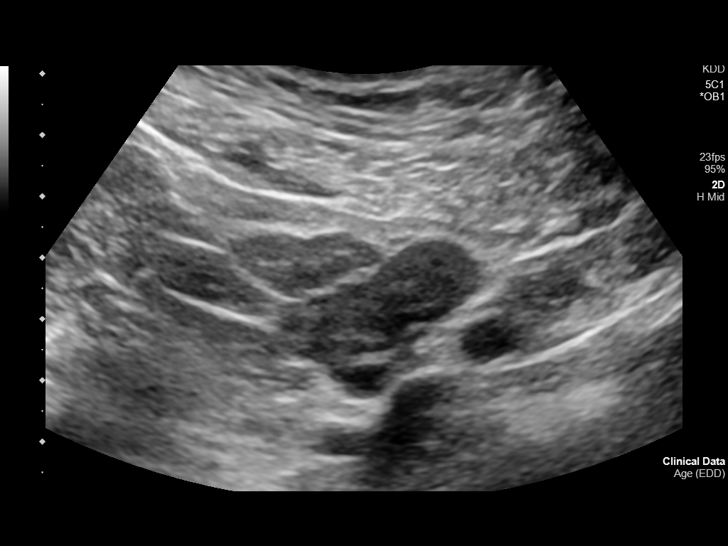
[im 80/154]
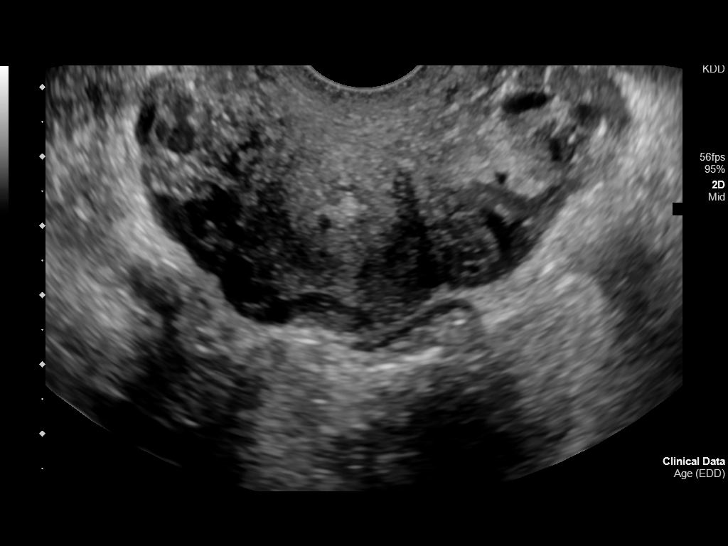
[im 91/154]
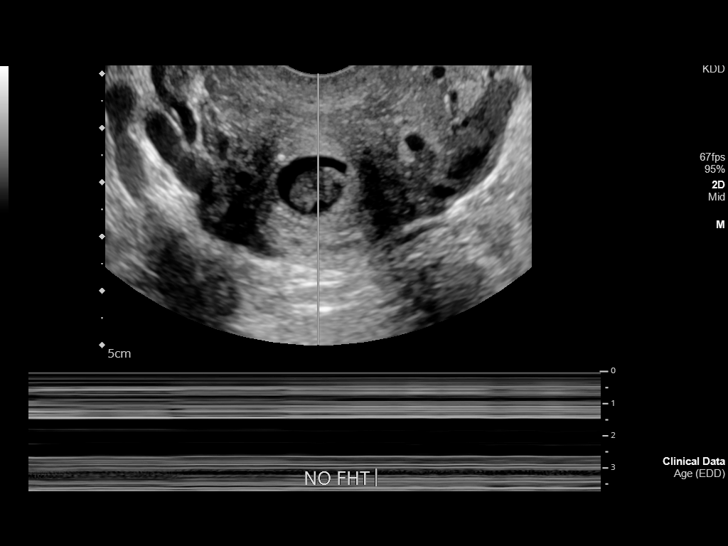
[im 103/154]
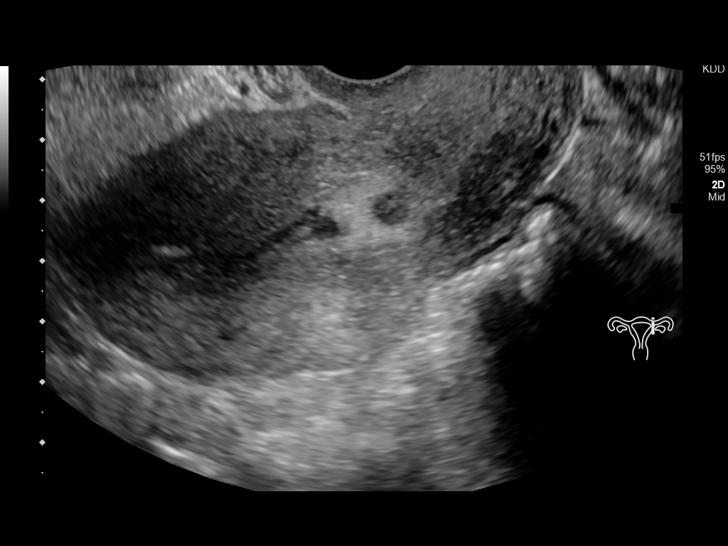
[im 114/154]
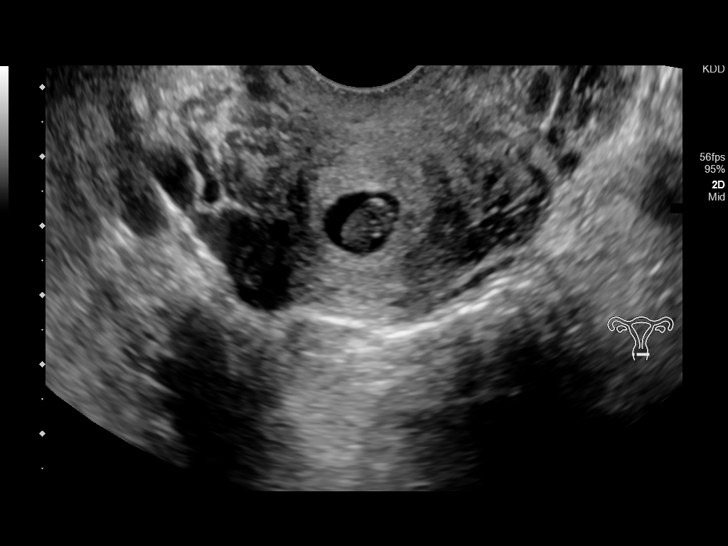
[im 125/154]
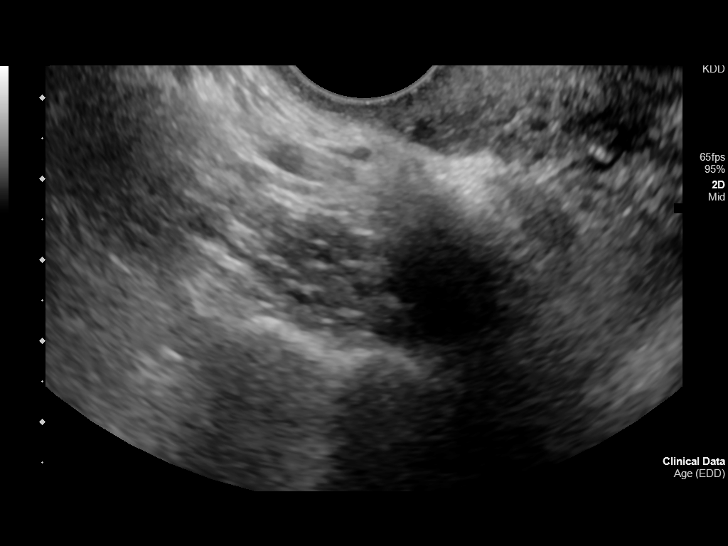
[im 137/154]
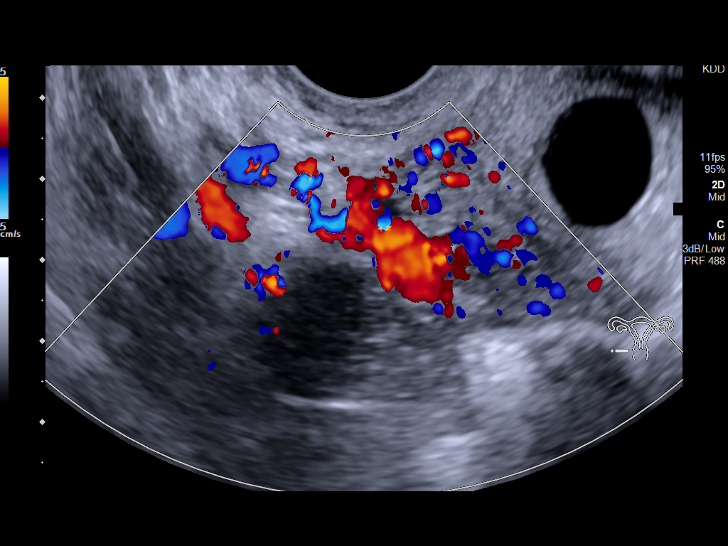
[im 148/154]
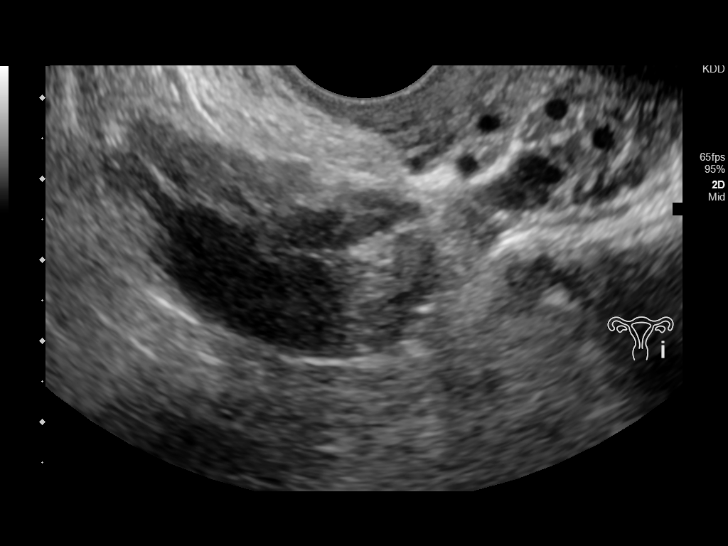

[13 of 28 positions shown; findings below may reference images not displayed]

FINDINGS: Intrauterine gestational sac: Single

Yolk sac:  Visualized.

Embryo:  Visualized.

Cardiac Activity: Not Visualized.

CRL: 12 mm   7 w   3 d

Subchorionic hemorrhage:  None visualized.

Maternal uterus/adnexae: Right ovary is within normal limits and
measures 1.6 by 2.7 by 1.7 cm with a volume of 3.7 mL. The left
ovary measures 3.9 x 2.5 x 3.1 cm with a volume of 15.7 mL. Probable
corpus luteum in the left ovary. Gestational sac is now visible
within the lower uterine segment and cervix. No significant free
fluid.
IMPRESSION: 1. Single intrauterine pregnancy with visible gestational sac, yolk
sac and embryo but no cardiac activity as before. The gestational
sac and its contents have migrated to the lower uterine segment and
cervix since the previous exam.

## 2020-10-11 ENCOUNTER — Telehealth: Payer: BC Managed Care – PPO | Admitting: Physician Assistant

## 2020-10-11 DIAGNOSIS — R3989 Other symptoms and signs involving the genitourinary system: Secondary | ICD-10-CM

## 2020-10-11 MED ORDER — SULFAMETHOXAZOLE-TRIMETHOPRIM 800-160 MG PO TABS: 1.0000 | ORAL_TABLET | Freq: Two times a day (BID) | ORAL | 0 refills | Status: DC

## 2020-10-11 NOTE — Progress Notes (Signed)
We are sorry that you are not feeling well.  Here is how we plan to help!  Based on what you shared with me it looks like you most likely have a simple urinary tract infection.  A UTI (Urinary Tract Infection) is a bacterial infection of the bladder.  Most cases of urinary tract infections are simple to treat but a key part of your care is to encourage you to drink plenty of fluids and watch your symptoms carefully.  I have prescribed Bactrim DS One tablet twice a day for 5 days.  Your symptoms should gradually improve. Call us if the burning in your urine worsens, you develop worsening fever, back pain or pelvic pain or if your symptoms do not resolve after completing the antibiotic.  Urinary tract infections can be prevented by drinking plenty of water to keep your body hydrated.  Also be sure when you wipe, wipe from front to back and don't hold it in!  If possible, empty your bladder every 4 hours.  Your e-visit answers were reviewed by a board certified advanced clinical practitioner to complete your personal care plan.  Depending on the condition, your plan could have included both over the counter or prescription medications.  If there is a problem please reply  once you have received a response from your provider.  Your safety is important to [place].  If you have drug allergies check your prescription carefully.    You can use MyChart to ask questions about today's visit, request a non-urgent call back, or ask for a work or school excuse for 24 hours related to this e-Visit. If it has been greater than 24 hours you will need to follow up with your provider, or enter a new e-Visit to address those concerns.   You will get an e-mail in the next two days asking about your experience.  I hope that your e-visit has been valuable and will speed your recovery. Thank you for using e-visits.  I provided 7 minutes of non face-to-face time during this encounter for chart review and documentation.

## 2020-10-16 ENCOUNTER — Other Ambulatory Visit: Payer: Self-pay | Admitting: Nurse Practitioner

## 2020-10-17 MED ORDER — FLUCONAZOLE 150 MG PO TABS: 150.0000 mg | ORAL_TABLET | Freq: Once | ORAL | 0 refills | Status: AC

## 2020-10-17 NOTE — Addendum Note (Signed)
Addended by: Victoire Deans, WHITNEY on: 10/17/2020 08:24 AM   Modules accepted: Orders  

## 2020-11-06 ENCOUNTER — Other Ambulatory Visit: Payer: Self-pay | Admitting: Obstetrics and Gynecology

## 2020-11-23 ENCOUNTER — Telehealth: Payer: BC Managed Care – PPO | Admitting: Physician Assistant

## 2020-11-23 DIAGNOSIS — R21 Rash and other nonspecific skin eruption: Secondary | ICD-10-CM

## 2020-11-23 NOTE — Progress Notes (Signed)
Based on what you shared with me, I feel your condition warrants further evaluation and I recommend that you be seen for a face to face visit.  Please contact your primary care physician practice to be seen. Many offices offer virtual options to be seen via video if you are not comfortable going in person to a medical facility at this time.  To correctly diagnose and treat a rash, the provider must be able to see the rash.  If the rash is in a sensitive area, an in person visit with your PCP or at any local urgent care is the best way to ensure you get the best care.  If you do not have a PCP, [facility] offers a free physician referral service available at [phone]. Our trained staff has the experience, knowledge and resources to put you in touch with a physician who is right for you.   You also have the option of a video visit through [email or web address]  If you are having a true medical emergency please call 911.  NOTE: If you entered your credit card information for this eVisit, you will not be charged. You may see a "hold" on your card for the $35 but that hold will drop off and you will not have a charge processed.  Your e-visit answers were reviewed by a board certified advanced clinical practitioner to complete your personal care plan.  Thank you for using e-Visits.  Greater than 5 minutes, yet less than 10 minutes of time have been spent researching, coordinating, and implementing care for this patient today

## 2020-12-01 ENCOUNTER — Telehealth: Payer: BC Managed Care – PPO | Admitting: Physician Assistant

## 2020-12-01 DIAGNOSIS — B379 Candidiasis, unspecified: Secondary | ICD-10-CM

## 2020-12-01 DIAGNOSIS — T3695XA Adverse effect of unspecified systemic antibiotic, initial encounter: Secondary | ICD-10-CM

## 2020-12-01 MED ORDER — FLUCONAZOLE 150 MG PO TABS: 150.0000 mg | ORAL_TABLET | Freq: Once | ORAL | 0 refills | Status: AC

## 2020-12-01 NOTE — Progress Notes (Signed)

## 2020-12-01 NOTE — Progress Notes (Signed)
I have spent 5 minutes in review of e-visit questionnaire, review and updating patient chart, medical decision making and response to patient.   Chukwudi Ewen Cody Zubayr Bednarczyk, PA-C    

## 2021-01-16 ENCOUNTER — Telehealth: Payer: Self-pay | Admitting: Orthopedic Surgery

## 2021-01-16 DIAGNOSIS — N760 Acute vaginitis: Secondary | ICD-10-CM

## 2021-01-16 MED ORDER — FLUCONAZOLE 150 MG PO TABS: 150.0000 mg | ORAL_TABLET | Freq: Once | ORAL | 0 refills | Status: AC

## 2021-01-16 NOTE — Progress Notes (Signed)

## 2021-01-17 ENCOUNTER — Telehealth: Payer: Self-pay | Admitting: Physician Assistant

## 2021-01-17 DIAGNOSIS — R3989 Other symptoms and signs involving the genitourinary system: Secondary | ICD-10-CM

## 2021-01-17 MED ORDER — CEPHALEXIN 500 MG PO CAPS: 500.0000 mg | ORAL_CAPSULE | Freq: Two times a day (BID) | ORAL | 0 refills | Status: DC

## 2021-01-17 NOTE — Progress Notes (Signed)

## 2021-02-17 ENCOUNTER — Telehealth: Payer: Self-pay | Admitting: Physician Assistant

## 2021-02-17 DIAGNOSIS — K047 Periapical abscess without sinus: Secondary | ICD-10-CM

## 2021-02-17 MED ORDER — FLUCONAZOLE 150 MG PO TABS: 150.0000 mg | ORAL_TABLET | Freq: Once | ORAL | 0 refills | Status: AC

## 2021-02-17 MED ORDER — AMOXICILLIN 500 MG PO CAPS: 500.0000 mg | ORAL_CAPSULE | Freq: Three times a day (TID) | ORAL | 0 refills | Status: AC

## 2021-02-17 NOTE — Progress Notes (Signed)

## 2021-02-17 NOTE — Progress Notes (Signed)
I have spent 5 minutes in review of e-visit questionnaire, review and updating patient chart, medical decision making and response to patient.   Jonnatan Hanners Cody Akito Boomhower, PA-C    

## 2021-02-17 NOTE — Addendum Note (Signed)
Addended by: Waldon Merl on: 02/17/2021 08:32 AM   Modules accepted: Orders

## 2021-04-03 ENCOUNTER — Telehealth: Payer: Self-pay | Admitting: Nurse Practitioner

## 2021-04-03 DIAGNOSIS — N760 Acute vaginitis: Secondary | ICD-10-CM

## 2021-04-03 MED ORDER — FLUCONAZOLE 150 MG PO TABS: 150.0000 mg | ORAL_TABLET | Freq: Once | ORAL | 0 refills | Status: AC

## 2021-04-03 NOTE — Progress Notes (Signed)

## 2021-04-19 ENCOUNTER — Emergency Department
Admission: EM | Admit: 2021-04-19 | Discharge: 2021-04-19 | Disposition: A | Discharge status: Home / Self Care | Payer: No Typology Code available for payment source | Attending: Emergency Medicine | Admitting: Emergency Medicine

## 2021-04-19 ENCOUNTER — Emergency Department: Payer: No Typology Code available for payment source

## 2021-04-19 ENCOUNTER — Other Ambulatory Visit: Payer: Self-pay

## 2021-04-19 DIAGNOSIS — Y990 Civilian activity done for income or pay: Secondary | ICD-10-CM | POA: Insufficient documentation

## 2021-04-19 DIAGNOSIS — W01198A Fall on same level from slipping, tripping and stumbling with subsequent striking against other object, initial encounter: Secondary | ICD-10-CM | POA: Insufficient documentation

## 2021-04-19 DIAGNOSIS — S0990XA Unspecified injury of head, initial encounter: Secondary | ICD-10-CM | POA: Diagnosis present

## 2021-04-19 DIAGNOSIS — Z87891 Personal history of nicotine dependence: Secondary | ICD-10-CM | POA: Diagnosis not present

## 2021-04-19 MED ORDER — ONDANSETRON 4 MG PO TBDP
4.0000 mg | ORAL_TABLET | Freq: Once | ORAL | Status: AC
  Administered 2021-04-19: 4 mg via ORAL
  Filled 2021-04-19: qty 1

## 2021-04-19 MED ORDER — ACETAMINOPHEN 325 MG PO TABS
650.0000 mg | ORAL_TABLET | Freq: Once | ORAL | Status: AC
  Administered 2021-04-19: 650 mg via ORAL
  Filled 2021-04-19: qty 2

## 2021-04-19 NOTE — ED Triage Notes (Signed)
Pt states that while at work she was reaching for a tote that weighs approx 18lb that fell forward striking her in the forehead, pt denies loc, pt has a swollen area to the left side of her head, pt states that she has a headache at this time

## 2021-04-19 NOTE — ED Provider Notes (Signed)
Emergency Medicine Provider Triage Evaluation Note  Alexis Berg , a 30 y.o. female  was evaluated in triage.  Pt complains of work-related head injury.  Patient reports of injury to her head after a large tote that weighed approximately 18 pounds fell off of a shelf overhead, striking her in the forehead patient denies any LOC but has an area of soft tissue swelling over the left brow.  She is also reporting headache, nausea, and light sensitivity.  She denies any other injury at this time.  Review of Systems  Positive: Head injury, nausea Negative: Syncope  Physical Exam  BP 112/79 (BP Location: Left Arm)   Pulse 65   Temp 98.7 F (37.1 C) (Oral)   Resp 16   Ht 5' (1.524 m)   Wt 104.3 kg   SpO2 98%   BMI 44.92 kg/m  Gen:   Awake, no distress  NAD Resp:  Normal effort CTA MSK:   Moves extremities without difficulty  Other:  CVS: RRR  Medical Decision Making  Medically screening exam initiated at 1:08 PM.  Appropriate orders placed.  Keshanna Siegert was informed that the remainder of the evaluation will be completed by another provider, this initial triage assessment does not replace that evaluation, and the importance of remaining in the ED until their evaluation is complete.  Patient ED evaluation of a mechanical injury resulting in a head injury.   Lissa Hoard, PA-C 04/19/21 1310    Georga Hacking, MD 04/19/21 1558

## 2021-04-19 NOTE — ED Notes (Signed)
See triage note  presents with head injury   states a heavy tote fell on head

## 2021-04-19 NOTE — ED Notes (Signed)
Per bill, pt's manager, states yes to urine drug screen  [phone]

## 2021-04-19 NOTE — ED Provider Notes (Signed)
[facility]  ____________________________________________   Event Date/Time   First MD Initiated Contact with Patient 04/19/21 1313     (approximate)  I have reviewed the triage vital signs and the nursing notes.   HISTORY  Chief Complaint Head Injury    HPI Alexis Berg is a 30 y.o. female with past medical history of anxiety, GERD, morbid obesity who presents after head injury.  Patient was at work when a tote, which she describes as a piece of luggage, fell onto her head.  Happened around 11 AM.  She denies loss of consciousness.  Since then she has had a left-sided headache.  She denies nausea, vomiting or visual change.  Left eye is somewhat painful.  No neck pain.  She denies any numbness or weakness in her extremities.         Past Medical History:  Diagnosis Date   Anxiety    GERD (gastroesophageal reflux disease)    Heartburn    Morbid obesity (HCC)    MRSA (methicillin resistant staph aureus) culture positive    2013/2014-on right leg/ thigh   Pruritus     Patient Active Problem List   Diagnosis Date Noted   Migraines 09/30/2020   Anxiety 11/09/2018   Heartburn 11/09/2018   Morbid obesity (HCC)    MRSA (methicillin resistant staph aureus) culture positive     Past Surgical History:  Procedure Laterality Date   DILATION AND CURETTAGE OF UTERUS     DILATION AND CURETTAGE OF UTERUS N/A 12/10/2018   Procedure: DILATATION AND CURETTAGE SUCTION;  Surgeon: Vena Austria, MD;  Location: [facility] ORS;  Service: Gynecology;  Laterality: N/A;   INCISION AND DRAINAGE      Prior to Admission medications   Medication Sig Start Date End Date Taking? Authorizing Provider  escitalopram (LEXAPRO) 10 MG tablet Take 1 tablet (10 mg total) by mouth daily. 09/04/19   Vena Austria, MD  ibuprofen (ADVIL) 600 MG tablet Take 1 tablet (600 mg total) by mouth every 8 (eight) hours as needed for moderate pain. 01/08/20   LampteyBritta Mccreedy, MD   estrogens, conjugated, (PREMARIN) 1.25 MG tablet Take 1 tablet (1.25 mg total) by mouth daily for 14 days. 03/28/19 07/15/19  Vena Austria, MD  medroxyPROGESTERone (PROVERA) 10 MG tablet Take 1 tablet (10 mg total) by mouth daily for 10 days. Start after first 14 days of premarin 03/28/19 07/15/19  Vena Austria, MD    Allergies Patient has no known allergies.  Family History  Problem Relation Age of Onset   Breast cancer Mother 106   Diabetes Mother    Thyroid disease Mother    Hypertension Mother    Heart attack Father 25   Parkinson's disease Father    Hypertension Father    Sickle cell anemia Nephew        brother's son    Social History Social History   Tobacco Use   Smoking status: Former    Packs/day: 0.25    Years: 5.00    Pack years: 1.25    Types: Cigarettes    Quit date: 08/13/2016    Years since quitting: 4.6   Smokeless tobacco: Never  Vaping Use   Vaping Use: Never used  Substance Use Topics   Alcohol use: Not Currently    Comment: last alcohol 20 April-2 beer   Drug use: No    Review of Systems   Review of Systems  Eyes:  Positive for photophobia. Negative for visual disturbance.  Musculoskeletal:  Negative for neck pain and neck stiffness.  Neurological:  Positive for headaches. Negative for weakness and numbness.  All other systems reviewed and are negative.  Physical Exam Updated Vital Signs BP 112/79 (BP Location: Left Arm)   Pulse 65   Temp 98.7 F (37.1 C) (Oral)   Resp 16   Ht 5' (1.524 m)   Wt 104.3 kg   SpO2 98%   BMI 44.92 kg/m   Physical Exam Vitals and nursing note reviewed.  Constitutional:      General: She is not in acute distress.    Appearance: Normal appearance.  HENT:     Head: Normocephalic.     Comments: Mild area of swelling of the left frontal bone, no ecchymosis or laceration Eyes:     General: No scleral icterus.    Extraocular Movements: Extraocular movements intact.     Conjunctiva/sclera:  Conjunctivae normal.     Pupils: Pupils are equal, round, and reactive to light.  Neck:     Comments: No C-spine tenderness Pulmonary:     Effort: Pulmonary effort is normal. No respiratory distress.     Breath sounds: No stridor.  Musculoskeletal:        General: No deformity or signs of injury.     Cervical back: Normal range of motion and neck supple.  Skin:    General: Skin is dry.     Coloration: Skin is not jaundiced or pale.  Neurological:     General: No focal deficit present.     Mental Status: She is alert and oriented to person, place, and time. Mental status is at baseline.  Psychiatric:        Mood and Affect: Mood normal.        Behavior: Behavior normal.     LABS (all labs ordered are listed, but only abnormal results are displayed)  Labs Reviewed - No data to display ____________________________________________  EKG  N/a ____________________________________________  RADIOLOGY I, Randol Kern, personally viewed and evaluated these images (plain radiographs) as part of my medical decision making, as well as reviewing the written report by the radiologist.  ED MD interpretation:  I reviewed the CT scan of the brain which does not show any acute intracranial process      ____________________________________________   PROCEDURES  Procedure(s) performed (including Critical Care):  Procedures   ____________________________________________   INITIAL IMPRESSION / ASSESSMENT AND PLAN / ED COURSE     Patient is a 30 year old female presents after a bag fell on her head at work.  Describes the object as fairly heavy.  There was no loss of consciousness.  On exam she does have some swelling of the left frontal bone without other signs of trauma.  She is neurologically intact.  No neck tenderness.  CT head was obtained which is negative for acute injury.  Will treat supportively with NSAIDs.  She is stable for discharge.       ____________________________________________   FINAL CLINICAL IMPRESSION(S) / ED DIAGNOSES  Final diagnoses:  Injury of head, initial encounter     ED Discharge Orders     None        Note:  This document was prepared using Dragon voice recognition software and may include unintentional dictation errors.    Georga Hacking, MD 04/19/21 [phone]

## 2021-04-19 NOTE — Discharge Instructions (Addendum)
Your CAT scan did not show any evidence of brain or skull injury.  You likely bruised the area and you may have a mild concussion.  Please take Tylenol and Motrin for pain.

## 2021-04-27 ENCOUNTER — Telehealth: Payer: Medicaid Other | Admitting: Physician Assistant

## 2021-04-27 DIAGNOSIS — R399 Unspecified symptoms and signs involving the genitourinary system: Secondary | ICD-10-CM

## 2021-04-27 MED ORDER — FLUCONAZOLE 150 MG PO TABS
150.0000 mg | ORAL_TABLET | Freq: Once | ORAL | 0 refills | Status: AC
Start: 2021-04-27 — End: 2021-04-27

## 2021-04-27 MED ORDER — CEPHALEXIN 500 MG PO CAPS: 500.0000 mg | ORAL_CAPSULE | Freq: Two times a day (BID) | ORAL | 0 refills | Status: AC

## 2021-04-27 NOTE — Progress Notes (Signed)
I have spent 5 minutes in review of e-visit questionnaire, review and updating patient chart, medical decision making and response to patient.   Verner Kopischke Cody Harris Kistler, PA-C    

## 2021-04-27 NOTE — Progress Notes (Signed)

## 2021-06-07 ENCOUNTER — Ambulatory Visit
Admission: EM | Admit: 2021-06-07 | Discharge: 2021-06-07 | Discharge status: Against Medical Advice | Payer: Medicaid Other

## 2021-06-07 ENCOUNTER — Other Ambulatory Visit: Payer: Self-pay

## 2021-06-28 ENCOUNTER — Telehealth: Payer: Self-pay | Admitting: Nurse Practitioner

## 2021-06-28 DIAGNOSIS — B379 Candidiasis, unspecified: Secondary | ICD-10-CM

## 2021-06-28 MED ORDER — FLUCONAZOLE 150 MG PO TABS: 150.0000 mg | ORAL_TABLET | Freq: Once | ORAL | 0 refills | Status: AC

## 2021-06-28 NOTE — Progress Notes (Signed)

## 2021-08-06 ENCOUNTER — Telehealth: Payer: Self-pay | Admitting: Nurse Practitioner

## 2021-08-06 DIAGNOSIS — R399 Unspecified symptoms and signs involving the genitourinary system: Secondary | ICD-10-CM

## 2021-08-06 MED ORDER — CEPHALEXIN 500 MG PO CAPS: 500.0000 mg | ORAL_CAPSULE | Freq: Two times a day (BID) | ORAL | 0 refills | Status: DC

## 2021-08-06 NOTE — Progress Notes (Signed)

## 2021-10-04 ENCOUNTER — Telehealth: Payer: Self-pay | Admitting: Physician Assistant

## 2021-10-04 DIAGNOSIS — B3731 Acute candidiasis of vulva and vagina: Secondary | ICD-10-CM

## 2021-10-04 MED ORDER — FLUCONAZOLE 150 MG PO TABS: 150.0000 mg | ORAL_TABLET | Freq: Once | ORAL | 0 refills | Status: AC

## 2021-10-04 NOTE — Progress Notes (Signed)

## 2021-10-04 NOTE — Progress Notes (Signed)
I have spent 5 minutes in review of e-visit questionnaire, review and updating patient chart, medical decision making and response to patient.   Arn Mcomber Cody Hashir Deleeuw, PA-C    

## 2021-11-08 ENCOUNTER — Telehealth: Payer: Self-pay | Admitting: Physician Assistant

## 2021-11-08 DIAGNOSIS — K047 Periapical abscess without sinus: Secondary | ICD-10-CM

## 2021-11-08 MED ORDER — AMOXICILLIN 500 MG PO CAPS: 500.0000 mg | ORAL_CAPSULE | Freq: Three times a day (TID) | ORAL | 0 refills | Status: AC

## 2021-11-08 NOTE — Progress Notes (Signed)

## 2021-11-08 NOTE — Progress Notes (Signed)
I have spent 5 minutes in review of e-visit questionnaire, review and updating patient chart, medical decision making and response to patient.   Genieve Ramaswamy Cody Raffaela Ladley, PA-C    

## 2021-11-24 ENCOUNTER — Encounter: Payer: Self-pay | Admitting: Advanced Practice Midwife

## 2021-11-24 ENCOUNTER — Ambulatory Visit: Payer: Self-pay | Admitting: Advanced Practice Midwife

## 2021-11-24 DIAGNOSIS — Z113 Encounter for screening for infections with a predominantly sexual mode of transmission: Secondary | ICD-10-CM

## 2021-11-24 LAB — WET PREP FOR TRICH, YEAST, CLUE
Trichomonas Exam: NEGATIVE
Yeast Exam: NEGATIVE

## 2021-11-24 LAB — HM HIV SCREENING LAB: HM HIV Screening: NEGATIVE

## 2021-11-24 NOTE — Progress Notes (Signed)
Patient seen for STD screening. Wet prep reviewed, no tx per standing orders. Condoms declined. Patient declines S/S.

## 2021-11-24 NOTE — Progress Notes (Signed)
[facility]  STI clinic/screening visit [address] [place] [id number] [phone]  Subjective:  Alexis DanceJohnele Berg is a 31 y.o. SBF G4P3 exsmoker female being seen today for an STI screening visit. The patient reports they do not have symptoms.  Patient reports that they do not desire a pregnancy in the next year.   They reported they are not interested in discussing contraception today.    Patient's last menstrual period was 11/12/2021 (exact date).   Patient has the following medical conditions:   Patient Active Problem List   Diagnosis Date Noted   Migraines 09/30/2020   Anxiety 11/09/2018   Heartburn 11/09/2018   Morbid obesity (HCC)    MRSA (methicillin resistant staph aureus) culture positive     Chief Complaint  Patient presents with   SEXUALLY TRANSMITTED DISEASE    HPI  Patient reports asymptomatic.  Last HIV test per patient/review of record was 09/17/18 Patient reports last pap was 07/17/18 neg   Screening for MPX risk: Does the patient have an unexplained rash? No Is the patient MSM? No Does the patient endorse multiple sex partners or anonymous sex partners? No Did the patient have close or sexual contact with a person diagnosed with MPX? No Has the patient traveled outside the [place] where MPX is endemic? No Is there a high clinical suspicion for MPX-- evidenced by one of the following No  -Unlikely to be chickenpox  -Lymphadenopathy  -Rash that present in same phase of evolution on any given body part See flowsheet for further details and programmatic requirements.   Immunization history:  Immunization History  Administered Date(s) Administered   Hepatitis B 03/20/2000, 04/24/2000, 09/18/2000   Tdap 04/22/2018     The following portions of the patient's history were reviewed and updated as appropriate: allergies, current medications, past medical history, past social history, past surgical history and problem  list.  Objective:  There were no vitals filed for this visit.  Physical Exam Vitals and nursing note reviewed.  Constitutional:      Appearance: Normal appearance. She is obese.  HENT:     Head: Normocephalic and atraumatic.     Mouth/Throat:     Mouth: Mucous membranes are moist.     Pharynx: Oropharynx is clear. No oropharyngeal exudate or posterior oropharyngeal erythema.  Eyes:     Conjunctiva/sclera: Conjunctivae normal.  Pulmonary:     Effort: Pulmonary effort is normal.  Abdominal:     Palpations: Abdomen is soft. There is no mass.     Tenderness: There is no abdominal tenderness. There is no rebound.     Comments: Soft without masses or tenderness, poor tone  Genitourinary:    General: Normal vulva.     Exam position: Lithotomy position.     Pubic Area: No rash or pubic lice.      Labia:        Right: No rash or lesion.        Left: No rash or lesion.      Vagina: Vaginal discharge (white creamy leukorrhea, ph<4.5) present. No erythema, bleeding or lesions.     Cervix: Normal.     Uterus: Normal.      Adnexa: Right adnexa normal and left adnexa normal.     Rectum: Normal.  Lymphadenopathy:     Head:     Right side of head: No preauricular or posterior auricular adenopathy.     Left side of head: No preauricular or posterior auricular adenopathy.  Cervical: No cervical adenopathy.     Right cervical: No superficial, deep or posterior cervical adenopathy.    Left cervical: No superficial, deep or posterior cervical adenopathy.     Upper Body:     Right upper body: No supraclavicular or axillary adenopathy.     Left upper body: No supraclavicular or axillary adenopathy.     Lower Body: No right inguinal adenopathy. No left inguinal adenopathy.  Skin:    General: Skin is warm and dry.     Findings: No rash.  Neurological:     Mental Status: She is alert and oriented to person, place, and time.     Assessment and Plan:  Alexis Berg is a 31 y.o.  female presenting to the [facility] Department for STI screening  1. Screening examination for venereal disease Please give pt contact info for Milton Ferguson, LCSW Treat wet mount per standing orders Immunization nurse consult - WET PREP FOR [place], YEAST, CLUE - Chlamydia/Gonorrhea [facility] - HIV [facility] - Syphilis Serology, [facility]     No follow-ups on file.  No future appointments.  Herbie Saxon, CNM

## 2021-12-19 ENCOUNTER — Encounter: Payer: Self-pay | Admitting: Emergency Medicine

## 2021-12-19 ENCOUNTER — Emergency Department
Admission: EM | Admit: 2021-12-19 | Discharge: 2021-12-19 | Disposition: A | Discharge status: Home / Self Care | Payer: Self-pay | Attending: Emergency Medicine | Admitting: Emergency Medicine

## 2021-12-19 ENCOUNTER — Other Ambulatory Visit: Payer: Self-pay

## 2021-12-19 DIAGNOSIS — B354 Tinea corporis: Secondary | ICD-10-CM

## 2021-12-19 DIAGNOSIS — B359 Dermatophytosis, unspecified: Secondary | ICD-10-CM | POA: Insufficient documentation

## 2021-12-19 MED ORDER — TERBINAFINE HCL 1 % EX CREA: 1.0000 | TOPICAL_CREAM | Freq: Two times a day (BID) | CUTANEOUS | 0 refills | Status: AC

## 2021-12-19 NOTE — ED Triage Notes (Signed)
Says ringworm on back--being treated for 3 weeks.  No better.

## 2021-12-19 NOTE — ED Provider Notes (Signed)
   [facility] Provider Note    Event Date/Time   First MD Initiated Contact with Patient 12/19/21 [phone]     (approximate)   History   Rash   HPI  Alexis Berg is a 31 y.o. female with history of GERD, anxiety who presents with complaints of ringworm.  Patient reports she has had ringworm on her back for 3 weeks, has tried over-the-counter medications without improvement.  She reports it is itchy, no other complaint     Physical Exam   Triage Vital Signs: ED Triage Vitals  Enc Vitals Group     BP 12/19/21 0909 110/62     Pulse Rate 12/19/21 0909 67     Resp 12/19/21 0909 14     Temp 12/19/21 0909 98.3 F (36.8 C)     Temp Source 12/19/21 0909 Oral     SpO2 12/19/21 0909 98 %     Weight 12/19/21 0910 94.3 kg (208 lb)     Height 12/19/21 0910 1.549 m (5\' 1" )     Head Circumference --      Peak Flow --      Pain Score 12/19/21 0910 0     Pain Loc --      Pain Edu? --      Excl. in GC? --     Most recent vital signs: Vitals:   12/19/21 0909  BP: 110/62  Pulse: 67  Resp: 14  Temp: 98.3 F (36.8 C)  SpO2: 98%     General: Awake, no distress.  CV:  Good peripheral perfusion.  Resp:  Normal effort.  Abd:  No distention.  Other:  Circular rash with raised border to the central back consistent with ringworm   ED Results / Procedures / Treatments   Labs (all labs ordered are listed, but only abnormal results are displayed) Labs Reviewed - No data to display   EKG     RADIOLOGY     PROCEDURES:  Critical Care performed:   Procedures   MEDICATIONS ORDERED IN ED: Medications - No data to display   IMPRESSION / MDM / ASSESSMENT AND PLAN / ED COURSE  I reviewed the triage vital signs and the nursing notes. Patient's presentation is most consistent with acute, uncomplicated illness.   Exam is most consistent with ringworm, will start the patient on terbinafine cream       FINAL CLINICAL IMPRESSION(S) / ED  DIAGNOSES   Final diagnoses:  Ringworm of body     Rx / DC Orders   ED Discharge Orders          Ordered    terbinafine (LAMISIL) 1 % cream  2 times daily        12/19/21 1001             Note:  This document was prepared using Dragon voice recognition software and may include unintentional dictation errors.   12/21/21, MD 12/19/21 [phone]

## 2021-12-19 NOTE — ED Notes (Signed)
See triage note  presents with rash to back  possible ringworm

## 2021-12-21 ENCOUNTER — Ambulatory Visit: Payer: Medicaid Other | Admitting: Licensed Clinical Social Worker
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
## 2021-12-22 ENCOUNTER — Ambulatory Visit: Payer: Medicaid Other | Admitting: Licensed Clinical Social Worker

## 2021-12-28 ENCOUNTER — Ambulatory Visit: Payer: Medicaid Other | Admitting: Licensed Clinical Social Worker

## 2021-12-28 DIAGNOSIS — F411 Generalized anxiety disorder: Secondary | ICD-10-CM
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
## 2022-01-11 ENCOUNTER — Ambulatory Visit: Payer: Medicaid Other | Admitting: Licensed Clinical Social Worker

## 2022-01-12 ENCOUNTER — Ambulatory Visit: Payer: Medicaid Other | Admitting: Licensed Clinical Social Worker

## 2022-01-12 DIAGNOSIS — F411 Generalized anxiety disorder: Secondary | ICD-10-CM

## 2022-01-12 NOTE — Progress Notes (Signed)
Counselor/Therapist Progress Note  Patient ID: Alexis Berg, MRN: [id number],    Date: 01/12/2022  Time Spent: 50 minutes   Treatment Type: Psychotherapy  Reported Symptoms:  low mood, sadness, anxiety, anxious thoughts  Mental Status Exam:  Appearance:   Casual     Behavior:  Appropriate and Sharing  Motor:  Normal  Speech/Language:   Clear and Coherent and Normal Rate  Affect:  Appropriate, Congruent, and Full Range  Mood:  dysthymic  Thought process:  normal  Thought content:    WNL  Sensory/Perceptual disturbances:    WNL  Orientation:  oriented to person, place, time/date, and situation  Attention:  Good  Concentration:  Good  Memory:  WNL  Fund of knowledge:   Good  Insight:    Good  Judgment:   Good  Impulse Control:  Good   Risk Assessment: Danger to Self:  No Self-injurious Behavior: No Danger to Others: No Duty to Warn:no Physical Aggression / Violence:No  Access to Firearms a concern: No  Gang Involvement:No   Subjective: Patient was engaged and cooperative throughout the session using time effectively to discuss thoughts, feelings, goal of treatment and treatment plan. Patient voices continued motivation for treatment and understanding of CBTs. Patient is likely to benefit from future treatment because she is motivated to decrease symptoms and improve functioning.   Interventions: Cognitive Behavioral Therapy  Checked in with patient and reviewed previous session, including assessment and goal of treatment. Reviewed CBTs. Continued to build rapport and explored patient's goal of treatment and worked collaboratively to develop CBTs treatment plan. LCSW taught patient about Radical Acceptance and Grief. Provided support through active listening, validation of feelings, and highlighted patient's strengths.   Diagnosis:   ICD-10-CM   1. Generalized anxiety disorder  F41.1      Plan: Patient's goal of treatment is to learn to be a better individual, to  have more self confidence, trying to let things ago and not worrying about self   Treatment Target: Understand the relationship between thoughts, emotions, and behaviors  Psychoeducation on CBT model   Oriented the client to the therapeutic approach Teach the connection between thoughts, emotions, and behaviors  Treatment Target: Increase realistic balanced thinking -to learn how to replace thinking with thoughts that are more accurate or helpful Explore patient's thoughts, beliefs, automatic thoughts, assumptions  Identify and replace unhelpful thinking patterns (upsetting ideas, self-talk and mental images) Process distress and allow for emotional release  Questioning and challenging thoughts Cognitive reappraisal  Restructuring, Socratic questioning  Provided psychoeducation on core beliefs, explore, and assist patient in identifying core beliefs  Treatment Target: Reducing vulnerability to "emotional mind" "who am I"  Values clarification   Assist patient in developing goal and value directions  Self-care - nutrition, sleep, exercise  Increase positive events  Activity planning  Treatment Target: Increase coping skills Mindfulness and acceptance practices  Intentional breathing Psychoeducation about cognitive fusion Radical Acceptance    Future Appointments  Date Time Provider Department Center  01/26/2022  3:00 PM Kathreen Cosier, LCSW [facility] None    Kathreen Cosier, LCSW

## 2022-01-26 ENCOUNTER — Ambulatory Visit: Payer: Medicaid Other | Admitting: Licensed Clinical Social Worker

## 2022-02-02 ENCOUNTER — Ambulatory Visit: Payer: Medicaid Other | Admitting: Licensed Clinical Social Worker

## 2022-02-23 ENCOUNTER — Telehealth: Payer: Self-pay | Admitting: Physician Assistant

## 2022-02-23 DIAGNOSIS — B3731 Acute candidiasis of vulva and vagina: Secondary | ICD-10-CM

## 2022-02-23 MED ORDER — FLUCONAZOLE 150 MG PO TABS: 150.0000 mg | ORAL_TABLET | Freq: Once | ORAL | 0 refills | Status: AC

## 2022-02-23 NOTE — Progress Notes (Signed)

## 2022-02-23 NOTE — Progress Notes (Signed)
I have spent 5 minutes in review of e-visit questionnaire, review and updating patient chart, medical decision making and response to patient.   Lillyth Spong Cody Jamera Vanloan, PA-C    

## 2022-02-28 ENCOUNTER — Other Ambulatory Visit: Payer: Self-pay

## 2022-02-28 ENCOUNTER — Encounter: Payer: Self-pay | Admitting: Emergency Medicine

## 2022-02-28 ENCOUNTER — Telehealth: Payer: Self-pay | Admitting: Nurse Practitioner

## 2022-02-28 DIAGNOSIS — Y92410 Unspecified street and highway as the place of occurrence of the external cause: Secondary | ICD-10-CM | POA: Diagnosis not present

## 2022-02-28 DIAGNOSIS — S3992XA Unspecified injury of lower back, initial encounter: Secondary | ICD-10-CM | POA: Diagnosis present

## 2022-02-28 DIAGNOSIS — R519 Headache, unspecified: Secondary | ICD-10-CM | POA: Insufficient documentation

## 2022-02-28 DIAGNOSIS — S39012A Strain of muscle, fascia and tendon of lower back, initial encounter: Secondary | ICD-10-CM | POA: Insufficient documentation

## 2022-02-28 DIAGNOSIS — N760 Acute vaginitis: Secondary | ICD-10-CM

## 2022-02-28 MED ORDER — METRONIDAZOLE 500 MG PO TABS: 500.0000 mg | ORAL_TABLET | Freq: Two times a day (BID) | ORAL | 0 refills | Status: AC

## 2022-02-28 NOTE — Progress Notes (Signed)
E-Visit for Vaginal Symptoms  We are sorry that you are not feeling well. Here is how we plan to help! Based on what you shared with me it looks like you: May have a vaginosis due to bacteria  Vaginosis is an inflammation of the vagina that can result in discharge, itching and pain. The cause is usually a change in the normal balance of vaginal bacteria or an infection. Vaginosis can also result from reduced estrogen levels after menopause.  The most common causes of vaginosis are:   Bacterial vaginosis which results from an overgrowth of one on several organisms that are normally present in your vagina.   Yeast infections which are caused by a naturally occurring fungus called candida.   Vaginal atrophy (atrophic vaginosis) which results from the thinning of the vagina from reduced estrogen levels after menopause.   Trichomoniasis which is caused by a parasite and is commonly transmitted by sexual intercourse.  Factors that increase your risk of developing vaginosis include: Medications, such as antibiotics and steroids Uncontrolled diabetes Use of hygiene products such as bubble bath, vaginal spray or vaginal deodorant Douching Wearing damp or tight-fitting clothing Using an intrauterine device (IUD) for birth control Hormonal changes, such as those associated with pregnancy, birth control pills or menopause Sexual activity Having a sexually transmitted infection  Your treatment plan is Metronidazole or Flagyl 500mg twice a day for 7 days.  I have electronically sent this prescription into the pharmacy that you have chosen.  Be sure to take all of the medication as directed. Stop taking any medication if you develop a rash, tongue swelling or shortness of breath. Mothers who are breast feeding should consider pumping and discarding their breast milk while on these antibiotics. However, there is no consensus that infant exposure at these doses would be harmful.  Remember that  medication creams can weaken latex condoms. .   HOME CARE:  Good hygiene may prevent some types of vaginosis from recurring and may relieve some symptoms:  Avoid baths, hot tubs and whirlpool spas. Rinse soap from your outer genital area after a shower, and dry the area well to prevent irritation. Don't use scented or harsh soaps, such as those with deodorant or antibacterial action. Avoid irritants. These include scented tampons and pads. Wipe from front to back after using the toilet. Doing so avoids spreading fecal bacteria to your vagina.  Other things that may help prevent vaginosis include:  Don't douche. Your vagina doesn't require cleansing other than normal bathing. Repetitive douching disrupts the normal organisms that reside in the vagina and can actually increase your risk of vaginal infection. Douching won't clear up a vaginal infection. Use a latex condom. Both female and female latex condoms may help you avoid infections spread by sexual contact. Wear cotton underwear. Also wear pantyhose with a cotton crotch. If you feel comfortable without it, skip wearing underwear to bed. Yeast thrives in moist environments Your symptoms should improve in the next day or two.  GET HELP RIGHT AWAY IF:  You have pain in your lower abdomen ( pelvic area or over your ovaries) You develop nausea or vomiting You develop a fever Your discharge changes or worsens You have persistent pain with intercourse You develop shortness of breath, a rapid pulse, or you faint.  These symptoms could be signs of problems or infections that need to be evaluated by a medical provider now.  MAKE SURE YOU   Understand these instructions. Will watch your condition. Will get help right   away if you are not doing well or get worse.  Thank you for choosing an e-visit.  Your e-visit answers were reviewed by a board certified advanced clinical practitioner to complete your personal care plan. Depending upon the  condition, your plan could have included both over the counter or prescription medications.  Please review your pharmacy choice. Make sure the pharmacy is open so you can pick up prescription now. If there is a problem, you may contact your provider through MyChart messaging and have the prescription routed to another pharmacy.  Your safety is important to us. If you have drug allergies check your prescription carefully.   For the next 24 hours you can use MyChart to ask questions about today's visit, request a non-urgent call back, or ask for a work or school excuse. You will get an email in the next two days asking about your experience. I hope that your e-visit has been valuable and will speed your recovery.   Meds ordered this encounter  Medications   metroNIDAZOLE (FLAGYL) 500 MG tablet    Sig: Take 1 tablet (500 mg total) by mouth 2 (two) times daily for 7 days.    Dispense:  14 tablet    Refill:  0    I spent approximately 5 minutes reviewing the patient's history, current symptoms and coordinating their plan of care today.   
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
## 2022-03-01 ENCOUNTER — Emergency Department: Payer: No Typology Code available for payment source

## 2022-03-01 ENCOUNTER — Emergency Department
Admission: EM | Admit: 2022-03-01 | Discharge: 2022-03-01 | Disposition: A | Discharge status: Home / Self Care | Payer: No Typology Code available for payment source | Attending: Emergency Medicine | Admitting: Emergency Medicine

## 2022-03-01 DIAGNOSIS — S39012A Strain of muscle, fascia and tendon of lower back, initial encounter: Secondary | ICD-10-CM

## 2022-03-01 DIAGNOSIS — R519 Headache, unspecified: Secondary | ICD-10-CM

## 2022-03-01 LAB — POC URINE PREG, ED: Preg Test, Ur: NEGATIVE

## 2022-03-01 MED ORDER — ACETAMINOPHEN 500 MG PO TABS
1000.0000 mg | ORAL_TABLET | Freq: Once | ORAL | Status: AC
  Administered 2022-03-01: 1000 mg via ORAL
  Filled 2022-03-01: qty 2

## 2022-03-01 MED ORDER — KETOROLAC TROMETHAMINE 30 MG/ML IJ SOLN
30.0000 mg | Freq: Once | INTRAMUSCULAR | Status: AC
  Administered 2022-03-01: 30 mg via INTRAMUSCULAR
  Filled 2022-03-01: qty 1

## 2022-03-01 MED ORDER — IBUPROFEN 600 MG PO TABS: 600.0000 mg | ORAL_TABLET | Freq: Four times a day (QID) | ORAL | 0 refills | Status: AC | PRN

## 2022-03-01 MED ORDER — LIDOCAINE 5 % EX PTCH
1.0000 | MEDICATED_PATCH | CUTANEOUS | Status: DC
  Administered 2022-03-01: 1 via TRANSDERMAL
  Filled 2022-03-01: qty 1

## 2022-03-01 MED ORDER — CYCLOBENZAPRINE HCL 5 MG PO TABS: 5.0000 mg | ORAL_TABLET | Freq: Three times a day (TID) | ORAL | 0 refills | Status: AC | PRN
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
## 2022-03-01 NOTE — Discharge Instructions (Addendum)
Your CT imaging did not show any signs for acute injuries.  Some incidental findings were as below.  You can follow-up with the orthopedic doctor if you continue to have back discomfort.  It is possible that you could have had a mild concussion therefore you can follow-up with your primary care doctor for this as well and avoid any sports or things where you could reinjure your head.  You can take Tylenol 1 g every 8 hours and alternate with the ibuprofen.  You can use the Flexeril for muscle spasms but do not drive or work while on this as it can be sedating.  Return to the ER if you develop abdominal pain, fevers or any other concerns  IMPRESSION: 1. No acute traumatic injury within the lumbar spine. 2. 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

## 2022-03-01 NOTE — ED Notes (Signed)
Poct pregnancy Negative   meds given
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
## 2022-04-21 ENCOUNTER — Emergency Department: Payer: Self-pay

## 2022-04-21 ENCOUNTER — Emergency Department
Admission: EM | Admit: 2022-04-21 | Discharge: 2022-04-21 | Disposition: A | Discharge status: Home / Self Care | Payer: Self-pay | Attending: Emergency Medicine | Admitting: Emergency Medicine

## 2022-04-21 ENCOUNTER — Other Ambulatory Visit: Payer: Self-pay

## 2022-04-21 ENCOUNTER — Encounter: Payer: Self-pay | Admitting: *Deleted

## 2022-04-21 DIAGNOSIS — Z20822 Contact with and (suspected) exposure to covid-19: Secondary | ICD-10-CM | POA: Insufficient documentation

## 2022-04-21 DIAGNOSIS — R519 Headache, unspecified: Secondary | ICD-10-CM | POA: Insufficient documentation

## 2022-04-21 DIAGNOSIS — M791 Myalgia, unspecified site: Secondary | ICD-10-CM | POA: Insufficient documentation

## 2022-04-21 DIAGNOSIS — R0602 Shortness of breath: Secondary | ICD-10-CM | POA: Insufficient documentation

## 2022-04-21 LAB — URINALYSIS, ROUTINE W REFLEX MICROSCOPIC
Bacteria, UA: NONE SEEN
Bilirubin Urine: NEGATIVE
Glucose, UA: NEGATIVE mg/dL
Ketones, ur: 5 mg/dL — AB
Leukocytes,Ua: NEGATIVE
Nitrite: NEGATIVE
Protein, ur: NEGATIVE mg/dL
Specific Gravity, Urine: 1.024 (ref 1.005–1.030)
pH: 5 (ref 5.0–8.0)

## 2022-04-21 LAB — COMPREHENSIVE METABOLIC PANEL
ALT: 24 U/L (ref 0–44)
AST: 24 U/L (ref 15–41)
Albumin: 4.7 g/dL (ref 3.5–5.0)
Alkaline Phosphatase: 70 U/L (ref 38–126)
Anion gap: 8 (ref 5–15)
BUN: 29 mg/dL — ABNORMAL HIGH (ref 6–20)
CO2: 22 mmol/L (ref 22–32)
Calcium: 10 mg/dL (ref 8.9–10.3)
Chloride: 108 mmol/L (ref 98–111)
Creatinine, Ser: 0.66 mg/dL (ref 0.44–1.00)
GFR, Estimated: >60 mL/min (ref >60)
Glucose, Bld: 87 mg/dL (ref 70–99)
Potassium: 4.6 mmol/L (ref 3.5–5.1)
Sodium: 138 mmol/L (ref 135–145)
Total Bilirubin: 0.3 mg/dL (ref 0.3–1.2)
Total Protein: 8.5 g/dL — ABNORMAL HIGH (ref 6.5–8.1)

## 2022-04-21 LAB — CBC WITH DIFFERENTIAL/PLATELET
Abs Immature Granulocytes: 0.01 10*3/uL (ref 0.00–0.07)
Basophils Absolute: 0 10*3/uL (ref 0.0–0.1)
Basophils Relative: 0 %
Eosinophils Absolute: 0 10*3/uL (ref 0.0–0.5)
Eosinophils Relative: 0 %
HCT: 44.2 % (ref 36.0–46.0)
Hemoglobin: 14.7 g/dL (ref 12.0–15.0)
Immature Granulocytes: 0 %
Lymphocytes Relative: 34 %
Lymphs Abs: 1.9 10*3/uL (ref 0.7–4.0)
MCH: 32.3 pg (ref 26.0–34.0)
MCHC: 33.3 g/dL (ref 30.0–36.0)
MCV: 97.1 fL (ref 80.0–100.0)
Monocytes Absolute: 0.6 10*3/uL (ref 0.1–1.0)
Monocytes Relative: 10 %
Neutro Abs: 3 10*3/uL (ref 1.7–7.7)
Neutrophils Relative %: 56 %
Platelets: 373 10*3/uL (ref 150–400)
RBC: 4.55 MIL/uL (ref 3.87–5.11)
RDW: 11.8 % (ref 11.5–15.5)
WBC: 5.5 10*3/uL (ref 4.0–10.5)
nRBC: 0 % (ref 0.0–0.2)

## 2022-04-21 LAB — POC URINE PREG, ED: Preg Test, Ur: NEGATIVE

## 2022-04-21 LAB — RESP PANEL BY RT-PCR (FLU A&B, COVID) ARPGX2
Influenza A by PCR: NEGATIVE
Influenza B by PCR: NEGATIVE
SARS Coronavirus 2 by RT PCR: NEGATIVE

## 2022-04-21 LAB — TROPONIN I (HIGH SENSITIVITY): Troponin I (High Sensitivity): 3 ng/L (ref <18)

## 2022-04-21 MED ORDER — METOCLOPRAMIDE HCL 10 MG PO TABS
10.0000 mg | ORAL_TABLET | Freq: Once | ORAL | Status: AC
  Administered 2022-04-21: 10 mg via ORAL
  Filled 2022-04-21: qty 1

## 2022-04-21 MED ORDER — KETOROLAC TROMETHAMINE 15 MG/ML IJ SOLN
15.0000 mg | Freq: Once | INTRAMUSCULAR | Status: AC
  Administered 2022-04-21: 15 mg via INTRAMUSCULAR
  Filled 2022-04-21: qty 1

## 2022-04-21 NOTE — Discharge Instructions (Addendum)
Your COVID and influenza test were negative.  You can take Tylenol Motrin for your headache.  Please return the emergency department if you develop any change in vision numbness weakness your breathing worsens or you develop chest pain.

## 2022-04-21 NOTE — ED Provider Notes (Signed)
[facility] Provider Note    Event Date/Time   First MD Initiated Contact with Patient 04/21/22 1209     (approximate)   History   Shortness of Breath   HPI  Alexis Berg is a 31 y.o. female  with pmh GERD, obesity who presents with headache.  Symptoms been going on for the last 3 days.  Patient endorses both a frontal and posterior HA.  Improves after taking Excedrin.  Denies associated nausea vomiting neck pain.  No preceding trauma.  Denies vision change other than some bright lights in her vision.  No numbness tingling weakness.  Does have history of migraine headaches that this feels similar in quality but is lasting longer.  When she was at work she started to feel short of breath when she was moving boxes but after she stopped feels back to baseline denies any chest pain. The patient denies hx of prior DVT/PE, unilateral leg pain/swelling, hormone use, recent surgery, hx of cancer, prolonged immobilization, or hemoptysis.  Also endorses body aches but no fevers or chills no respiratory symptoms other than the shortness of breath.   Patient Active Problem List   Diagnosis Date Noted   Generalized anxiety disorder 12/28/2021   Migraines 09/30/2020   Anxiety 11/09/2018   Heartburn 11/09/2018   Morbid obesity (HCC)    MRSA (methicillin resistant staph aureus) culture positive      Physical Exam  Triage Vital Signs: ED Triage Vitals  Enc Vitals Group     BP 04/21/22 1106 122/82     Pulse Rate 04/21/22 1106 (!) 107     Resp 04/21/22 1106 16     Temp 04/21/22 1106 98.2 F (36.8 C)     Temp Source 04/21/22 1106 Oral     SpO2 04/21/22 1106 95 %     Weight 04/21/22 1108 208 lb (94.3 kg)     Height 04/21/22 1108 5\' 1"  (1.549 m)     Head Circumference --      Peak Flow --      Pain Score 04/21/22 1107 9     Pain Loc --      Pain Edu? --      Excl. in GC? --     Most recent vital signs: Vitals:   04/21/22 1106  BP: 122/82  Pulse: (!)  107  Resp: 16  Temp: 98.2 F (36.8 C)  SpO2: 95%     General: Awake, no distress.  CV:  Good peripheral perfusion.  No leg swelling or asymmetry Resp:  Normal effort.  Lungs are clear no increased work of breathing Abd:  No distention.  Neuro:             Awake, Alert, Oriented x 3  Other:  Aox3, nml speech  PERRL, EOMI, face symmetric, nml tongue movement  5/5 strength in the BL upper and lower extremities  Sensation grossly intact in the BL upper and lower extremities  Finger-nose-finger intact BL    ED Results / Procedures / Treatments  Labs (all labs ordered are listed, but only abnormal results are displayed) Labs Reviewed  URINALYSIS, ROUTINE W REFLEX MICROSCOPIC - Abnormal; Notable for the following components:      Result Value   Color, Urine YELLOW (*)    APPearance CLEAR (*)    Hgb urine dipstick SMALL (*)    Ketones, ur 5 (*)    All other components within normal limits  COMPREHENSIVE METABOLIC PANEL - Abnormal; Notable for the following  components:   BUN 29 (*)    Total Protein 8.5 (*)    All other components within normal limits  RESP PANEL BY RT-PCR (FLU A&B, COVID) ARPGX2  CBC WITH DIFFERENTIAL/PLATELET  POC URINE PREG, ED  TROPONIN I (HIGH SENSITIVITY)     EKG  EKG interpretation performed by myself: NSR, nml axis, nml intervals, no acute ischemic changes    RADIOLOGY I reviewed and interpreted the CXR which does not show any acute cardiopulmonary process    PROCEDURES:  Critical Care performed: No  Procedures  MEDICATIONS ORDERED IN ED: Medications  ketorolac (TORADOL) 15 MG/ML injection 15 mg (15 mg Intramuscular Given 04/21/22 1349)  metoCLOPramide (REGLAN) tablet 10 mg (10 mg Oral Given 04/21/22 1349)     IMPRESSION / MDM / ASSESSMENT AND PLAN / ED COURSE  I reviewed the triage vital signs and the nursing notes.                              Patient's presentation is most consistent with acute presentation with potential  threat to life or bodily function.  Differential diagnosis includes, but is not limited to, viral illness, migraine headache, tension headache, less likely meningitis/encephalitis, subarachnoid, cerebral venous sinus thrombosis  The patient is a 31 year old female presents with a chief complaint of headache.  Triage note notes that she is primarily here for shortness of breath but she talks to me mainly about her headache this been going on for the last 3 days that is intermittent and with some associated bright floaters in her visual field but no other neurologic symptoms.  Does have a history of migraine and this feels somewhat similar but lasting longer.  Has been aching Excedrin with just temporary relief.  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  Does have some body aches no fevers chills or other respiratory symptoms.  Initial heart rates mildly elevated vitals otherwise reassuring she looks well nontoxic no meningismus neurologic exam is nonfocal I have low suspicion for acute intracranial process.  Chest x-ray is clear EKG nonischemic.  COVID influenza testing is negative.  My suspicion is that this is either migraine versus headache in the setting of viral illness.  Plan to treat with NSAIDs and Reglan.  Did consider PE given her dyspnea and tachycardia however this tachycardia resolved prior to any intervention she has no risk factors and with the other constellation of symptoms I think this is less likely.  Patient feeling improved after Toradol and Reglan.  She is appropriate for discharge.   Clinical Course as of 04/21/22 1434  Fri Apr 21, 2022  1231 DG Chest 2 View [KM]    Clinical Course User Index [KM] Rada Hay, MD     FINAL CLINICAL IMPRESSION(S) / ED DIAGNOSES   Final diagnoses:  Nonintractable headache, unspecified chronicity  pattern, unspecified headache type  Shortness of breath     Rx / DC Orders   ED Discharge Orders     None        Note:  This document was prepared using Dragon voice recognition software and may include unintentional dictation errors.   Rada Hay, MD 04/21/22 1434

## 2022-04-21 NOTE — ED Provider Triage Note (Signed)
Emergency Medicine Provider Triage Evaluation Note  Alexis Berg , a 31 y.o. female  was evaluated in triage.  Pt complains of SOB and headache. Headache x5 days, bitemporal. No vision changes or vomiting. Today while she was at work she developed shortness of breath and mild chest pain. Chest pain seems to have resolved. Reports that she also has bilateral thigh pain. No cough or fever. Currently menstruating. Often gets headaches with her cycles. No neck pain/stiffness/fevers  Patient Active Problem List   Diagnosis Date Noted   Generalized anxiety disorder 12/28/2021   Migraines 09/30/2020   Anxiety 11/09/2018   Heartburn 11/09/2018   Morbid obesity ([place])    MRSA (methicillin resistant staph aureus) culture positive      Review of Systems  Positive: Cp/sob, headache, myalgias Negative: Vision changes, vomiting  Physical Exam  There were no vitals taken for this visit. Gen:   Awake, no distress   Resp:  Normal effort  MSK:   Moves extremities without difficulty  Other:    Medical Decision Making  Medically screening exam initiated at 11:06 AM.  Appropriate orders placed.  Alexis Berg was informed that the remainder of the evaluation will be completed by another provider, this initial triage assessment does not replace that evaluation, and the importance of remaining in the ED until their evaluation is complete.     Marquette Old, PA-C 04/21/22 1110

## 2022-04-21 NOTE — ED Triage Notes (Signed)
Per patient's report, Patient c/o headaches for 5 days and became short of breath at work today. Patient states she was doing her regular activity today when she became short of breath. Patient also c/o feeling "light-headed." Patient reports of feeling of "room spinning."

## 2022-06-17 ENCOUNTER — Emergency Department
Admission: EM | Admit: 2022-06-17 | Discharge: 2022-06-17 | Disposition: A | Discharge status: Home / Self Care | Payer: Medicaid Other | Attending: Emergency Medicine | Admitting: Emergency Medicine

## 2022-06-17 ENCOUNTER — Other Ambulatory Visit: Payer: Self-pay

## 2022-06-17 DIAGNOSIS — M545 Low back pain, unspecified: Secondary | ICD-10-CM | POA: Diagnosis present

## 2022-06-17 LAB — URINALYSIS, ROUTINE W REFLEX MICROSCOPIC
Bilirubin Urine: NEGATIVE
Glucose, UA: NEGATIVE mg/dL
Hgb urine dipstick: NEGATIVE
Ketones, ur: NEGATIVE mg/dL
Leukocytes,Ua: NEGATIVE
Nitrite: NEGATIVE
Protein, ur: NEGATIVE mg/dL
Specific Gravity, Urine: 1.026 (ref 1.005–1.030)
pH: 6 (ref 5.0–8.0)

## 2022-06-17 LAB — POC URINE PREG, ED: Preg Test, Ur: NEGATIVE

## 2022-06-17 MED ORDER — CYCLOBENZAPRINE HCL 5 MG PO TABS: 5.0000 mg | ORAL_TABLET | Freq: Three times a day (TID) | ORAL | 0 refills | Status: DC | PRN

## 2022-06-17 MED ORDER — HYDROCODONE-ACETAMINOPHEN 5-325 MG PO TABS: 1.0000 | ORAL_TABLET | Freq: Four times a day (QID) | ORAL | 0 refills | Status: DC | PRN

## 2022-06-17 MED ORDER — IBUPROFEN 600 MG PO TABS: 600.0000 mg | ORAL_TABLET | Freq: Three times a day (TID) | ORAL | 0 refills | Status: DC | PRN

## 2022-06-17 MED ORDER — HYDROCODONE-ACETAMINOPHEN 5-325 MG PO TABS
1.0000 | ORAL_TABLET | Freq: Once | ORAL | Status: AC
  Administered 2022-06-17: 1 via ORAL
  Filled 2022-06-17: qty 1

## 2022-06-17 MED ORDER — CYCLOBENZAPRINE HCL 10 MG PO TABS
5.0000 mg | ORAL_TABLET | Freq: Once | ORAL | Status: AC
  Administered 2022-06-17: 5 mg via ORAL
  Filled 2022-06-17: qty 1

## 2022-06-17 NOTE — ED Notes (Signed)
62 yof with a c/c of lower back pain for 2 days. The pt advised her pain is also radiating to the front and she has had some pressure while urinating.

## 2022-06-17 NOTE — ED Notes (Signed)
E signature pad not working 

## 2022-06-17 NOTE — ED Notes (Signed)
Patient declined discharge vital signs. 
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
## 2022-06-17 NOTE — Discharge Instructions (Addendum)
Follow-up with one of the clinics on the list provided for you on your discharge papers.  A prescription for Flexeril to relax your muscles, hydrocodone and ibuprofen was sent to the pharmacy.  You may also use ice or heat to your back as needed for discomfort.  Do not drive or operate machinery while taking the muscle relaxant and pain medication as both could cause drowsiness and increase your risk for injury.    Please go to the following website to schedule new (and existing) patient appointments:   [email or web address]   The following is a list of primary care offices in the area who are accepting new patients at this time.  Please reach out to one of them directly and let them know you would like to schedule an appointment to follow up on an Emergency Department visit, and/or to establish a new primary care provider (PCP).  There are likely other primary care clinics in the are who are accepting new patients, but this is an excellent place to start:  [facility] Lead physician: Dr Shirlee Latch [address] [place], [place] [address] [phone]  [facility] Lead Physician: Dr Alba Cory [address], [place], [place] [address] [phone]  [facility]  Lead Physician: Dr Olevia Perches [address] [place], [place] [address] [phone]  [facility] Lead Physician: Dr Sofie Hartigan [address], [place], [place] [address] [phone]  [facility] Primary [facility] & [facility] Medicine at [facility] Lead Physician: Dr Bari Edward [address] [place], [place], [place] [address] [phone]

## 2022-08-14 ENCOUNTER — Telehealth: Payer: Medicaid Other | Admitting: Physician Assistant

## 2022-08-14 DIAGNOSIS — L739 Follicular disorder, unspecified: Secondary | ICD-10-CM

## 2022-08-14 DIAGNOSIS — B379 Candidiasis, unspecified: Secondary | ICD-10-CM

## 2022-08-14 DIAGNOSIS — B354 Tinea corporis: Secondary | ICD-10-CM

## 2022-08-14 DIAGNOSIS — T3695XA Adverse effect of unspecified systemic antibiotic, initial encounter: Secondary | ICD-10-CM | POA: Diagnosis not present

## 2022-08-14 MED ORDER — TERBINAFINE HCL 250 MG PO TABS: 250.0000 mg | ORAL_TABLET | Freq: Every day | ORAL | 0 refills | Status: DC

## 2022-08-14 MED ORDER — FLUCONAZOLE 150 MG PO TABS: 150.0000 mg | ORAL_TABLET | ORAL | 0 refills | Status: DC | PRN

## 2022-08-14 MED ORDER — DOXYCYCLINE HYCLATE 100 MG PO TABS: 100.0000 mg | ORAL_TABLET | Freq: Two times a day (BID) | ORAL | 0 refills | Status: DC

## 2022-08-14 NOTE — Patient Instructions (Signed)
Esaw Dace, thank you for joining Mar Daring, PA-C for today's virtual visit.  While this provider is not your primary care provider (PCP), if your PCP is located in our provider database this encounter information will be shared with them immediately following your visit.   [place] account gives you access to today's visit and all your visits, tests, and labs performed at [facility] " click here if you don't have a [place] account or go to [email or web address]  Consent: (Patient) Esaw Dace provided verbal consent for this virtual visit at the beginning of the encounter.  Current Medications:  Current Outpatient Medications:    doxycycline (VIBRA-TABS) 100 MG tablet, Take 1 tablet (100 mg total) by mouth 2 (two) times daily., Disp: 20 tablet, Rfl: 0   fluconazole (DIFLUCAN) 150 MG tablet, Take 1 tablet (150 mg total) by mouth every 3 (three) days as needed., Disp: 2 tablet, Rfl: 0   terbinafine (LAMISIL) 250 MG tablet, Take 1 tablet (250 mg total) by mouth daily., Disp: 14 tablet, Rfl: 0   ibuprofen (ADVIL) 600 MG tablet, Take 1 tablet (600 mg total) by mouth every 8 (eight) hours as needed for mild pain or moderate pain., Disp: 21 tablet, Rfl: 0   Medications ordered in this encounter:  Meds ordered this encounter  Medications   terbinafine (LAMISIL) 250 MG tablet    Sig: Take 1 tablet (250 mg total) by mouth daily.    Dispense:  14 tablet    Refill:  0    Order Specific Question:   Supervising Provider    Answer:   Chase Picket [id number]   doxycycline (VIBRA-TABS) 100 MG tablet    Sig: Take 1 tablet (100 mg total) by mouth 2 (two) times daily.    Dispense:  20 tablet    Refill:  0    Order Specific Question:   Supervising Provider    Answer:   Chase Picket [id number]   fluconazole (DIFLUCAN) 150 MG tablet    Sig: Take 1 tablet (150 mg total) by mouth every 3 (three) days as needed.    Dispense:  2  tablet    Refill:  0    Order Specific Question:   Supervising Provider    Answer:   Chase Picket [id number]     *If you need refills on other medications prior to your next appointment, please contact your pharmacy*  Follow-Up: Call back or seek an in-person evaluation if the symptoms worsen or if the condition fails to improve as anticipated.  [place] [phone]  Other Instructions  Folliculitis  Folliculitis occurs when hair follicles become inflamed. A hair follicle is a tiny opening in your skin where your hair grows from. This condition often occurs on the scalp, thighs, legs, back, and buttocks but can happen anywhere on the body. What are the causes? A common cause of this condition is an infection from bacteria. The type of folliculitis caused by bacteria can last a long time or go away and come back. The bacteria can live anywhere on your skin. They are often found in the nostrils. Other causes may include: An infection from a fungus. An infection from a virus. Your skin touching some chemicals, such as oils and tars. Shaving or waxing. Greasy ointments or creams put on the skin. What increases the risk? You are more likely to develop this condition if: Your body has a weak disease-fighting system (immune system). You have  diabetes. You are obese. What are the signs or symptoms? Symptoms of this condition include: Redness. Soreness. Swelling. Itching. Small white or yellow, itchy spots filled with pus (pustules) that appear over a red area. If the infection goes deep into the follicle, these may turn into a boil (furuncle). A group of boils (carbuncle). These tend to form in hairy, sweaty areas of the body. How is this diagnosed? This condition is diagnosed with a skin exam. Your health care provider may take a sample of one of the pustules or boils to test in a lab. How is this treated? This condition may be treated by: Putting a warm,  wet cloth (warm compress) on the affected areas. Taking antibiotics or applying them to the skin. Applying or bathing with a solution that kills germs (antiseptic). Taking an over-the-counter medicine. This can help with itching. Having a procedure to drain pustules or boils. This may be done if a pustule or boil contains a lot of pus or fluid. Having laser hair removal. This may be done when the condition lasts for a long time. Follow these instructions at home: Managing pain and swelling  If directed, apply heat to the affected area as often as told by your health care provider. Use the heat source that your health care provider recommends, such as a moist heat pack or a heating pad. Place a towel between your skin and the heat source. Leave the heat on for 20-30 minutes. If your skin turns bright red, remove the heat right away to prevent burns. The risk of burns is higher if you cannot feel pain, heat, or cold. General instructions Take over-the-counter and prescription medicines only as told by your health care provider. If you were prescribed antibiotics, take or apply them as told by your health care provider. Do not stop using the antibiotic even if you start to feel better. Check your irritated area every day for signs of infection. Check for: More redness, swelling, or pain. Fluid or blood. Warmth. Pus or a bad smell. Do not shave irritated skin. Keep all follow-up visits. Your health care provider will check if the treatments are helping. Contact a health care provider if: You have a fever. You have any signs of infection. Red streaks are spreading from the affected area. This information is not intended to replace advice given to you by your health care provider. Make sure you discuss any questions you have with your health care provider. Document Revised: 11/22/2021 Document Reviewed: 11/22/2021 Elsevier Patient Education  [place].  Body Ringworm Body ringworm  is an infection of the skin that often causes a ring-shaped rash. Body ringworm is also called tinea corporis. Body ringworm can affect any part of your skin. This condition is easily spread from person to person (is very contagious). What are the causes? This condition is caused by fungi called dermatophytes. The condition develops when these fungi grow out of control on the skin. You can get this condition if you touch a person or animal that has it. You can also get it if you share any items with an infected person or pet. These include: Clothing, bedding, and towels. Brushes or combs. Gym equipment. Any other object that has the fungus on it. What increases the risk? You are more likely to develop this condition if you: Play sports that involve close physical contact, such as wrestling. Sweat a lot. Live in areas that are hot and humid. Use public showers. Have a weakened disease-fighting system (  immune system). What are the signs or symptoms? Symptoms of this condition include: Itchy, raised red spots and bumps. Red scaly patches. A ring-shaped rash. The rash may have: A clear center. Scales or red bumps at its center. Redness near its borders. Dry and scaly skin on or around it. How is this diagnosed? This condition can usually be diagnosed with a skin exam. A skin scraping may be taken from the affected area and examined under a microscope to see if the fungus is present. How is this treated? This condition may be treated with: An antifungal cream or ointment. An antifungal shampoo. Antifungal medicines. These may be prescribed if your ringworm: Is severe. Keeps coming back or lasts a long time. Follow these instructions at home: Take over-the-counter and prescription medicines only as told by your health care provider. If you were given an antifungal cream or ointment: Use it as told by your health care provider. Wash the infected area and dry it completely before  applying the cream or ointment. If you were given an antifungal shampoo: Use it as told by your health care provider. Leave the shampoo on your body for 3-5 minutes before rinsing. While you have a rash: Wear loose clothing to stop clothes from rubbing and irritating it. Wash or change your bed sheets every night. Wash clothes and bed sheets in hot water. Disinfect or throw out items that may be infected. Wash your hands often with soap and water for at least 20 seconds. If soap and water are not available, use hand sanitizer. If your pet has the same infection, take your pet to see a veterinarian for treatment. How is this prevented? Take a bath or shower every day and after every time you work out or play sports. Dry your skin completely after bathing. Wear sandals or shoes in public places and showers. Wash athletic clothes after each use. Do not share personal items with others. Avoid touching red patches of skin on other people. Avoid touching pets that have bald spots. If you touch an animal that has a bald spot, wash your hands. Contact a health care provider if: Your rash continues to spread after 7 days of treatment. Your rash is not gone in 4 weeks. The area around your rash gets red, warm, tender, and swollen. This information is not intended to replace advice given to you by your health care provider. Make sure you discuss any questions you have with your health care provider. Document Revised: 12/01/2021 Document Reviewed: 12/01/2021 Elsevier Patient Education  [place].     If you have been instructed to have an in-person evaluation today at a local Urgent Care facility, please use the link below. It will take you to a list of all of our available [facility], including address, phone number and hours of operation. Please do not delay care.  [facility]  If you or a family member do not have a primary care provider, use the link  below to schedule a visit and establish care. When you choose a [facility] primary care physician or advanced practice provider, you gain a long-term partner in health. Find a Primary Care Provider  Learn more about [facility] in-office and virtual care options: [facility]

## 2022-08-14 NOTE — Progress Notes (Signed)
Virtual Visit Consent   Alexis Berg, you are scheduled for a virtual visit with a [facility] provider today. Just as with appointments in the office, your consent must be obtained to participate. Your consent will be active for this visit and any virtual visit you may have with one of our providers in the next 365 days. If you have a MyChart account, a copy of this consent can be sent to you electronically.  As this is a virtual visit, video technology does not allow for your provider to perform a traditional examination. This may limit your provider's ability to fully assess your condition. If your provider identifies any concerns that need to be evaluated in person or the need to arrange testing (such as labs, EKG, etc.), we will make arrangements to do so. Although advances in technology are sophisticated, we cannot ensure that it will always work on either your end or our end. If the connection with a video visit is poor, the visit may have to be switched to a telephone visit. With either a video or telephone visit, we are not always able to ensure that we have a secure connection.  By engaging in this virtual visit, you consent to the provision of healthcare and authorize for your insurance to be billed (if applicable) for the services provided during this visit. Depending on your insurance coverage, you may receive a charge related to this service.  I need to obtain your verbal consent now. Are you willing to proceed with your visit today? Alexis Berg has provided verbal consent on 08/14/2022 for a virtual visit (video or telephone). Mar Daring, PA-C  Date: 08/14/2022 2:57 PM  Virtual Visit via Video Note   I, Mar Daring, connected with  Alexis Berg  ([id number], [birth year]) on 08/14/22 at  2:45 PM EST by a video-enabled telemedicine application and verified that I am speaking with the correct person using two identifiers.  Location: Patient: Virtual  Visit Location Patient: Home Provider: Virtual Visit Location Provider: Home Office   I discussed the limitations of evaluation and management by telemedicine and the availability of in person appointments. The patient expressed understanding and agreed to proceed.    History of Present Illness: Alexis Berg is a 32 y.o. who identifies as a female who was assigned female at birth, and is being seen today for face bumps. Shaved her face about a week ago and now has bumps on face that burn.   Reports she has been using a cream, terbinafine, for ring worm. Ring worm is located on back. Reports she has been using it for over 2 months with no relief.   Problems:  Patient Active Problem List   Diagnosis Date Noted   Generalized anxiety disorder 12/28/2021   Migraines 09/30/2020   Anxiety 11/09/2018   Heartburn 11/09/2018   Morbid obesity (HCC)    MRSA (methicillin resistant staph aureus) culture positive     Allergies: No Known Allergies Medications:  Current Outpatient Medications:    doxycycline (VIBRA-TABS) 100 MG tablet, Take 1 tablet (100 mg total) by mouth 2 (two) times daily., Disp: 20 tablet, Rfl: 0   fluconazole (DIFLUCAN) 150 MG tablet, Take 1 tablet (150 mg total) by mouth every 3 (three) days as needed., Disp: 2 tablet, Rfl: 0   terbinafine (LAMISIL) 250 MG tablet, Take 1 tablet (250 mg total) by mouth daily., Disp: 14 tablet, Rfl: 0   ibuprofen (ADVIL) 600 MG tablet, Take 1 tablet (600 mg total) by mouth every  8 (eight) hours as needed for mild pain or moderate pain., Disp: 21 tablet, Rfl: 0  Observations/Objective: Patient is well-developed, well-nourished in no acute distress.  Resting comfortably at home.  Head is normocephalic, atraumatic.  No labored breathing.  Speech is clear and coherent with logical content.  Patient is alert and oriented at baseline.  Fine papular and vesicular lesions noted on face bilaterally around mouth and cheeks with  R>L   Assessment and Plan: 1. Folliculitis - doxycycline (VIBRA-TABS) 100 MG tablet; Take 1 tablet (100 mg total) by mouth 2 (two) times daily.  Dispense: 20 tablet; Refill: 0  2. Ringworm of body - terbinafine (LAMISIL) 250 MG tablet; Take 1 tablet (250 mg total) by mouth daily.  Dispense: 14 tablet; Refill: 0  3. Antibiotic-induced yeast infection - fluconazole (DIFLUCAN) 150 MG tablet; Take 1 tablet (150 mg total) by mouth every 3 (three) days as needed.  Dispense: 2 tablet; Refill: 0  - Doxycycline for suspected folliculitis of the face - Sensitive skin or gentle face wash (aveeno, ceraVe) - Gentle washing twice daily - Cool compresses as needed - Good facial sensitive moisturizer  - Terbinafine for ring worm on back that has not cleared with topical treatment   - Diflucan given as prophylaxis as patient tends to get vaginal yeast infections with antibiotic use.  - Follow up in person if symptoms persist or fail to improve  Follow Up Instructions: I discussed the assessment and treatment plan with the patient. The patient was provided an opportunity to ask questions and all were answered. The patient agreed with the plan and demonstrated an understanding of the instructions.  A copy of instructions were sent to the patient via MyChart unless otherwise noted below.    The patient was advised to call back or seek an in-person evaluation if the symptoms worsen or if the condition fails to improve as anticipated.  Time:  I spent 15 minutes with the patient via telehealth technology discussing the above problems/concerns.    Mar Daring, PA-C

## 2022-10-31 ENCOUNTER — Telehealth: Payer: Medicaid Other | Admitting: Family Medicine

## 2022-10-31 DIAGNOSIS — L709 Acne, unspecified: Secondary | ICD-10-CM | POA: Diagnosis not present

## 2022-10-31 MED ORDER — CLINDAMYCIN PHOSPHATE 1 % EX SOLN: Freq: Two times a day (BID) | CUTANEOUS | 0 refills | Status: DC

## 2022-10-31 NOTE — Progress Notes (Signed)
E-Visit for Acne  We are sorry that you are experiencing this issue.  Here is how we plan to help!  Based on what you shared with me it looks like you have cystic acne.  Acne is a disorder of the hair follicles and oil glands (sebaceous glands). The sebaceous glands secrete oils to keep the skin moist.  When the glands get clogged, it can lead to pimples or cysts.  These cysts may become infected and leave scars. Acne is very common and normally occurs at puberty.  Acne is also inherited.  Your personal care plan consists of the following recommendations:  I recommend that you use a daily cleanser  You might try an over the counter cleanser that has benzoyl peroxide.  I recommend that you start with a product that has 2.5% benzoyl peroxide.  Stronger concentrations have not been shown to be more effective.  I have prescribed a topical gel with an antibiotic:  Clindamycin 1% lotion.  Apply the lotion to the affected skin twice daily.  Be sure to read the package insert to understand potential side effects,   If excessive dryness or peeling occurs, reduce dose frequency or concentration of the topical scrubs.  If excessive stinging or burning occurs, remove the topical gel with mild soap and water and resume at a lower dose the next day.  Remember oral antibiotics and topical acne treatments may increase your sensitivity to the sun!  HOME CARE: Do not squeeze pimples because that can often lead to infections, worse acne, and scars. Use a moisturizer that contains retinoid or fruit acids that may inhibit the development of new acne lesions. Although there is not a clear link that foods can cause acne, doctors do believe that too many sweets predispose you to skin problems.  GET HELP RIGHT AWAY IF: If your acne gets worse or is not better within 10 days. If you become depressed. If you become pregnant, discontinue medications and call your OB/GYN.  MAKE SURE YOU: Understand these  instructions. Will watch your condition. Will get help right away if you are not doing well or get worse.  Thank you for choosing an e-visit.  Your e-visit answers were reviewed by a board certified advanced clinical practitioner to complete your personal care plan. Depending upon the condition, your plan could have included both over the counter or prescription medications.  Please review your pharmacy choice. Make sure the pharmacy is open so you can pick up prescription now. If there is a problem, you may contact your provider through [employer] and have the prescription routed to another pharmacy.  Your safety is important to [place]. If you have drug allergies check your prescription carefully.   For the next 24 hours you can use MyChart to ask questions about today's visit, request a non-urgent call back, or ask for a work or school excuse. You will get an email in the next two days asking about your experience. I hope that your e-visit has been valuable and will speed your recovery.  I provided 5 minutes of non face-to-face time during this encounter for chart review, medication and order placement, as well as and documentation.

## 2022-11-02 ENCOUNTER — Telehealth: Payer: Medicaid Other | Admitting: Physician Assistant

## 2022-11-02 DIAGNOSIS — B3731 Acute candidiasis of vulva and vagina: Secondary | ICD-10-CM

## 2022-11-02 MED ORDER — FLUCONAZOLE 150 MG PO TABS: 150.0000 mg | ORAL_TABLET | ORAL | 0 refills | Status: DC | PRN

## 2022-11-02 NOTE — Progress Notes (Signed)

## 2022-11-20 ENCOUNTER — Ambulatory Visit
Admission: EM | Admit: 2022-11-20 | Discharge: 2022-11-20 | Disposition: A | Discharge status: Home / Self Care | Payer: 59 | Attending: Internal Medicine | Admitting: Internal Medicine

## 2022-11-20 DIAGNOSIS — L02214 Cutaneous abscess of groin: Secondary | ICD-10-CM | POA: Diagnosis not present

## 2022-11-20 DIAGNOSIS — Z8614 Personal history of Methicillin resistant Staphylococcus aureus infection: Secondary | ICD-10-CM

## 2022-11-20 MED ORDER — BACITRACIN ZINC 500 UNIT/GM EX OINT: TOPICAL_OINTMENT | CUTANEOUS | 0 refills | Status: AC

## 2022-11-20 MED ORDER — SULFAMETHOXAZOLE-TRIMETHOPRIM 800-160 MG PO TABS: ORAL_TABLET | ORAL | 0 refills | Status: DC

## 2022-11-20 NOTE — ED Provider Notes (Signed)
[facility]    CSN: [id number] Arrival date & time: 11/20/22  1052      History   Chief Complaint No chief complaint on file.   HPI Alexis Berg is a 32 y.o. female who presents  with multiple abscesses on pelvic areas for the past 4 weeks. None have drained. Denies hx of fever. They get painful and the last ones that have developed are the ones on R groin region. Had one on her L labia prior to that time. The first one was in her R upper arm and resolved, and the rest came gradually.  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     Past Medical History:  Diagnosis Date   Anxiety    GERD (gastroesophageal reflux disease)    Heartburn    Morbid obesity (HCC)    MRSA (methicillin resistant staph aureus) culture positive    2013/2014-on right leg/ thigh   Pruritus     Patient Active Problem List   Diagnosis Date Noted   Generalized anxiety disorder 12/28/2021   Migraines 09/30/2020   Anxiety 11/09/2018   Heartburn 11/09/2018   Morbid obesity (HCC)    MRSA (methicillin resistant staph aureus) culture positive     Past Surgical History:  Procedure Laterality Date   DILATION AND CURETTAGE OF UTERUS     DILATION AND CURETTAGE OF UTERUS N/A 12/10/2018   Procedure: DILATATION AND CURETTAGE SUCTION;  Surgeon: Vena Austria, MD;  Location: [facility];  Service: Gynecology;  Laterality: N/A;   INCISION AND DRAINAGE      OB History     Gravida  5   Para  3   Term  3   Preterm      AB  1   Living  3      SAB  0   IAB  1   Ectopic      Multiple      Live Births  3            Home Medications    Prior to Admission medications   Medication Sig Start Date End Date Taking? Authorizing Provider  bacitracin ointment Apply with Q-tip in each nostril daily x 1 month 11/20/22  Yes Rodriguez-Southworth, Nettie Elm, PA-C  sulfamethoxazole-trimethoprim (BACTRIM DS) 800-160 MG tablet 2 bid x 7 days 11/20/22   Yes Rodriguez-Southworth, Nettie Elm, PA-C  fluconazole (DIFLUCAN) 150 MG tablet Take 1 tablet (150 mg total) by mouth every 3 (three) days as needed. 11/02/22   Margaretann Loveless, PA-C  ibuprofen (ADVIL) 600 MG tablet Take 1 tablet (600 mg total) by mouth every 8 (eight) hours as needed for mild pain or moderate pain. 06/17/22   Tommi Rumps, PA-C  terbinafine (LAMISIL) 250 MG tablet Take 1 tablet (250 mg total) by mouth daily. 08/14/22   Margaretann Loveless, PA-C  estrogens, conjugated, (PREMARIN) 1.25 MG tablet Take 1 tablet (1.25 mg total) by mouth daily for 14 days. 03/28/19 07/15/19  Vena Austria, MD  medroxyPROGESTERone (PROVERA) 10 MG tablet Take 1 tablet (10 mg total) by mouth daily for 10 days. Start after first 14 days of premarin 03/28/19 07/15/19  Vena Austria, MD    Family History Family History  Problem Relation Age of Onset   Breast cancer Mother 82   Diabetes Mother    Thyroid disease Mother    Hypertension Mother    Heart attack Father 17   Parkinson's disease Father  Hypertension Father    Sickle cell anemia Nephew        brother's son    Social History Social History   Tobacco Use   Smoking status: Former    Packs/day: 0.25    Years: 5.00    Additional pack years: 0.00    Total pack years: 1.25    Types: Cigarettes    Quit date: 08/13/2016    Years since quitting: 6.2   Smokeless tobacco: Never  Vaping Use   Vaping Use: Never used  Substance Use Topics   Alcohol use: Not Currently    Alcohol/week: 1.0 standard drink of alcohol    Types: 1 Standard drinks or equivalent per week    Comment: last use 08/2021 "not often"   Drug use: No     Allergies   Patient has no known allergies.   Review of Systems Review of Systems As noted in HPI  Physical Exam Triage Vital Signs ED Triage Vitals  Enc Vitals Group     BP 11/20/22 1157 134/78     Pulse Rate 11/20/22 1157 72     Resp --      Temp 11/20/22 1157 98.4 F (36.9 C)     Temp Source  11/20/22 1157 Oral     SpO2 11/20/22 1157 100 %     Weight --      Height --      Head Circumference --      Peak Flow --      Pain Score 11/20/22 1156 8     Pain Loc --      Pain Edu? --      Excl. in GC? --    No data found.  Updated Vital Signs BP 134/78 (BP Location: Left Arm)   Pulse 72   Temp 98.4 F (36.9 C) (Oral)   LMP 11/13/2022 (Approximate)   SpO2 100%   Visual Acuity Right Eye Distance:   Left Eye Distance:   Bilateral Distance:    Right Eye Near:   Left Eye Near:    Bilateral Near:     Physical Exam Vitals and nursing note reviewed.  Constitutional:      General: She is not in acute distress.    Appearance: She is obese. She is not toxic-appearing.  HENT:     Right Ear: External ear normal.     Left Ear: External ear normal.  Eyes:     General: No scleral icterus.    Conjunctiva/sclera: Conjunctivae normal.  Pulmonary:     Effort: Pulmonary effort is normal.  Abdominal:     Comments: R groin area with 2 1/2 cm x 1/2 cm each in size which are indurated. NO erythema present.   Genitourinary:    General: Normal vulva.  Musculoskeletal:        General: Normal range of motion.     Cervical back: Neck supple.  Neurological:     Mental Status: She is alert and oriented to person, place, and time.     Gait: Gait normal.  Psychiatric:        Mood and Affect: Mood normal.        Behavior: Behavior normal.        Thought Content: Thought content normal.        Judgment: Judgment normal.      UC Treatments / Results  Labs (all labs ordered are listed, but only abnormal results are displayed) Labs Reviewed - No data to display  EKG  Radiology No results found.  Procedures Procedures (including critical care time)  Medications Ordered in UC Medications - No data to display  Initial Impression / Assessment and Plan / UC Course  I have reviewed the triage vital signs and the nursing notes.  Abscess R groin region  I placed her on  Bactrim as noted And bacitracin for her nose for prevention as noted.     Final Clinical Impressions(s) / UC Diagnoses   Final diagnoses:  Abscess of groin, right  History of MRSA infection     Discharge Instructions      You may apply heat on the boils on your R groin for 20 minutes 3-4 times a day for 3-5 days. Wash your skin with Hibiclens daily Any time you touch them, you need to wash your hands right away        ED Prescriptions     Medication Sig Dispense Auth. Provider   sulfamethoxazole-trimethoprim (BACTRIM DS) 800-160 MG tablet 2 bid x 7 days 28 tablet Rodriguez-Southworth, Reign Dziuba, PA-C   bacitracin ointment Apply with Q-tip in each nostril daily x 1 month 113 g Rodriguez-Southworth, Nettie Elm, PA-C      PDMP not reviewed this encounter.   Garey Ham, Cordelia Poche 11/20/22 1219

## 2022-11-20 NOTE — Discharge Instructions (Addendum)
You may apply heat on the boils on your R groin for 20 minutes 3-4 times a day for 3-5 days. Wash your skin with Hibiclens daily Any time you touch them, you need to wash your hands right away

## 2022-11-20 NOTE — ED Triage Notes (Signed)
Pt presents to UC c/o x several boils on pelvic area and on bottom onset x4 weeks ago, pt states none of them are draining but they are painful. Pt denies any fevers.

## 2022-11-29 ENCOUNTER — Telehealth: Payer: Self-pay | Admitting: Physician Assistant

## 2022-11-29 DIAGNOSIS — B3731 Acute candidiasis of vulva and vagina: Secondary | ICD-10-CM

## 2022-11-29 MED ORDER — FLUCONAZOLE 150 MG PO TABS: 150.0000 mg | ORAL_TABLET | Freq: Once | ORAL | 0 refills | Status: AC

## 2022-11-29 NOTE — Telephone Encounter (Signed)
Patient developed a yeast infection after antibiotics. Sent diflucan to pharmacy.

## 2023-01-24 ENCOUNTER — Telehealth: Payer: 59 | Admitting: Family Medicine

## 2023-01-24 DIAGNOSIS — K0889 Other specified disorders of teeth and supporting structures: Secondary | ICD-10-CM | POA: Diagnosis not present

## 2023-01-24 DIAGNOSIS — K047 Periapical abscess without sinus: Secondary | ICD-10-CM | POA: Diagnosis not present

## 2023-01-24 MED ORDER — IBUPROFEN 600 MG PO TABS: 600.0000 mg | ORAL_TABLET | Freq: Three times a day (TID) | ORAL | 0 refills | Status: AC | PRN

## 2023-01-24 MED ORDER — PENICILLIN V POTASSIUM 500 MG PO TABS: 500.0000 mg | ORAL_TABLET | Freq: Three times a day (TID) | ORAL | 0 refills | Status: AC

## 2023-01-24 NOTE — Progress Notes (Signed)

## 2023-02-16 ENCOUNTER — Ambulatory Visit
Admission: EM | Admit: 2023-02-16 | Discharge: 2023-02-16 | Disposition: A | Discharge status: Home / Self Care | Payer: 59 | Attending: Emergency Medicine | Admitting: Emergency Medicine

## 2023-02-16 ENCOUNTER — Encounter: Payer: Self-pay | Admitting: Emergency Medicine

## 2023-02-16 DIAGNOSIS — M545 Low back pain, unspecified: Secondary | ICD-10-CM | POA: Diagnosis not present

## 2023-02-16 MED ORDER — CYCLOBENZAPRINE HCL 10 MG PO TABS: 10.0000 mg | ORAL_TABLET | Freq: Two times a day (BID) | ORAL | 0 refills | Status: DC | PRN

## 2023-02-16 MED ORDER — DEXAMETHASONE SODIUM PHOSPHATE 10 MG/ML IJ SOLN
10.0000 mg | Freq: Once | INTRAMUSCULAR | Status: AC
  Administered 2023-02-16: 10 mg via INTRAMUSCULAR

## 2023-02-16 MED ORDER — PREDNISONE 20 MG PO TABS: 40.0000 mg | ORAL_TABLET | Freq: Every day | ORAL | 0 refills | Status: DC

## 2023-02-16 MED ORDER — TRAMADOL HCL 50 MG PO TABS: 50.0000 mg | ORAL_TABLET | Freq: Four times a day (QID) | ORAL | 0 refills | Status: DC | PRN

## 2023-02-16 NOTE — ED Triage Notes (Signed)
Patient c/o lower back pain that started 2 days.  Patient states that she does have a history of herniated disc in her lower back.  Patient will need a work note.

## 2023-02-16 NOTE — Discharge Instructions (Addendum)
Today you are being treated for your low back pain which is most likely related to herniated disc  You have been given an injection of a steroid today in the office to help reduce inflammation which ideally will help reduce pain ideally will start to see some relief in 30 minutes to an hour  Starting tomorrow take prednisone every morning with food for 5 days to continue the above process, may take Tylenol in addition to this  You may use muscle relaxant twice daily, be mindful this may make you sleepy  For severe pain you may use tramadol every 6 hours, be mindful of this will make you feel   You may use heating pad in 15 minute intervals as needed for additional comfort, you may find comfort in using ice in 10-15 minutes over affected area  Begin stretching affected area daily for 10 minutes as tolerated to further loosen muscles   When sitting and lying down place pillow underneath and between knees for support  Practice good posture: head back, shoulders back, chest forward, pelvis back and weight distributed evenly on both legs  If pain persist after recommended treatment or reoccurs if may be beneficial to follow up with orthopedic specialist for evaluation, this doctor specializes in the bones and can manage your symptoms long-term with options such as but not limited to imaging, medications or physical therapy

## 2023-02-16 NOTE — ED Provider Notes (Signed)
[facility]    CSN: [id number] Arrival date & time: 02/16/23  1131      History   Chief Complaint Chief Complaint  Patient presents with   Back Pain    HPI Alexis Berg is a 32 y.o. female.   Patient presents for evaluation of bilateral lower back pain present for 2 days.  Back pain is constant, radiates to the top of the buttocks, described as soreness and discomfort, rated a 10 out of 10.  Pain is exacerbated when changing positions.  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  History of a herniated disc.  Has attempted use of ibuprofen which has been ineffective.  Denies numbness, tingling, urinary or bowel incontinence.      Past Medical History:  Diagnosis Date   Anxiety    GERD (gastroesophageal reflux disease)    Heartburn    Morbid obesity (HCC)    MRSA (methicillin resistant staph aureus) culture positive    2013/2014-on right leg/ thigh   Pruritus     Patient Active Problem List   Diagnosis Date Noted   Generalized anxiety disorder 12/28/2021   Migraines 09/30/2020   Anxiety 11/09/2018   Heartburn 11/09/2018   Morbid obesity (HCC)    MRSA (methicillin resistant staph aureus) culture positive     Past Surgical History:  Procedure Laterality Date   DILATION AND CURETTAGE OF UTERUS     DILATION AND CURETTAGE OF UTERUS N/A 12/10/2018   Procedure: DILATATION AND CURETTAGE SUCTION;  Surgeon: Vena Austria, MD;  Location: [facility];  Service: Gynecology;  Laterality: N/A;   INCISION AND DRAINAGE      OB History     Gravida  5   Para  3   Term  3   Preterm      AB  1   Living  3      SAB  0   IAB  1   Ectopic      Multiple      Live Births  3            Home Medications    Prior to Admission medications   Medication Sig Start Date End Date Taking? Authorizing Provider  bacitracin ointment Apply with Q-tip in each nostril daily x  1 month 11/20/22   Rodriguez-Southworth, Nettie Elm, PA-C  sulfamethoxazole-trimethoprim (BACTRIM DS) 800-160 MG tablet 2 bid x 7 days 11/20/22   Rodriguez-Southworth, Nettie Elm, PA-C  terbinafine (LAMISIL) 250 MG tablet Take 1 tablet (250 mg total) by mouth daily. 08/14/22   Margaretann Loveless, PA-C  estrogens, conjugated, (PREMARIN) 1.25 MG tablet Take 1 tablet (1.25 mg total) by mouth daily for 14 days. 03/28/19 07/15/19  Vena Austria, MD  medroxyPROGESTERone (PROVERA) 10 MG tablet Take 1 tablet (10 mg total) by mouth daily for 10 days. Start after first 14 days of premarin 03/28/19 07/15/19  Vena Austria, MD    Family History Family History  Problem Relation Age of Onset   Breast cancer Mother 27   Diabetes Mother    Thyroid disease Mother    Hypertension Mother    Heart attack Father 28   Parkinson's disease Father    Hypertension Father    Sickle cell anemia Nephew        brother's son    Social History Social History   Tobacco Use   Smoking status: Former    Current packs/day: 0.00    Average  packs/day: 0.3 packs/day for 5.0 years (1.3 ttl pk-yrs)    Types: Cigarettes    Start date: 08/14/2011    Quit date: 08/13/2016    Years since quitting: 6.5   Smokeless tobacco: Never  Vaping Use   Vaping status: Never Used  Substance Use Topics   Alcohol use: Not Currently    Alcohol/week: 1.0 standard drink of alcohol    Types: 1 Standard drinks or equivalent per week    Comment: last use 08/2021 "not often"   Drug use: No     Allergies   Patient has no known allergies.   Review of Systems Review of Systems  Musculoskeletal:  Positive for back pain.     Physical Exam Triage Vital Signs ED Triage Vitals  Encounter Vitals Group     BP 02/16/23 1146 108/60     Systolic BP Percentile --      Diastolic BP Percentile --      Pulse Rate 02/16/23 1146 (!) 108     Resp 02/16/23 1146 15     Temp 02/16/23 1146 98.8 F (37.1 C)     Temp Source 02/16/23 1146 Oral     SpO2  02/16/23 1146 96 %     Weight 02/16/23 1144 214 lb 15.2 oz (97.5 kg)     Height 02/16/23 1144 5\' 2"  (1.575 m)     Head Circumference --      Peak Flow --      Pain Score 02/16/23 1144 10     Pain Loc --      Pain Education --      Exclude from Growth Chart --    No data found.  Updated Vital Signs BP 108/60 (BP Location: Left Arm)   Pulse (!) 108   Temp 98.8 F (37.1 C) (Oral)   Resp 15   Ht 5\' 2"  (1.575 m)   Wt 214 lb 15.2 oz (97.5 kg)   LMP 01/30/2023 (Approximate)   SpO2 96%   BMI 39.31 kg/m   Visual Acuity Right Eye Distance:   Left Eye Distance:   Bilateral Distance:    Right Eye Near:   Left Eye Near:    Bilateral Near:     Physical Exam Constitutional:      Appearance: Normal appearance.  Eyes:     Extraocular Movements: Extraocular movements intact.  Pulmonary:     Effort: Pulmonary effort is normal.  Musculoskeletal:     Comments: Tenderness is generalized to the lumbar region, limitations given to range of motion due to pain elicited, unable to assess straight leg test due to pain elicited, difficulty standing erect due to pain elicited  Neurological:     Mental Status: She is alert and oriented to person, place, and time. Mental status is at baseline.      UC Treatments / Results  Labs (all labs ordered are listed, but only abnormal results are displayed) Labs Reviewed - No data to display  EKG   Radiology No results found.  Procedures Procedures (including critical care time)  Medications Ordered in UC Medications - No data to display  Initial Impression / Assessment and Plan / UC Course  I have reviewed the triage vital signs and the nursing notes.  Pertinent labs & imaging results that were available during my care of the patient were reviewed by me and considered in my medical decision making (see chart for details).  Lumbar back pain  Etiology most likely muscular, imaging deferred at this time, patient  very tearful throughout  entirety of exam due to pain, comfort given, Decadron injection given prior to discharge, prescribed prednisone, Flexeril and tramadol, PDMP reviewed, low risk, recommend ice heat massage stretching with activity as tolerated, walking referral given to orthopedics as she most likely will need follow-up if pain persist, work note given Final Clinical Impressions(s) / UC Diagnoses   Final diagnoses:  None   Discharge Instructions   None    ED Prescriptions   None    PDMP not reviewed this encounter.   Valinda Hoar, NP 02/16/23 1224

## 2023-02-28 IMAGING — CT CT HEAD W/O CM
3 series · 16 of 47 positions shown, 19 images · non-contrast
Comparison: None.

CLINICAL DATA: Fall with trauma to the head and face.  Headache.

EXAM:
CT HEAD WITHOUT CONTRAST
TECHNIQUE: Contiguous axial images were obtained from the base of the skull
through the vertex without intravenous contrast.

[Series 2: head wo · axial · 0.42mm/px · z∈[+1441,+1571]mm · 10 of 32 slices shown, 13 images]
[im 3/32  brain]
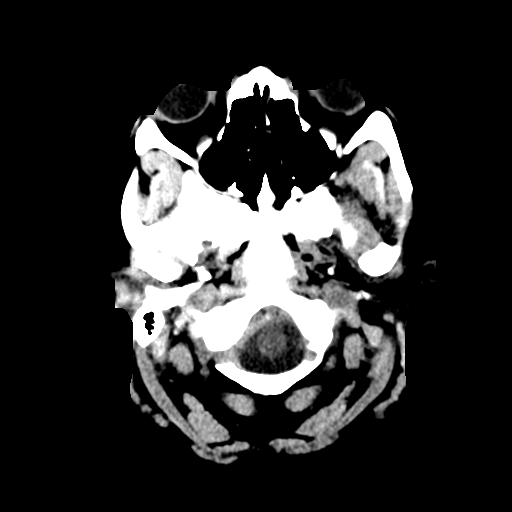
[im 3/32  bone]
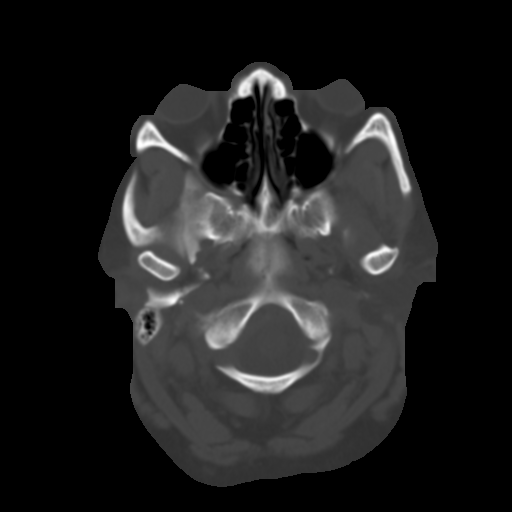
[im 6/32  brain]
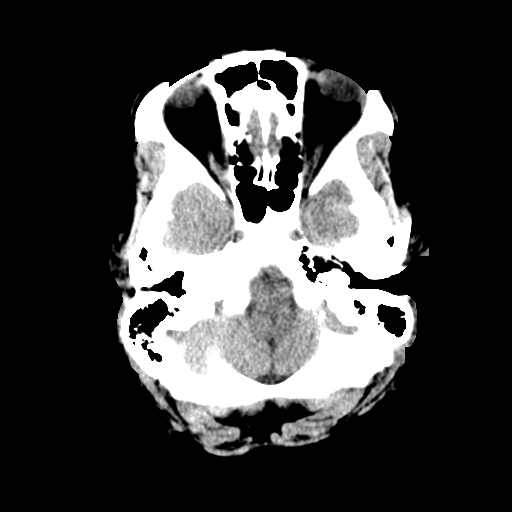
[im 9/32  brain]
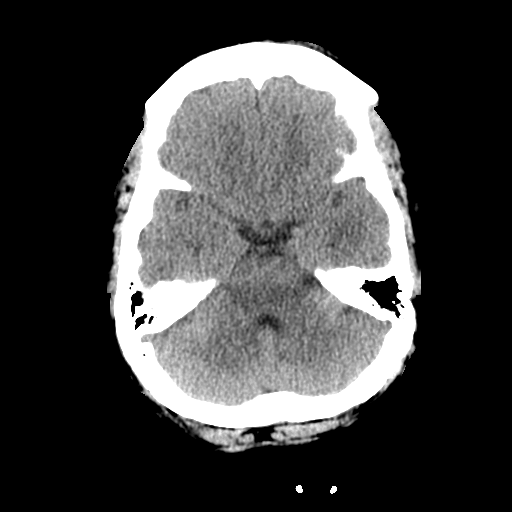
[im 11/32  brain]
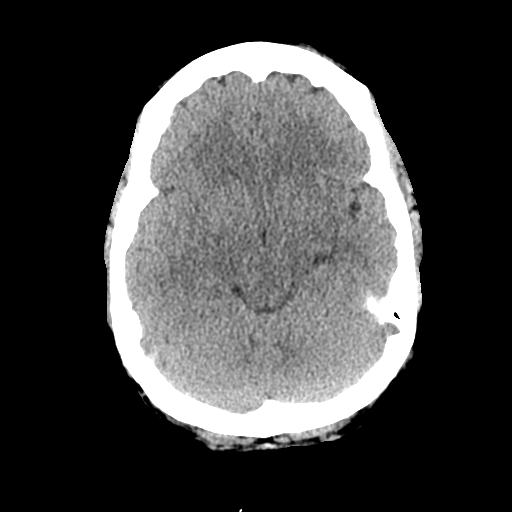
[im 14/32  brain]
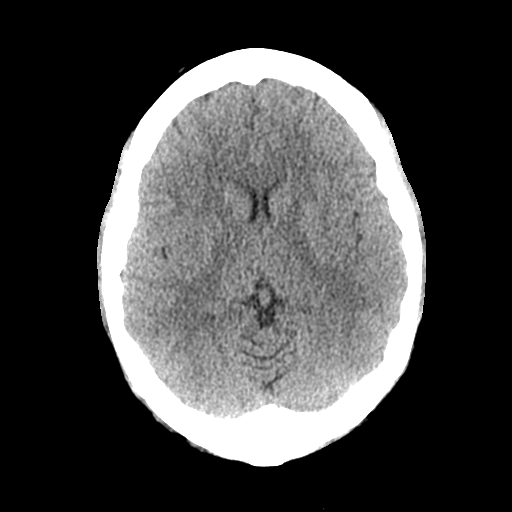
[im 14/32  bone]
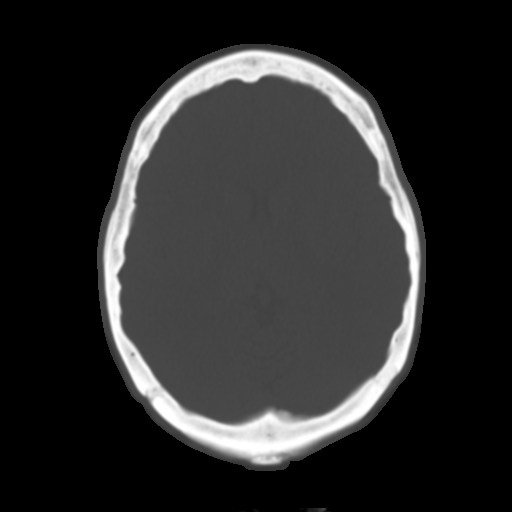
[im 18/32  brain]
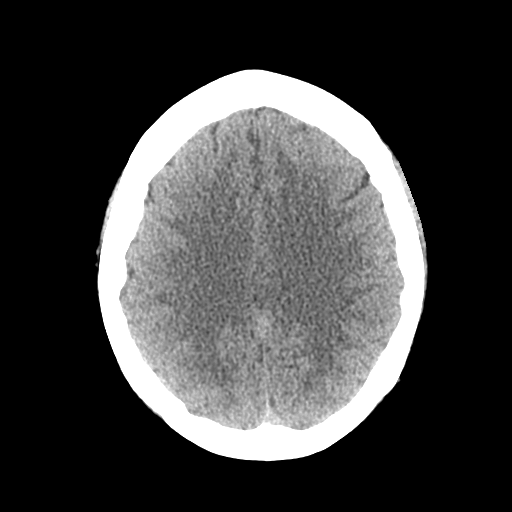
[im 21/32  brain]
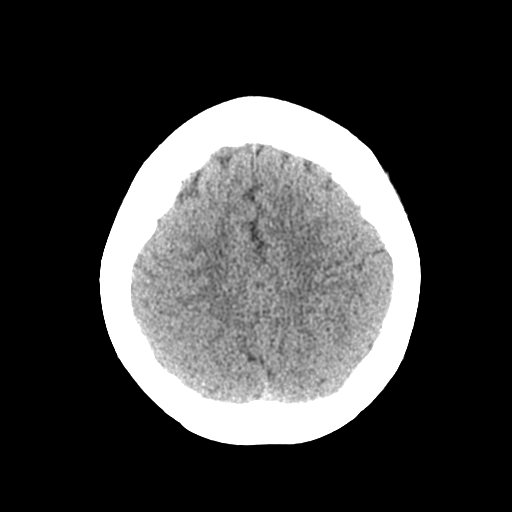
[im 24/32  brain]
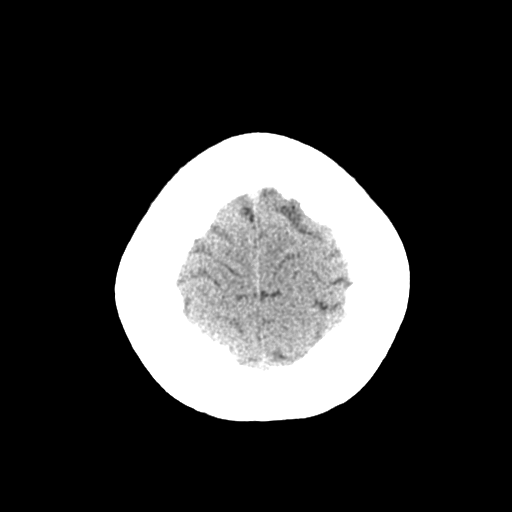
[im 26/32  brain]
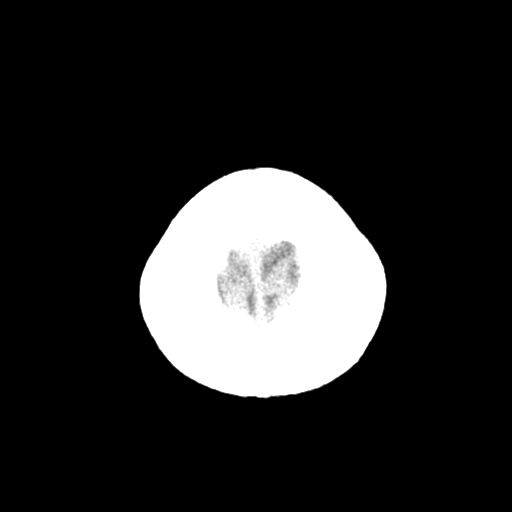
[im 26/32  bone]
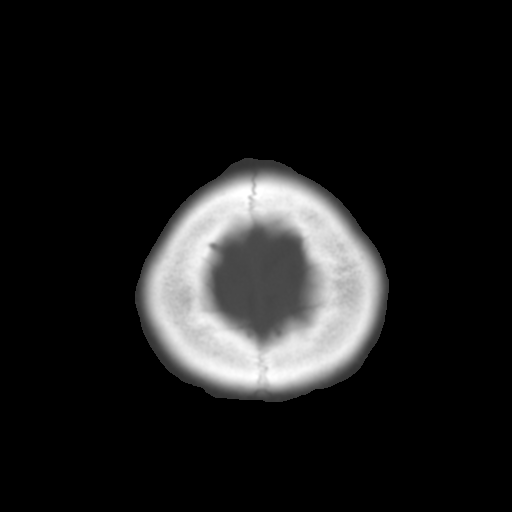
[im 29/32  brain]
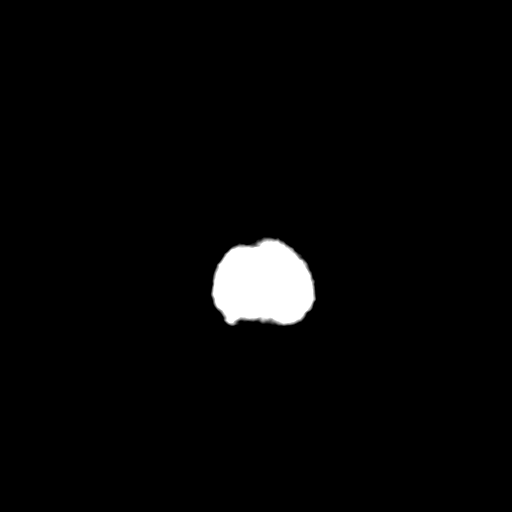

[Series 4: coronal soft tissue · coronal · 0.33mm/px · 3 of 72 slices shown]
[im 24/72  brain]
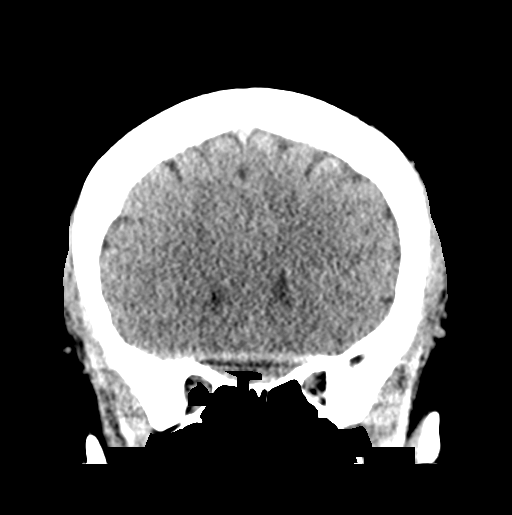
[im 32/72  brain]
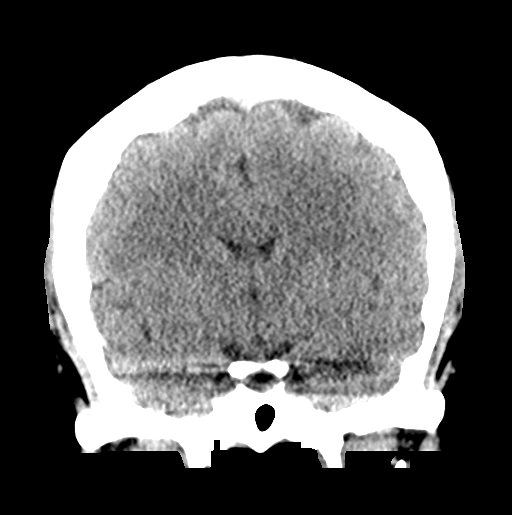
[im 40/72  brain]
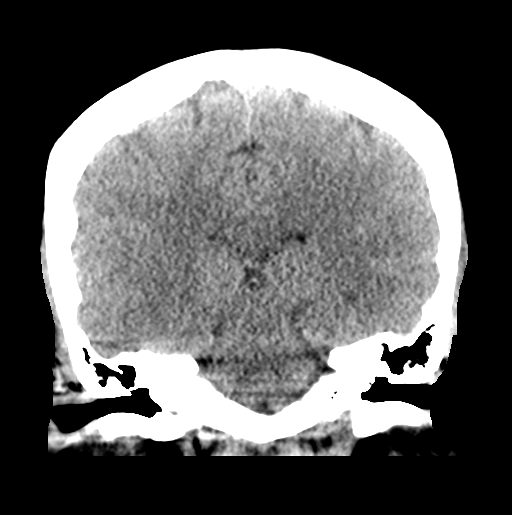

[Series 5: sagittal soft tissue · sagittal · 0.33mm/px · 3 of 56 slices shown]
[im 19/56  brain]
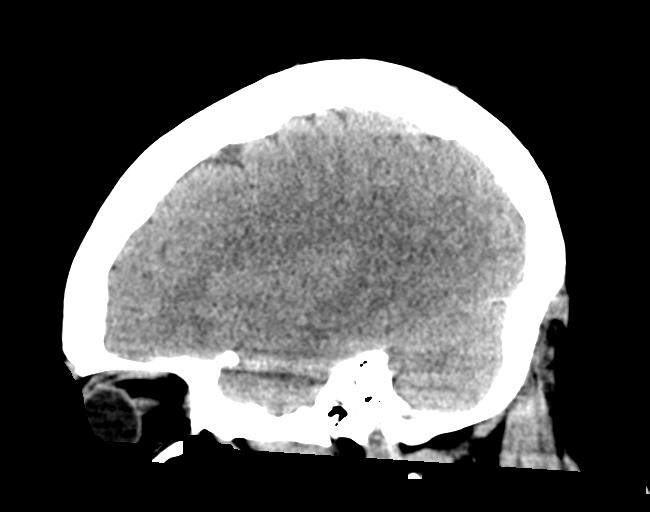
[im 28/56  brain]
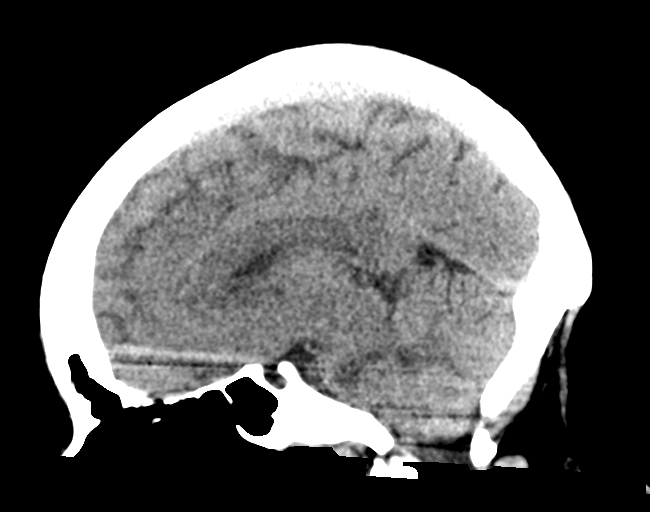
[im 37/56  brain]
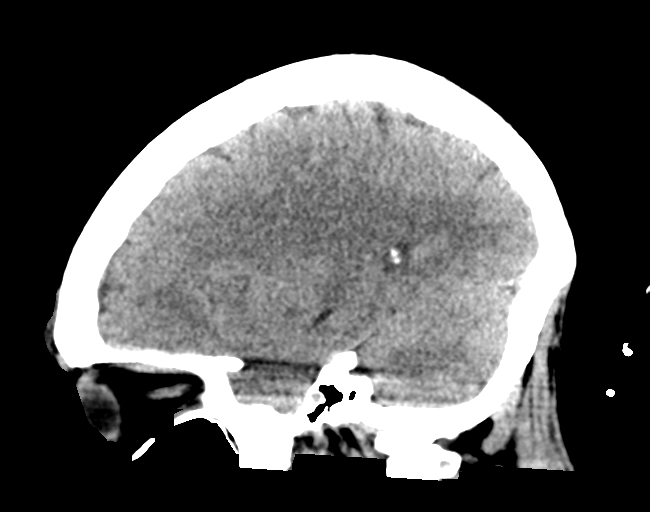

[16 of 47 positions shown; findings below may reference images not displayed]

FINDINGS: Brain: The brain shows a normal appearance without evidence of
malformation, atrophy, old or acute small or large vessel
infarction, mass lesion, hemorrhage, hydrocephalus or extra-axial
collection.

Vascular: No hyperdense vessel. No evidence of atherosclerotic
calcification.

Skull: Normal.  No traumatic finding.  No focal bone lesion.

Sinuses/Orbits: Sinuses are clear. Orbits appear normal. Mastoids
are clear.

Other: Mild left forehead soft tissue swelling.
IMPRESSION: Normal appearance of the brain. No intracranial injury. No skull
fracture. Mild left forehead soft tissue swelling.

## 2023-03-08 ENCOUNTER — Telehealth: Payer: 59 | Admitting: Physician Assistant

## 2023-03-08 DIAGNOSIS — B3731 Acute candidiasis of vulva and vagina: Secondary | ICD-10-CM

## 2023-03-08 MED ORDER — FLUCONAZOLE 150 MG PO TABS: 150.0000 mg | ORAL_TABLET | Freq: Every day | ORAL | 0 refills | Status: DC

## 2023-03-08 NOTE — Progress Notes (Signed)
I have spent 5 minutes in review of e-visit questionnaire, review and updating patient chart, medical decision making and response to patient.   William Cody Martin, PA-C    

## 2023-03-08 NOTE — Progress Notes (Signed)

## 2023-03-21 ENCOUNTER — Telehealth: Payer: Self-pay

## 2023-03-21 ENCOUNTER — Ambulatory Visit
Admission: EM | Admit: 2023-03-21 | Discharge: 2023-03-21 | Disposition: A | Discharge status: Home / Self Care | Payer: 59 | Attending: Nurse Practitioner | Admitting: Nurse Practitioner

## 2023-03-21 ENCOUNTER — Other Ambulatory Visit: Payer: Self-pay

## 2023-03-21 DIAGNOSIS — R21 Rash and other nonspecific skin eruption: Secondary | ICD-10-CM | POA: Diagnosis not present

## 2023-03-21 DIAGNOSIS — L709 Acne, unspecified: Secondary | ICD-10-CM

## 2023-03-21 MED ORDER — PREDNISONE 20 MG PO TABS: 40.0000 mg | ORAL_TABLET | Freq: Every day | ORAL | 0 refills | Status: AC

## 2023-03-21 MED ORDER — FLUCONAZOLE 150 MG PO TABS: 150.0000 mg | ORAL_TABLET | Freq: Once | ORAL | 0 refills | Status: AC

## 2023-03-21 MED ORDER — ADAPALENE-BENZOYL PER-CLINDAMY 0.1-2.5-1 % EX GEL: 1.0000 | Freq: Every day | CUTANEOUS | 0 refills | Status: DC

## 2023-03-21 MED ORDER — DOXYCYCLINE HYCLATE 100 MG PO TABS: 100.0000 mg | ORAL_TABLET | Freq: Two times a day (BID) | ORAL | 0 refills | Status: AC

## 2023-03-21 NOTE — Discharge Instructions (Signed)
Take medication as prescribed. Continue to discontinue use of niacinamide. Make sure you are drinking at least 8-10 8 ounce glasses of water daily. Recommend adding a barrier in your mask at work to protect the skin and further breakouts. Make sure you are eating a healthy diet. Recommend using tumeric soap or other nonharsh agents that may irritate the skin. If symptoms do not improve with this treatment, I would like for you to follow-up with your primary care physician to discuss referral to dermatology. Follow-up as needed.

## 2023-03-21 NOTE — ED Triage Notes (Signed)
Pt c/o allergic reaction notcied it started x 3 days ago, pt states she shaved herface, she used a new pore cleanser used it and the next day she noticed white heads and sores on her face.

## 2023-03-21 NOTE — ED Provider Notes (Signed)
[facility]    CSN: [id number] Arrival date & time: 03/21/23  1512      History   Chief Complaint No chief complaint on file.   HPI Alexis Berg is a 32 y.o. female.   The history is provided by the patient.   The patient presents for complaints of an allergic reaction to her face after she began using niacinamide for cleanser.  Patient states that she "shaved her face and then applied the cleanser.  She states the next day, she had an increased amount of whiteheads and sores on her face.  Patient reports that she does have an underlying history of acne.  She states that her face feels "tight, itchy, and burning.  She denies fever, chills, chest pain, abdominal pain, nausea, vomiting, or diarrhea.  She states last evening, she cleaned her face with water only.  She states she stopped using the cleansing agent 2 days ago.  Patient also reports that she wears a mask daily, and mask is the same each day.  Past Medical History:  Diagnosis Date   Anxiety    GERD (gastroesophageal reflux disease)    Heartburn    Morbid obesity (HCC)    MRSA (methicillin resistant staph aureus) culture positive    2013/2014-on right leg/ thigh   Pruritus     Patient Active Problem List   Diagnosis Date Noted   Generalized anxiety disorder 12/28/2021   Migraines 09/30/2020   Anxiety 11/09/2018   Heartburn 11/09/2018   Morbid obesity (HCC)    MRSA (methicillin resistant staph aureus) culture positive     Past Surgical History:  Procedure Laterality Date   DILATION AND CURETTAGE OF UTERUS     DILATION AND CURETTAGE OF UTERUS N/A 12/10/2018   Procedure: DILATATION AND CURETTAGE SUCTION;  Surgeon: Vena Austria, MD;  Location: [facility];  Service: Gynecology;  Laterality: N/A;   INCISION AND DRAINAGE      OB History     Gravida  5   Para  3   Term  3   Preterm      AB  1   Living  3      SAB  0   IAB  1   Ectopic      Multiple      Live Births  3             Home Medications    Prior to Admission medications   Medication Sig Start Date End Date Taking? Authorizing Provider  Adapalene-Benzoyl Per-Clindamy 0.1-2.5-1 % GEL Apply 1 Application topically at bedtime. 03/21/23  Yes Trek Kimball-Warren, Sadie Haber, NP  doxycycline (VIBRA-TABS) 100 MG tablet Take 1 tablet (100 mg total) by mouth 2 (two) times daily for 7 days. 03/21/23 03/28/23 Yes Chrystal Zeimet-Warren, Sadie Haber, NP  predniSONE (DELTASONE) 20 MG tablet Take 2 tablets (40 mg total) by mouth daily with breakfast for 5 days. 03/21/23 03/26/23 Yes Toi Stelly-Warren, Sadie Haber, NP  bacitracin ointment Apply with Q-tip in each nostril daily x 1 month 11/20/22   Rodriguez-Southworth, Nettie Elm, PA-C  cyclobenzaprine (FLEXERIL) 10 MG tablet Take 1 tablet (10 mg total) by mouth 2 (two) times daily as needed for muscle spasms. 02/16/23   White, Elita Boone, NP  fluconazole (DIFLUCAN) 150 MG tablet Take 1 tablet (150 mg total) by mouth daily. 03/08/23   Waldon Merl, PA-C  sulfamethoxazole-trimethoprim (BACTRIM DS) 800-160 MG tablet 2 bid x 7 days 11/20/22   Rodriguez-Southworth, Nettie Elm, PA-C  traMADol (ULTRAM) 50 MG tablet Take  1 tablet (50 mg total) by mouth every 6 (six) hours as needed. 02/16/23   Valinda Hoar, NP  estrogens, conjugated, (PREMARIN) 1.25 MG tablet Take 1 tablet (1.25 mg total) by mouth daily for 14 days. 03/28/19 07/15/19  Vena Austria, MD  medroxyPROGESTERone (PROVERA) 10 MG tablet Take 1 tablet (10 mg total) by mouth daily for 10 days. Start after first 14 days of premarin 03/28/19 07/15/19  Vena Austria, MD    Family History Family History  Problem Relation Age of Onset   Breast cancer Mother 59   Diabetes Mother    Thyroid disease Mother    Hypertension Mother    Heart attack Father 40   Parkinson's disease Father    Hypertension Father    Sickle cell anemia Nephew        brother's son    Social History Social History   Tobacco Use   Smoking status: Former    Current  packs/day: 0.00    Average packs/day: 0.3 packs/day for 5.0 years (1.3 ttl pk-yrs)    Types: Cigarettes    Start date: 08/14/2011    Quit date: 08/13/2016    Years since quitting: 6.6   Smokeless tobacco: Never  Vaping Use   Vaping status: Never Used  Substance Use Topics   Alcohol use: Not Currently    Alcohol/week: 1.0 standard drink of alcohol    Types: 1 Standard drinks or equivalent per week    Comment: last use 08/2021 "not often"   Drug use: No     Allergies   Patient has no known allergies.   Review of Systems Review of Systems Per HPI  Physical Exam Triage Vital Signs ED Triage Vitals  Encounter Vitals Group     BP 03/21/23 1639 127/81     Systolic BP Percentile --      Diastolic BP Percentile --      Pulse Rate 03/21/23 1639 94     Resp 03/21/23 1639 16     Temp 03/21/23 1639 99.9 F (37.7 C)     Temp Source 03/21/23 1639 Oral     SpO2 03/21/23 1639 98 %     Weight --      Height --      Head Circumference --      Peak Flow --      Pain Score 03/21/23 1641 8     Pain Loc --      Pain Education --      Exclude from Growth Chart --    No data found.  Updated Vital Signs BP 127/81 (BP Location: Right Arm)   Pulse 94   Temp 99.9 F (37.7 C) (Oral)   Resp 16   LMP 02/18/2023 (Exact Date)   SpO2 98%   Visual Acuity Right Eye Distance:   Left Eye Distance:   Bilateral Distance:    Right Eye Near:   Left Eye Near:    Bilateral Near:     Physical Exam Vitals and nursing note reviewed.  Constitutional:      General: She is not in acute distress.    Appearance: Normal appearance.  HENT:     Head: Normocephalic.  Eyes:     Extraocular Movements: Extraocular movements intact.     Pupils: Pupils are equal, round, and reactive to light.  Pulmonary:     Effort: Pulmonary effort is normal.  Musculoskeletal:     Cervical back: Normal range of motion.  Skin:    General: Skin is  warm and dry.     Findings: Acne present.     Comments: Close  comedones noted to the bilateral face.  Papules noted bilaterally.  Erythema noted on the underlying skin.  Neurological:     General: No focal deficit present.     Mental Status: She is alert and oriented to person, place, and time.  Psychiatric:        Mood and Affect: Mood normal.        Behavior: Behavior normal.      UC Treatments / Results  Labs (all labs ordered are listed, but only abnormal results are displayed) Labs Reviewed - No data to display  EKG   Radiology No results found.  Procedures Procedures (including critical care time)  Medications Ordered in UC Medications - No data to display  Initial Impression / Assessment and Plan / UC Course  I have reviewed the triage vital signs and the nursing notes.  Pertinent labs & imaging results that were available during my care of the patient were reviewed by me and considered in my medical decision making (see chart for details).  The patient is well-appearing, she is in no acute distress, vital signs are stable.  Suspect an allergic reaction to the new cleansing agent along with underlying acne condition.  Will treat with doxycycline 100 mg twice daily for the next 7 days for underlying acne and skin infection, prednisone 40 mg for the next 5 days for localized reaction, and adapalene-benzyl peroxide-clindamycin 0.1-2 0.5-1% gel for acne to use at bedtime.  Supportive care recommendations were provided and discussed with the patient to include drinking plenty of water daily, keeping the face well moisturized, and avoiding picking or disrupting the papules that are present.  Suggested the use of CeraVe or tumeric soap to help with inflammation of the skin.  Patient is in agreement with this plan of care and verbalizes understanding.  All questions were answered.  Patient stable for discharge.  Final Clinical Impressions(s) / UC Diagnoses   Final diagnoses:  Rash and nonspecific skin eruption  Acne, unspecified acne  type     Discharge Instructions      Take medication as prescribed. Continue to discontinue use of niacinamide. Make sure you are drinking at least 8-10 8 ounce glasses of water daily. Recommend adding a barrier in your mask at work to protect the skin and further breakouts. Make sure you are eating a healthy diet. Recommend using tumeric soap or other nonharsh agents that may irritate the skin. If symptoms do not improve with this treatment, I would like for you to follow-up with your primary care physician to discuss referral to dermatology. Follow-up as needed.     ED Prescriptions     Medication Sig Dispense Auth. Provider   doxycycline (VIBRA-TABS) 100 MG tablet Take 1 tablet (100 mg total) by mouth 2 (two) times daily for 7 days. 14 tablet Derelle Cockrell-Warren, Sadie Haber, NP   predniSONE (DELTASONE) 20 MG tablet Take 2 tablets (40 mg total) by mouth daily with breakfast for 5 days. 10 tablet Demoni Parmar-Warren, Sadie Haber, NP   Adapalene-Benzoyl Per-Clindamy 0.1-2.5-1 % GEL Apply 1 Application topically at bedtime. 60 g Janat Tabbert-Warren, Sadie Haber, NP      PDMP not reviewed this encounter.   Abran Cantor, NP 03/21/23 1725

## 2023-03-21 NOTE — Telephone Encounter (Signed)
Pharmacy called stating that they do not have the Adapalene-Benzoyl peroxide -Clindamycin Gel ointment. They do not carry Adapalene at all. Provider has okayed pharmacy to fill benzoyl-Clindamycin gel,, pharmacy verbalized understanding.

## 2023-07-06 ENCOUNTER — Telehealth: Payer: 59 | Admitting: Family Medicine

## 2023-07-06 DIAGNOSIS — B3731 Acute candidiasis of vulva and vagina: Secondary | ICD-10-CM

## 2023-07-06 MED ORDER — FLUCONAZOLE 150 MG PO TABS: 150.0000 mg | ORAL_TABLET | Freq: Every day | ORAL | 0 refills | Status: DC

## 2023-07-06 NOTE — Progress Notes (Signed)

## 2023-07-12 ENCOUNTER — Telehealth: Payer: 59 | Admitting: Family Medicine

## 2023-07-12 DIAGNOSIS — L709 Acne, unspecified: Secondary | ICD-10-CM

## 2023-07-13 MED ORDER — MINOCYCLINE HCL 50 MG PO TABS: 50.0000 mg | ORAL_TABLET | Freq: Two times a day (BID) | ORAL | 0 refills | Status: AC

## 2023-07-13 NOTE — Progress Notes (Signed)
 E-Visit for Acne   We are sorry that you are experiencing this issue.  Here is how we plan to help!  Based on what you shared with me it looks like you have cystic acne.  Acne is a disorder of the hair follicles and oil glands (sebaceous glands). The sebaceous glands secrete oils to keep the skin moist.  When the glands get clogged, it can lead to pimples or cysts.  These cysts may become infected and leave scars. Acne is very common and normally occurs at puberty.  Acne is also inherited.  Your personal care plan consists of the following recommendations:  I recommend that you use a daily cleanser  You may try a topical exfoliator and salicylic acid scrub.  These scrubs have coarse particles that clear your pores but may also irritate your skin.    I have also prescribed one of the following additional therapies:  Minocycline an oral antibiotic 50 mg twice a day  If excessive dryness or peeling occurs, reduce dose frequency or concentration of the topical scrubs.  If excessive stinging or burning occurs, remove the topical gel with mild soap and water and resume at a lower dose the next day.  Remember oral antibiotics and topical acne treatments may increase your sensitivity to the sun!  HOME CARE: Do not squeeze pimples because that can often lead to infections, worse acne, and scars. Use a moisturizer that contains retinoid or fruit acids that may inhibit the development of new acne lesions. Although there is not a clear link that foods can cause acne, doctors do believe that too many sweets predispose you to skin problems.  GET HELP RIGHT AWAY IF: If your acne gets worse or is not better within 10 days. If you become depressed. If you become pregnant, discontinue medications and call your OB/GYN.  MAKE SURE YOU: Understand these instructions. Will watch your condition. Will get help right away if you are not doing well or get worse.  Thank you for choosing an e-visit.  Your  e-visit answers were reviewed by a board certified advanced clinical practitioner to complete your personal care plan. Depending upon the condition, your plan could have included both over the counter or prescription medications.  Please review your pharmacy choice. Make sure the pharmacy is open so you can pick up prescription now. If there is a problem, you may contact your provider through [employer] and have the prescription routed to another pharmacy.  Your safety is important to us . If you have drug allergies check your prescription carefully.   For the next 24 hours you can use MyChart to ask questions about today's visit, request a non-urgent call back, or ask for a work or school excuse. You will get an email in the next two days asking about your experience. I hope that your e-visit has been valuable and will speed your recovery.    have provided 5 minutes of non face to face time during this encounter for chart review and documentation.

## 2023-07-31 ENCOUNTER — Other Ambulatory Visit (HOSPITAL_COMMUNITY): Payer: Self-pay | Admitting: Nurse Practitioner

## 2023-07-31 DIAGNOSIS — N63 Unspecified lump in unspecified breast: Secondary | ICD-10-CM

## 2023-08-01 ENCOUNTER — Other Ambulatory Visit (HOSPITAL_COMMUNITY): Payer: Self-pay | Admitting: Nurse Practitioner

## 2023-08-01 DIAGNOSIS — N63 Unspecified lump in unspecified breast: Secondary | ICD-10-CM

## 2023-08-07 ENCOUNTER — Other Ambulatory Visit (HOSPITAL_COMMUNITY): Payer: Self-pay | Admitting: Nurse Practitioner

## 2023-08-07 ENCOUNTER — Ambulatory Visit
Admission: RE | Admit: 2023-08-07 | Discharge: 2023-08-07 | Disposition: A | Discharge status: Home / Self Care | Payer: BC Managed Care – PPO | Source: Ambulatory Visit | Attending: Nurse Practitioner

## 2023-08-07 ENCOUNTER — Ambulatory Visit
Admission: RE | Admit: 2023-08-07 | Discharge: 2023-08-07 | Disposition: A | Discharge status: Home / Self Care | Payer: 59 | Source: Ambulatory Visit | Attending: Nurse Practitioner | Admitting: Nurse Practitioner

## 2023-08-07 DIAGNOSIS — N63 Unspecified lump in unspecified breast: Secondary | ICD-10-CM

## 2023-08-07 DIAGNOSIS — N6001 Solitary cyst of right breast: Secondary | ICD-10-CM

## 2023-08-13 ENCOUNTER — Ambulatory Visit
Admission: RE | Admit: 2023-08-13 | Discharge: 2023-08-13 | Disposition: A | Discharge status: Home / Self Care | Payer: BC Managed Care – PPO | Source: Ambulatory Visit | Attending: Nurse Practitioner | Admitting: Nurse Practitioner

## 2023-08-13 DIAGNOSIS — N6001 Solitary cyst of right breast: Secondary | ICD-10-CM

## 2023-09-25 ENCOUNTER — Encounter (HOSPITAL_COMMUNITY): Payer: 59

## 2023-09-25 ENCOUNTER — Other Ambulatory Visit (HOSPITAL_COMMUNITY): Payer: 59

## 2023-09-25 ENCOUNTER — Encounter (HOSPITAL_COMMUNITY): Payer: Self-pay

## 2023-12-29 ENCOUNTER — Telehealth: Admitting: Nurse Practitioner

## 2023-12-29 DIAGNOSIS — B3731 Acute candidiasis of vulva and vagina: Secondary | ICD-10-CM

## 2023-12-29 MED ORDER — FLUCONAZOLE 150 MG PO TABS: 150.0000 mg | ORAL_TABLET | Freq: Every day | ORAL | 0 refills | Status: DC

## 2023-12-29 NOTE — Progress Notes (Signed)
 I have spent 5 minutes in review of e-visit questionnaire, review and updating patient chart, medical decision making and response to patient.   Claiborne Rigg, NP

## 2023-12-29 NOTE — Progress Notes (Signed)

## 2024-01-16 ENCOUNTER — Telehealth: Admitting: Physician Assistant

## 2024-01-16 DIAGNOSIS — B9689 Other specified bacterial agents as the cause of diseases classified elsewhere: Secondary | ICD-10-CM

## 2024-01-16 DIAGNOSIS — N760 Acute vaginitis: Secondary | ICD-10-CM

## 2024-01-16 MED ORDER — METRONIDAZOLE 500 MG PO TABS: 500.0000 mg | ORAL_TABLET | Freq: Two times a day (BID) | ORAL | 0 refills | Status: AC

## 2024-01-16 NOTE — Progress Notes (Signed)

## 2024-01-21 ENCOUNTER — Other Ambulatory Visit: Payer: Self-pay | Admitting: Medical Genetics

## 2024-02-27 ENCOUNTER — Emergency Department (HOSPITAL_COMMUNITY)
Admission: EM | Admit: 2024-02-27 | Discharge: 2024-02-27 | Disposition: A | Discharge status: Home / Self Care | Attending: Emergency Medicine | Admitting: Emergency Medicine

## 2024-02-27 ENCOUNTER — Other Ambulatory Visit: Payer: Self-pay

## 2024-02-27 ENCOUNTER — Emergency Department (HOSPITAL_COMMUNITY)

## 2024-02-27 ENCOUNTER — Encounter (HOSPITAL_COMMUNITY): Payer: Self-pay

## 2024-02-27 DIAGNOSIS — Y92410 Unspecified street and highway as the place of occurrence of the external cause: Secondary | ICD-10-CM | POA: Insufficient documentation

## 2024-02-27 DIAGNOSIS — R519 Headache, unspecified: Secondary | ICD-10-CM | POA: Diagnosis not present

## 2024-02-27 DIAGNOSIS — S20219A Contusion of unspecified front wall of thorax, initial encounter: Secondary | ICD-10-CM | POA: Diagnosis not present

## 2024-02-27 DIAGNOSIS — R079 Chest pain, unspecified: Secondary | ICD-10-CM | POA: Diagnosis present

## 2024-02-27 LAB — COMPREHENSIVE METABOLIC PANEL WITH GFR
ALT: 26 U/L (ref 0–44)
AST: 17 U/L (ref 15–41)
Albumin: 3.5 g/dL (ref 3.5–5.0)
Alkaline Phosphatase: 65 U/L (ref 38–126)
Anion gap: 12 (ref 5–15)
BUN: 12 mg/dL (ref 6–20)
CO2: 21 mmol/L — ABNORMAL LOW (ref 22–32)
Calcium: 8.9 mg/dL (ref 8.9–10.3)
Chloride: 105 mmol/L (ref 98–111)
Creatinine, Ser: 0.58 mg/dL (ref 0.44–1.00)
GFR, Estimated: >60 mL/min (ref >60)
Glucose, Bld: 119 mg/dL — ABNORMAL HIGH (ref 70–99)
Potassium: 3.7 mmol/L (ref 3.5–5.1)
Sodium: 138 mmol/L (ref 135–145)
Total Bilirubin: 0.4 mg/dL (ref 0.0–1.2)
Total Protein: 7.1 g/dL (ref 6.5–8.1)

## 2024-02-27 LAB — CBC WITH DIFFERENTIAL/PLATELET
Abs Immature Granulocytes: 0.02 K/uL (ref 0.00–0.07)
Basophils Absolute: 0.1 K/uL (ref 0.0–0.1)
Basophils Relative: 1 %
Eosinophils Absolute: 0.1 K/uL (ref 0.0–0.5)
Eosinophils Relative: 1 %
HCT: 39.3 % (ref 36.0–46.0)
Hemoglobin: 13.1 g/dL (ref 12.0–15.0)
Immature Granulocytes: 0 %
Lymphocytes Relative: 27 %
Lymphs Abs: 2.4 K/uL (ref 0.7–4.0)
MCH: 32.3 pg (ref 26.0–34.0)
MCHC: 33.3 g/dL (ref 30.0–36.0)
MCV: 96.8 fL (ref 80.0–100.0)
Monocytes Absolute: 1 K/uL (ref 0.1–1.0)
Monocytes Relative: 11 %
Neutro Abs: 5.2 K/uL (ref 1.7–7.7)
Neutrophils Relative %: 60 %
Platelets: 318 K/uL (ref 150–400)
RBC: 4.06 MIL/uL (ref 3.87–5.11)
RDW: 12.6 % (ref 11.5–15.5)
WBC: 8.8 K/uL (ref 4.0–10.5)
nRBC: 0 % (ref 0.0–0.2)

## 2024-02-27 LAB — HCG, QUANTITATIVE, PREGNANCY: hCG, Beta Chain, Quant, S: <1 m[IU]/mL (ref <5)

## 2024-02-27 MED ORDER — HYDROCODONE-ACETAMINOPHEN 5-325 MG PO TABS: ORAL_TABLET | ORAL | 0 refills | Status: AC

## 2024-02-27 MED ORDER — SODIUM CHLORIDE 0.9 % IV BOLUS
500.0000 mL | Freq: Once | INTRAVENOUS | Status: AC
  Administered 2024-02-27: 500 mL via INTRAVENOUS

## 2024-02-27 MED ORDER — IOHEXOL 300 MG/ML  SOLN
100.0000 mL | Freq: Once | INTRAMUSCULAR | Status: AC | PRN
  Administered 2024-02-27: 100 mL via INTRAVENOUS

## 2024-02-27 MED ORDER — HYDROMORPHONE HCL 1 MG/ML IJ SOLN
0.5000 mg | Freq: Once | INTRAMUSCULAR | Status: AC
  Administered 2024-02-27: 0.5 mg via INTRAVENOUS
  Filled 2024-02-27: qty 0.5
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
## 2024-02-27 NOTE — ED Notes (Signed)
 Patient transported to CT

## 2024-02-27 NOTE — Discharge Instructions (Signed)
Follow-up with your family doctor if any problems 
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
## 2024-02-27 NOTE — ED Notes (Signed)
 ED Provider at bedside.

## 2024-03-01 ENCOUNTER — Emergency Department (HOSPITAL_COMMUNITY)

## 2024-03-01 ENCOUNTER — Encounter (HOSPITAL_COMMUNITY): Payer: Self-pay | Admitting: *Deleted

## 2024-03-01 ENCOUNTER — Other Ambulatory Visit: Payer: Self-pay

## 2024-03-01 ENCOUNTER — Emergency Department (HOSPITAL_COMMUNITY)
Admission: EM | Admit: 2024-03-01 | Discharge: 2024-03-01 | Disposition: A | Discharge status: Home / Self Care | Attending: Emergency Medicine | Admitting: Emergency Medicine

## 2024-03-01 DIAGNOSIS — R079 Chest pain, unspecified: Secondary | ICD-10-CM | POA: Diagnosis present

## 2024-03-01 DIAGNOSIS — R000 Tachycardia, unspecified: Secondary | ICD-10-CM | POA: Insufficient documentation

## 2024-03-01 DIAGNOSIS — R0789 Other chest pain: Secondary | ICD-10-CM | POA: Insufficient documentation

## 2024-03-01 LAB — CBC
HCT: 38 % (ref 36.0–46.0)
Hemoglobin: 12.5 g/dL (ref 12.0–15.0)
MCH: 32.1 pg (ref 26.0–34.0)
MCHC: 32.9 g/dL (ref 30.0–36.0)
MCV: 97.7 fL (ref 80.0–100.0)
Platelets: 285 K/uL (ref 150–400)
RBC: 3.89 MIL/uL (ref 3.87–5.11)
RDW: 12.8 % (ref 11.5–15.5)
WBC: 5.5 K/uL (ref 4.0–10.5)
nRBC: 0 % (ref 0.0–0.2)

## 2024-03-01 LAB — URINALYSIS, ROUTINE W REFLEX MICROSCOPIC
Bacteria, UA: NONE SEEN
Bilirubin Urine: NEGATIVE
Glucose, UA: NEGATIVE mg/dL
Hgb urine dipstick: NEGATIVE
Ketones, ur: NEGATIVE mg/dL
Nitrite: NEGATIVE
Protein, ur: NEGATIVE mg/dL
Specific Gravity, Urine: 1.032 — ABNORMAL HIGH (ref 1.005–1.030)
pH: 7 (ref 5.0–8.0)

## 2024-03-01 LAB — BASIC METABOLIC PANEL WITH GFR
Anion gap: 9 (ref 5–15)
BUN: 9 mg/dL (ref 6–20)
CO2: 26 mmol/L (ref 22–32)
Calcium: 8.9 mg/dL (ref 8.9–10.3)
Chloride: 104 mmol/L (ref 98–111)
Creatinine, Ser: 0.61 mg/dL (ref 0.44–1.00)
GFR, Estimated: >60 mL/min (ref >60)
Glucose, Bld: 113 mg/dL — ABNORMAL HIGH (ref 70–99)
Potassium: 3.9 mmol/L (ref 3.5–5.1)
Sodium: 139 mmol/L (ref 135–145)

## 2024-03-01 LAB — TROPONIN I (HIGH SENSITIVITY)
Troponin I (High Sensitivity): <2 ng/L (ref <18)
Troponin I (High Sensitivity): <2 ng/L (ref <18)

## 2024-03-01 LAB — HCG, QUANTITATIVE, PREGNANCY: hCG, Beta Chain, Quant, S: <1 m[IU]/mL (ref <5)

## 2024-03-01 MED ORDER — ALUM & MAG HYDROXIDE-SIMETH 200-200-20 MG/5ML PO SUSP
30.0000 mL | Freq: Once | ORAL | Status: AC
  Administered 2024-03-01: 30 mL via ORAL
  Filled 2024-03-01: qty 30

## 2024-03-01 MED ORDER — NAPROXEN 375 MG PO TABS: 375.0000 mg | ORAL_TABLET | Freq: Two times a day (BID) | ORAL | 0 refills | Status: AC

## 2024-03-01 MED ORDER — IOHEXOL 350 MG/ML SOLN
75.0000 mL | Freq: Once | INTRAVENOUS | Status: AC | PRN
  Administered 2024-03-01: 75 mL via INTRAVENOUS

## 2024-03-01 MED ORDER — MORPHINE SULFATE (PF) 4 MG/ML IV SOLN
4.0000 mg | Freq: Once | INTRAVENOUS | Status: AC
  Administered 2024-03-01: 4 mg via INTRAVENOUS
  Filled 2024-03-01: qty 1

## 2024-03-01 MED ORDER — SODIUM CHLORIDE 0.9 % IV BOLUS
1000.0000 mL | Freq: Once | INTRAVENOUS | Status: AC
  Administered 2024-03-01: 1000 mL via INTRAVENOUS

## 2024-03-01 NOTE — Discharge Instructions (Signed)
 Urgent care of you today.  You are seen for pain in your chest and coughing up blood.  Fortunately your CT scan and blood work was all reassuring.  There were no sign of blood with your heart, no sign of collapsed lung or other emergent problem.  Your heart rate was slightly fast so we gave you IV fluids.  I think you were probably somewhat dehydrated as your urine was more concentrated than normal.  If you drink plenty of fluids.  Continue taking the Norco as needed but you can also take the naproxen .  Come back if you have new or worsening symptoms, otherwise follow-up closely with your PCP.
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
## 2024-03-04 ENCOUNTER — Telehealth: Admitting: Physician Assistant

## 2024-03-04 DIAGNOSIS — N760 Acute vaginitis: Secondary | ICD-10-CM | POA: Diagnosis not present

## 2024-03-04 MED ORDER — FLUCONAZOLE 150 MG PO TABS: ORAL_TABLET | ORAL | 0 refills | Status: DC

## 2024-03-04 MED ORDER — METRONIDAZOLE 500 MG PO TABS
500.0000 mg | ORAL_TABLET | Freq: Two times a day (BID) | ORAL | 0 refills | Status: AC
Start: 2024-03-04 — End: 2024-03-11

## 2024-03-04 NOTE — Progress Notes (Signed)
 E-Visit for Vaginal Symptoms  We are sorry that you are not feeling well. Here is how we plan to help! Based on what you shared with me it looks like you: May have a vaginosis due to bacteria  Vaginosis is an inflammation of the vagina that can result in discharge, itching and pain. The cause is usually a change in the normal balance of vaginal bacteria or an infection. Vaginosis can also result from reduced estrogen levels after menopause.  The most common causes of vaginosis are:   Bacterial vaginosis which results from an overgrowth of one on several organisms that are normally present in your vagina.   Yeast infections which are caused by a naturally occurring fungus called candida.   Vaginal atrophy (atrophic vaginosis) which results from the thinning of the vagina from reduced estrogen levels after menopause.   Trichomoniasis which is caused by a parasite and is commonly transmitted by sexual intercourse.  Factors that increase your risk of developing vaginosis include: Medications, such as antibiotics and steroids Uncontrolled diabetes Use of hygiene products such as bubble bath, vaginal spray or vaginal deodorant Douching Wearing damp or tight-fitting clothing Using an intrauterine device (IUD) for birth control Hormonal changes, such as those associated with pregnancy, birth control pills or menopause Sexual activity Having a sexually transmitted infection  Your treatment plan is Metronidazole  or Flagyl  500mg  twice a day for 7 days.  I have electronically sent this prescription into the pharmacy that you have chosen. I have also sent a prescription for fluconazole  in case you develop a yeast infection from the antibiotic.  Be sure to take all of the medication as directed. Stop taking any medication if you develop a rash, tongue swelling or shortness of breath. Mothers who are breast feeding should consider pumping and discarding their breast milk while on these antibiotics.  However, there is no consensus that infant exposure at these doses would be harmful.  Remember that medication creams can weaken latex condoms. SABRA   HOME CARE:  Good hygiene may prevent some types of vaginosis from recurring and may relieve some symptoms:  Avoid baths, hot tubs and whirlpool spas. Rinse soap from your outer genital area after a shower, and dry the area well to prevent irritation. Don't use scented or harsh soaps, such as those with deodorant or antibacterial action. Avoid irritants. These include scented tampons and pads. Wipe from front to back after using the toilet. Doing so avoids spreading fecal bacteria to your vagina.  Other things that may help prevent vaginosis include:  Don't douche. Your vagina doesn't require cleansing other than normal bathing. Repetitive douching disrupts the normal organisms that reside in the vagina and can actually increase your risk of vaginal infection. Douching won't clear up a vaginal infection. Use a latex condom. Both female and female latex condoms may help you avoid infections spread by sexual contact. Wear cotton underwear. Also wear pantyhose with a cotton crotch. If you feel comfortable without it, skip wearing underwear to bed. Yeast thrives in [employer] Your symptoms should improve in the next day or two.  GET HELP RIGHT AWAY IF:  You have pain in your lower abdomen ( pelvic area or over your ovaries) You develop nausea or vomiting You develop a fever Your discharge changes or worsens You have persistent pain with intercourse You develop shortness of breath, a rapid pulse, or you faint.  These symptoms could be signs of problems or infections that need to be evaluated by a medical provider  now.  MAKE SURE YOU   Understand these instructions. Will watch your condition. Will get help right away if you are not doing well or get worse.  Thank you for choosing an e-visit.  Your e-visit answers were reviewed by a  board certified advanced clinical practitioner to complete your personal care plan. Depending upon the condition, your plan could have included both over the counter or prescription medications.  Please review your pharmacy choice. Make sure the pharmacy is open so you can pick up prescription now. If there is a problem, you may contact your provider through [employer] and have the prescription routed to another pharmacy.  Your safety is important to us . If you have drug allergies check your prescription carefully.   For the next 24 hours you can use MyChart to ask questions about today's visit, request a non-urgent call back, or ask for a work or school excuse. You will get an email in the next two days asking about your experience. I hope that your e-visit has been valuable and will speed your recovery.  Approximately 5 minutes was spent documenting and reviewing patient's chart.

## 2024-04-18 ENCOUNTER — Other Ambulatory Visit: Payer: Self-pay | Admitting: Medical Genetics

## 2024-04-18 DIAGNOSIS — Z006 Encounter for examination for normal comparison and control in clinical research program: Secondary | ICD-10-CM

## 2024-05-05 ENCOUNTER — Ambulatory Visit: Payer: Self-pay

## 2024-05-06 ENCOUNTER — Encounter: Payer: Self-pay | Admitting: Family Medicine

## 2024-05-06 ENCOUNTER — Telehealth: Admitting: Family Medicine

## 2024-05-06 DIAGNOSIS — R202 Paresthesia of skin: Secondary | ICD-10-CM

## 2024-05-06 DIAGNOSIS — R262 Difficulty in walking, not elsewhere classified: Secondary | ICD-10-CM

## 2024-05-06 DIAGNOSIS — M5442 Lumbago with sciatica, left side: Secondary | ICD-10-CM

## 2024-05-06 NOTE — Progress Notes (Signed)
 Based on what you shared with me, I feel your condition warrants further evaluation as soon as possible at an Emergency department.    NOTE: There will be NO CHARGE for this eVisit   If you are having a true medical emergency please call 911.

## 2024-05-06 NOTE — Progress Notes (Signed)
 NC

## 2024-05-17 ENCOUNTER — Telehealth: Admitting: Family Medicine

## 2024-05-17 DIAGNOSIS — B3731 Acute candidiasis of vulva and vagina: Secondary | ICD-10-CM

## 2024-05-17 DIAGNOSIS — N760 Acute vaginitis: Secondary | ICD-10-CM | POA: Diagnosis not present

## 2024-05-17 DIAGNOSIS — B9689 Other specified bacterial agents as the cause of diseases classified elsewhere: Secondary | ICD-10-CM

## 2024-05-17 MED ORDER — FLUCONAZOLE 150 MG PO TABS: 150.0000 mg | ORAL_TABLET | Freq: Every day | ORAL | 0 refills | Status: AC

## 2024-05-17 MED ORDER — METRONIDAZOLE 500 MG PO TABS: 500.0000 mg | ORAL_TABLET | Freq: Two times a day (BID) | ORAL | 0 refills | Status: AC

## 2024-05-17 NOTE — Progress Notes (Signed)
 We are sorry that you are not feeling well. Here is how we plan to help! Based on what you shared with me it looks like you: May have a vaginosis due to bacteria  Vaginosis is an inflammation of the vagina that can result in discharge, itching and pain. The cause is usually a change in the normal balance of vaginal bacteria or an infection. Vaginosis can also result from reduced estrogen levels after menopause.  The most common causes of vaginosis are:   Bacterial vaginosis which results from an overgrowth of one on several organisms that are normally present in your vagina.   Yeast infections which are caused by a naturally occurring fungus called candida.   Vaginal atrophy (atrophic vaginosis) which results from the thinning of the vagina from reduced estrogen levels after menopause.   Trichomoniasis which is caused by a parasite and is commonly transmitted by sexual intercourse.  Factors that increase your risk of developing vaginosis include: Medications, such as antibiotics and steroids Uncontrolled diabetes Use of hygiene products such as bubble bath, vaginal spray or vaginal deodorant Douching Wearing damp or tight-fitting clothing Using an intrauterine device (IUD) for birth control Hormonal changes, such as those associated with pregnancy, birth control pills or menopause Sexual activity Having a sexually transmitted infection  Your treatment plan is Metronidazole  or Flagyl  500mg  twice a day for 7 days.  I have electronically sent this prescription into the pharmacy that you have chosen. I have also sent diflucan .   Be sure to take all of the medication as directed. Stop taking any medication if you develop a rash, tongue swelling or shortness of breath. Mothers who are breast feeding should consider pumping and discarding their breast milk while on these antibiotics. However, there is no consensus that infant exposure at these doses would be harmful.  Remember that medication  creams can weaken latex condoms.   HOME CARE:  Good hygiene may prevent some types of vaginosis from recurring and may relieve some symptoms:  Avoid baths, hot tubs and whirlpool spas. Rinse soap from your outer genital area after a shower, and dry the area well to prevent irritation. Don't use scented or harsh soaps, such as those with deodorant or antibacterial action. Avoid irritants. These include scented tampons and pads. Wipe from front to back after using the toilet. Doing so avoids spreading fecal bacteria to your vagina.  Other things that may help prevent vaginosis include:  Don't douche. Your vagina doesn't require cleansing other than normal bathing. Repetitive douching disrupts the normal organisms that reside in the vagina and can actually increase your risk of vaginal infection. Douching won't clear up a vaginal infection. Use a latex condom. Both female and female latex condoms may help you avoid infections spread by sexual contact. Wear cotton underwear. Also wear pantyhose with a cotton crotch. If you feel comfortable without it, skip wearing underwear to bed. Yeast thrives in [employer] Your symptoms should improve in the next day or two.  GET HELP RIGHT AWAY IF:  You have pain in your lower abdomen ( pelvic area or over your ovaries) You develop nausea or vomiting You develop a fever Your discharge changes or worsens You have persistent pain with intercourse You develop shortness of breath, a rapid pulse, or you faint.  These symptoms could be signs of problems or infections that need to be evaluated by a medical provider now.  MAKE SURE YOU   Understand these instructions. Will watch your condition. Will get help right  away if you are not doing well or get worse.  Your e-visit answers were reviewed by a board certified advanced clinical practitioner to complete your personal care plan. Depending upon the condition, your plan could have included both over  the counter or prescription medications. Please review your pharmacy choice to make sure that you have choses a pharmacy that is open for you to pick up any needed prescription, Your safety is important to us . If you have drug allergies check your prescription carefully.   You can use MyChart to ask questions about today's visit, request a non-urgent call back, or ask for a work or school excuse for 24 hours related to this e-Visit. If it has been greater than 24 hours you will need to follow up with your provider, or enter a new e-Visit to address those concerns. You will get a MyChart message within the next two days asking about your experience. I hope that your e-visit has been valuable and will speed your recovery.  I have spent 5 minutes in review of e-visit questionnaire, review and updating patient chart, medical decision making and response to patient.   Saraiya Kozma, FNP

## 2024-06-30 ENCOUNTER — Other Ambulatory Visit: Payer: Self-pay | Admitting: Nurse Practitioner

## 2024-06-30 DIAGNOSIS — Z1231 Encounter for screening mammogram for malignant neoplasm of breast: Secondary | ICD-10-CM

## 2024-08-02 ENCOUNTER — Telehealth: Payer: Self-pay | Admitting: Family Medicine

## 2024-08-02 DIAGNOSIS — K047 Periapical abscess without sinus: Secondary | ICD-10-CM

## 2024-08-02 MED ORDER — PENICILLIN V POTASSIUM 500 MG PO TABS: 500.0000 mg | ORAL_TABLET | Freq: Three times a day (TID) | ORAL | 0 refills | Status: AC

## 2024-08-02 MED ORDER — FLUCONAZOLE 150 MG PO TABS: 150.0000 mg | ORAL_TABLET | Freq: Every day | ORAL | 0 refills | Status: DC

## 2024-08-02 MED ORDER — FLUCONAZOLE 150 MG PO TABS: ORAL_TABLET | ORAL | 0 refills | Status: AC

## 2024-08-02 NOTE — Progress Notes (Signed)
 E-Visit for Dental Pain  We are sorry that you are not feeling well.  Here is how we plan to help!  Based on what you have shared with me in the questionnaire, it sounds like you have a dental infection.   Pen VK 500mg  3 times a day for 7 days  It is imperative that you see a dentist within 10 days of this eVisit to determine the cause of the dental pain and be sure it is adequately treated  A toothache or tooth pain is caused when the nerve in the root of a tooth or surrounding a tooth is irritated. Dental (tooth) infection, decay, injury, or loss of a tooth are the most common causes of dental pain. Pain may also occur after an extraction (tooth is pulled out). Pain sometimes originates from other areas and radiates to the jaw, thus appearing to be tooth pain.Bacteria growing inside your mouth can contribute to gum disease and dental decay, both of which can cause pain. A toothache occurs from inflammation of the central portion of the tooth called pulp. The pulp contains nerve endings that are very sensitive to pain. Inflammation to the pulp or pulpitis may be caused by dental cavities, trauma, and infection.    HOME CARE:   For toothaches: Over-the-counter pain medications such as acetaminophen or ibuprofen  may be used. Take these as directed on the package while you arrange for a dental appointment. Avoid very cold or hot foods, because they may make the pain worse. You may get relief from biting on a cotton ball soaked in oil of cloves. You can get oil of cloves at most drug stores.  For jaw pain:  Aspirin may be helpful for problems in the joint of the jaw in adults. If pain happens every time you open your mouth widely, the temporomandibular joint (TMJ) may be the source of the pain. Yawning or taking a large bite of food may worsen the pain. An appointment with your doctor or dentist will help you find the cause.     GET HELP RIGHT AWAY IF:  You have a high fever or chills If  you have had a recent head or face injury and develop headache, light headedness, nausea, vomiting, or other symptoms that concern you after an injury to your face or mouth, you could have a more serious injury in addition to your dental injury. A facial rash associated with a toothache: This condition may improve with medication. Contact your doctor for them to decide what is appropriate. Any jaw pain occurring with chest pain: Although jaw pain is most commonly caused by dental disease, it is sometimes referred pain from other areas. People with heart disease, especially people who have had stents placed, people with diabetes, or those who have had heart surgery may have jaw pain as a symptom of heart attack or angina. If your jaw or tooth pain is associated with lightheadedness, sweating, or shortness of breath, you should see a doctor as soon as possible. Trouble swallowing or excessive pain or bleeding from gums: If you have a history of a weakened immune system, diabetes, or steroid use, you may be more susceptible to infections. Infections can often be more severe and extensive or caused by unusual organisms. Dental and gum infections in people with these conditions may require more aggressive treatment. An abscess may need draining or IV antibiotics, for example.  MAKE SURE YOU   Understand these instructions. Will watch your condition. Will get help right away  if you are not doing well or get worse.  Thank you for choosing an e-visit.  Your e-visit answers were reviewed by a board certified advanced clinical practitioner to complete your personal care plan. Depending upon the condition, your plan could have included both over the counter or prescription medications.  Please review your pharmacy choice. Make sure the pharmacy is open so you can pick up prescription now. If there is a problem, you may contact your provider through [employer] and have the prescription routed to another  pharmacy.  Your safety is important to us . If you have drug allergies check your prescription carefully.   For the next 24 hours you can use MyChart to ask questions about today's visit, request a non-urgent call back, or ask for a work or school excuse. You will get an email in the next two days asking about your experience. I hope that your e-visit has been valuable and will speed your recovery.  I have spent 5 minutes in review of e-visit questionnaire, review and updating patient chart, medical decision making and response to patient.   Anica Alcaraz, FNP

## 2024-08-11 ENCOUNTER — Ambulatory Visit
